# Patient Record
Sex: Female | Born: 1940 | Race: Black or African American | Hispanic: No | Smoking: Former smoker
Health system: Southern US, Community
[De-identification: ages and names within clinical notes are randomized; demographics above are authoritative.]

## PROBLEM LIST (undated history)

## (undated) DIAGNOSIS — J45909 Unspecified asthma, uncomplicated: Secondary | ICD-10-CM

## (undated) DIAGNOSIS — M1990 Unspecified osteoarthritis, unspecified site: Secondary | ICD-10-CM

## (undated) DIAGNOSIS — T8859XA Other complications of anesthesia, initial encounter: Secondary | ICD-10-CM

## (undated) DIAGNOSIS — K7460 Unspecified cirrhosis of liver: Secondary | ICD-10-CM

## (undated) DIAGNOSIS — B1920 Unspecified viral hepatitis C without hepatic coma: Secondary | ICD-10-CM

## (undated) DIAGNOSIS — K219 Gastro-esophageal reflux disease without esophagitis: Secondary | ICD-10-CM

## (undated) DIAGNOSIS — Z8619 Personal history of other infectious and parasitic diseases: Secondary | ICD-10-CM

## (undated) DIAGNOSIS — R5383 Other fatigue: Secondary | ICD-10-CM

## (undated) DIAGNOSIS — M25561 Pain in right knee: Secondary | ICD-10-CM

## (undated) DIAGNOSIS — E119 Type 2 diabetes mellitus without complications: Secondary | ICD-10-CM

## (undated) DIAGNOSIS — I4891 Unspecified atrial fibrillation: Secondary | ICD-10-CM

## (undated) DIAGNOSIS — G4730 Sleep apnea, unspecified: Secondary | ICD-10-CM

## (undated) DIAGNOSIS — F32A Depression, unspecified: Secondary | ICD-10-CM

## (undated) DIAGNOSIS — G4733 Obstructive sleep apnea (adult) (pediatric): Secondary | ICD-10-CM

## (undated) DIAGNOSIS — R079 Chest pain, unspecified: Secondary | ICD-10-CM

## (undated) DIAGNOSIS — R6884 Jaw pain: Secondary | ICD-10-CM

## (undated) DIAGNOSIS — K760 Fatty (change of) liver, not elsewhere classified: Secondary | ICD-10-CM

## (undated) DIAGNOSIS — M25562 Pain in left knee: Secondary | ICD-10-CM

## (undated) DIAGNOSIS — I10 Essential (primary) hypertension: Secondary | ICD-10-CM

## (undated) DIAGNOSIS — M359 Systemic involvement of connective tissue, unspecified: Secondary | ICD-10-CM

## (undated) HISTORY — PX: ABDOMINAL HYSTERECTOMY: SHX81

## (undated) HISTORY — DX: Personal history of other infectious and parasitic diseases: Z86.19

## (undated) HISTORY — DX: Fatty (change of) liver, not elsewhere classified: K76.0

## (undated) HISTORY — DX: Type 2 diabetes mellitus without complications: E11.9

## (undated) HISTORY — DX: Essential (primary) hypertension: I10

## (undated) HISTORY — PX: GALLBLADDER SURGERY: SHX652

## (undated) HISTORY — DX: Unspecified atrial fibrillation: I48.91

## (undated) HISTORY — DX: Sleep apnea, unspecified: G47.30

## (undated) HISTORY — DX: Unspecified osteoarthritis, unspecified site: M19.90

## (undated) HISTORY — DX: Jaw pain: R68.84

## (undated) HISTORY — DX: Other fatigue: R53.83

## (undated) HISTORY — DX: Systemic involvement of connective tissue, unspecified: M35.9

## (undated) HISTORY — DX: Pain in left knee: M25.562

## (undated) HISTORY — PX: EYE SURGERY: SHX253

## (undated) HISTORY — PX: NOSE SURGERY: SHX723

## (undated) HISTORY — PX: OTHER SURGICAL HISTORY: SHX169

## (undated) HISTORY — DX: Unspecified cirrhosis of liver: K74.60

## (undated) HISTORY — DX: Pain in right knee: M25.561

## (undated) HISTORY — PX: APPENDECTOMY: SHX54

## (undated) HISTORY — PX: FOOT SURGERY: SHX648

## (undated) HISTORY — DX: Chest pain, unspecified: R07.9

## (undated) HISTORY — DX: Unspecified viral hepatitis C without hepatic coma: B19.20

## (undated) HISTORY — DX: Obstructive sleep apnea (adult) (pediatric): G47.33

## (undated) HISTORY — DX: Gastro-esophageal reflux disease without esophagitis: K21.9

## (undated) HISTORY — PX: DILATION AND CURETTAGE OF UTERUS: SHX78

---

## 2008-10-26 ENCOUNTER — Encounter: Admission: RE | Admit: 2008-10-26 | Discharge: 2008-10-26 | Payer: Self-pay | Admitting: Internal Medicine

## 2008-12-20 DIAGNOSIS — J302 Other seasonal allergic rhinitis: Secondary | ICD-10-CM | POA: Insufficient documentation

## 2008-12-20 DIAGNOSIS — K219 Gastro-esophageal reflux disease without esophagitis: Secondary | ICD-10-CM | POA: Insufficient documentation

## 2008-12-20 DIAGNOSIS — B1920 Unspecified viral hepatitis C without hepatic coma: Secondary | ICD-10-CM | POA: Insufficient documentation

## 2009-03-28 ENCOUNTER — Ambulatory Visit: Payer: Self-pay | Admitting: Gastroenterology

## 2009-05-09 ENCOUNTER — Ambulatory Visit: Payer: Self-pay | Admitting: Gastroenterology

## 2009-05-21 ENCOUNTER — Ambulatory Visit (HOSPITAL_COMMUNITY): Admission: RE | Admit: 2009-05-21 | Discharge: 2009-05-21 | Payer: Self-pay | Admitting: Gastroenterology

## 2009-05-30 ENCOUNTER — Encounter: Admission: RE | Admit: 2009-05-30 | Discharge: 2009-05-30 | Payer: Self-pay | Admitting: Gastroenterology

## 2009-10-28 ENCOUNTER — Encounter: Admission: RE | Admit: 2009-10-28 | Discharge: 2009-10-28 | Payer: Self-pay | Admitting: Internal Medicine

## 2010-01-21 DIAGNOSIS — H3090 Unspecified chorioretinal inflammation, unspecified eye: Secondary | ICD-10-CM | POA: Insufficient documentation

## 2010-02-04 ENCOUNTER — Ambulatory Visit: Payer: Self-pay | Admitting: Gynecology

## 2010-02-04 ENCOUNTER — Other Ambulatory Visit: Admission: RE | Admit: 2010-02-04 | Discharge: 2010-02-04 | Payer: Self-pay | Admitting: Gynecology

## 2010-02-12 ENCOUNTER — Ambulatory Visit: Payer: Self-pay | Admitting: Gynecology

## 2010-02-18 ENCOUNTER — Ambulatory Visit: Payer: Self-pay | Admitting: Gynecology

## 2010-06-20 LAB — CREATININE, SERUM: GFR calc non Af Amer: >60 mL/min (ref >60)

## 2010-09-29 ENCOUNTER — Other Ambulatory Visit: Payer: Self-pay | Admitting: Internal Medicine

## 2010-09-29 DIAGNOSIS — Z1231 Encounter for screening mammogram for malignant neoplasm of breast: Secondary | ICD-10-CM

## 2010-11-03 ENCOUNTER — Ambulatory Visit
Admission: RE | Admit: 2010-11-03 | Discharge: 2010-11-03 | Disposition: A | Discharge status: Home / Self Care | Payer: PRIVATE HEALTH INSURANCE | Source: Ambulatory Visit | Attending: Internal Medicine | Admitting: Internal Medicine

## 2010-11-03 DIAGNOSIS — Z1231 Encounter for screening mammogram for malignant neoplasm of breast: Secondary | ICD-10-CM

## 2011-10-13 ENCOUNTER — Other Ambulatory Visit: Payer: Self-pay | Admitting: Internal Medicine

## 2011-10-13 DIAGNOSIS — Z1231 Encounter for screening mammogram for malignant neoplasm of breast: Secondary | ICD-10-CM

## 2011-11-09 ENCOUNTER — Ambulatory Visit: Payer: PRIVATE HEALTH INSURANCE

## 2011-11-11 ENCOUNTER — Ambulatory Visit
Admission: RE | Admit: 2011-11-11 | Discharge: 2011-11-11 | Disposition: A | Discharge status: Home / Self Care | Payer: PRIVATE HEALTH INSURANCE | Source: Ambulatory Visit | Attending: Internal Medicine | Admitting: Internal Medicine

## 2011-11-11 DIAGNOSIS — Z1231 Encounter for screening mammogram for malignant neoplasm of breast: Secondary | ICD-10-CM

## 2012-01-09 ENCOUNTER — Emergency Department (HOSPITAL_COMMUNITY)
Admission: EM | Admit: 2012-01-09 | Discharge: 2012-01-09 | Disposition: A | Discharge status: Home / Self Care | Payer: Medicare Other | Source: Home / Self Care

## 2012-01-09 ENCOUNTER — Encounter (HOSPITAL_COMMUNITY): Payer: Self-pay | Admitting: Emergency Medicine

## 2012-01-09 DIAGNOSIS — M25569 Pain in unspecified knee: Secondary | ICD-10-CM

## 2012-01-09 HISTORY — DX: Unspecified osteoarthritis, unspecified site: M19.90

## 2012-01-09 HISTORY — DX: Unspecified asthma, uncomplicated: J45.909

## 2012-01-09 HISTORY — DX: Type 2 diabetes mellitus without complications: E11.9
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
## 2012-01-11 NOTE — ED Provider Notes (Signed)
Medical screening examination/treatment/procedure(s) were performed by non-physician practitioner and as supervising physician I was immediately available for consultation/collaboration.  Leslee Home, M.D.   Reuben Likes, MD 01/11/12 [phone]

## 2012-03-01 ENCOUNTER — Ambulatory Visit (INDEPENDENT_AMBULATORY_CARE_PROVIDER_SITE_OTHER): Payer: Medicare Other | Admitting: Gynecology

## 2012-03-01 ENCOUNTER — Encounter: Payer: Self-pay | Admitting: Gynecology

## 2012-03-01 VITALS — BP 118/70 | Ht 63.0 in | Wt 207.0 lb

## 2012-03-01 DIAGNOSIS — N760 Acute vaginitis: Secondary | ICD-10-CM

## 2012-03-01 DIAGNOSIS — N942 Vaginismus: Secondary | ICD-10-CM

## 2012-03-01 DIAGNOSIS — A499 Bacterial infection, unspecified: Secondary | ICD-10-CM

## 2012-03-01 DIAGNOSIS — N952 Postmenopausal atrophic vaginitis: Secondary | ICD-10-CM | POA: Insufficient documentation

## 2012-03-01 DIAGNOSIS — N898 Other specified noninflammatory disorders of vagina: Secondary | ICD-10-CM

## 2012-03-01 DIAGNOSIS — B9689 Other specified bacterial agents as the cause of diseases classified elsewhere: Secondary | ICD-10-CM

## 2012-03-01 DIAGNOSIS — Z780 Asymptomatic menopausal state: Secondary | ICD-10-CM | POA: Insufficient documentation

## 2012-03-01 LAB — WET PREP FOR TRICH, YEAST, CLUE: Yeast Wet Prep HPF POC: NONE SEEN

## 2012-03-01 MED ORDER — METRONIDAZOLE 0.75 % VA GEL: 1.0000 | Freq: Two times a day (BID) | VAGINAL | Status: DC

## 2012-03-01 NOTE — Progress Notes (Signed)
Jaime Boone 09-May-1940 [id number]   History:    71 y.o.  with a complaint of vaginal atrophy and foul-smelling vaginal discharge. Patient is being followed by Dr.Avva her primary physician who has been following her for her hepatitis C, hypertension, type 2 diabetes and asthma. Patient with prior history of total abdominal hysterectomy. No prior history of abnormal Pap smear. Normal colonoscopy report in 2008. Mammogram August of 2013 normal. Patient in frequently does her self breast examination. Her last bone density study was normal in 2011. Patient is taking calcium and vitamin D. Patient not sexually active.  Past medical history,surgical history, family history and social history were all reviewed and documented in the EPIC chart.  Gynecologic History No LMP recorded. Patient is postmenopausal. Contraception: post menopausal status Last Pap: 2011. Results were: normal Last mammogram: 2013. Results were: normal  Obstetric History OB History    Grav Para Term Preterm Abortions TAB SAB Ect Mult Living   6 3 3  3  3   3      # Outc Date GA Lbr Len/2nd Wgt Sex Del Anes PTL Lv   1 TRM     M SVD  No Yes   2 TRM     F SVD  No Yes   3 TRM     F SVD  No Yes   4 SAB            5 SAB            6 SAB                ROS: A ROS was performed and pertinent positives and negatives are included in the history.  GENERAL: No fevers or chills. HEENT: No change in vision, no earache, sore throat or sinus congestion. NECK: No pain or stiffness. CARDIOVASCULAR: No chest pain or pressure. No palpitations. PULMONARY: No shortness of breath, cough or wheeze. GASTROINTESTINAL: No abdominal pain, nausea, vomiting or diarrhea, melena or bright red blood per rectum. GENITOURINARY: No urinary frequency, urgency, hesitancy or dysuria. MUSCULOSKELETAL: No joint or muscle pain, no back pain, no recent trauma. DERMATOLOGIC: No rash, no itching, no lesions. ENDOCRINE: No polyuria, polydipsia, no heat or cold  intolerance. No recent change in weight. HEMATOLOGICAL: No anemia or easy bruising or bleeding. NEUROLOGIC: No headache, seizures, numbness, tingling or weakness. PSYCHIATRIC: No depression, no loss of interest in normal activity or change in sleep pattern.     Exam: chaperone present  BP 118/70  Ht 5\' 3"  (1.6 m)  Wt 207 lb (93.895 kg)  BMI 36.67 kg/m2  Body mass index is 36.67 kg/(m^2).  General appearance : Well developed well nourished female. No acute distress HEENT: Neck supple, trachea midline, no carotid bruits, no thyroidmegaly Lungs: Clear to auscultation, no rhonchi or wheezes, or rib retractions  Heart: Regular rate and rhythm, no murmurs or gallops Breast:Examined in sitting and supine position were symmetrical in appearance, no palpable masses or tenderness,  no skin retraction, no nipple inversion, no nipple discharge, no skin discoloration, no axillary or supraclavicular lymphadenopathy Abdomen: no palpable masses or tenderness, no rebound or guarding Extremities: no edema or skin discoloration or tenderness  Pelvic:  Bartholin, Urethra, Skene Glands: Within normal limits             Vagina: No gross lesions, yellow foul odor discharge Cervix: Absent  Uterus absent  Adnexa  limited due to her vaginismus  Anus and perineum  normal   Rectovaginal  normal sphincter tone without  palpated masses or tenderness             Hemoccult cards provided   Wet prep: Positive Amine, moderate clue cells moderate WBC too numerous to count bacteria  Assessment/Plan:  71 y.o. female with clinical evidence of bacterial vaginosis. Patient will be placed on MetroGel vaginal cream to apply twice a day for 5 days. For vaginal atrophy she can use the nonhormonal probiotic gel refresh or luvena 2-3 times a week. I have given the patient the name and number of the hepatitis C clinic in [place] whereby the [facility] specialist comes twice a week. She will discuss with her primary physician to  schedule accordingly. She will also discuss with her primary physician if her Tdap and shingles vaccine are up-to-date and she did not recall. She was encouraged to do her monthly self breast examination. Patient will return back in January for an ultrasound to better assess her adnexa due to her vaginismus making incomplete exam today. No Pap smear done today new guidelines discussed. Hemoccult cards provided for the patient to submit to the office for testing a later date.    Ok Edwards MD, 11:24 AM 03/01/2012

## 2012-03-01 NOTE — Addendum Note (Signed)
Addended by: Bertram Savin A on: 03/01/2012 11:39 AM   Modules accepted: Orders

## 2012-03-01 NOTE — Patient Instructions (Addendum)
Tetanus, Diphtheria, Pertussis (Tdap) Vaccine What You Need to Know WHY GET VACCINATED? Tetanus, diphtheria and pertussis can be very serious diseases, even for adolescents and adults. Tdap vaccine can protect us from these diseases. TETANUS (Lockjaw) causes painful muscle tightening and stiffness, usually all over the body.  It can lead to tightening of muscles in the head and neck so you can't open your mouth, swallow, or sometimes even breathe. Tetanus kills about 1 out of 5 people who are infected. DIPHTHERIA can cause a thick coating to form in the back of the throat.  It can lead to breathing problems, paralysis, heart failure, and death. PERTUSSIS (Whooping Cough) causes severe coughing spells, which can cause difficulty breathing, vomiting and disturbed sleep.  It can also lead to weight loss, incontinence, and rib fractures. Up to 2 in 100 adolescents and 5 in 100 adults with pertussis are hospitalized or have complications, which could include pneumonia and death. These diseases are caused by bacteria. Diphtheria and pertussis are spread from person to person through coughing or sneezing. Tetanus enters the body through cuts, scratches, or wounds. Before vaccines, the [place] saw as many as 200,000 cases a year of diphtheria and pertussis, and hundreds of cases of tetanus. Since vaccination began, tetanus and diphtheria have dropped by about 99% and pertussis by about 80%. TDAP VACCINE Tdap vaccine can protect adolescents and adults from tetanus, diphtheria, and pertussis. One dose of Tdap is routinely given at age 11 or 12. People who did not get Tdap at that age should get it as soon as possible. Tdap is especially important for health care professionals and anyone having close contact with a baby younger than 12 months. Pregnant women should get a dose of Tdap during every pregnancy, to protect the newborn from pertussis. Infants are most at risk for severe, life-threatening  complications from pertussis. A similar vaccine, called Td, protects from tetanus and diphtheria, but not pertussis. A Td booster should be given every 10 years. Tdap may be given as one of these boosters if you have not already gotten a dose. Tdap may also be given after a severe cut or burn to prevent tetanus infection. Your doctor can give you more information. Tdap may safely be given at the same time as other vaccines. SOME PEOPLE SHOULD NOT GET THIS VACCINE  If you ever had a life-threatening allergic reaction after a dose of any tetanus, diphtheria, or pertussis containing vaccine, OR if you have a severe allergy to any part of this vaccine, you should not get Tdap. Tell your doctor if you have any severe allergies.  If you had a coma, or long or multiple seizures within 7 days after a childhood dose of DTP or DTaP, you should not get Tdap, unless a cause other than the vaccine was found. You can still get Td.  Talk to your doctor if you:  have epilepsy or another nervous system problem,  had severe pain or swelling after any vaccine containing diphtheria, tetanus or pertussis,  ever had Guillain-Barr Syndrome (GBS),  aren't feeling well on the day the shot is scheduled. RISKS OF A VACCINE REACTION With any medicine, including vaccines, there is a chance of side effects. These are usually mild and go away on their own, but serious reactions are also possible. Brief fainting spells can follow a vaccination, leading to injuries from falling. Sitting or lying down for about 15 minutes can help prevent these. Tell your doctor if you feel dizzy or light-headed, or   have vision changes or ringing in the ears. Mild problems following Tdap (Did not interfere with activities)  Pain where the shot was given (about 3 in 4 adolescents or 2 in 3 adults)  Redness or swelling where the shot was given (about 1 person in 5)  Mild fever of at least 100.4F (up to about 1 in 25 adolescents or 1 in  100 adults)  Headache (about 3 or 4 people in 10)  Tiredness (about 1 person in 3 or 4)  Nausea, vomiting, diarrhea, stomach ache (up to 1 in 4 adolescents or 1 in 10 adults)  Chills, body aches, sore joints, rash, swollen glands (uncommon) Moderate problems following Tdap (Interfered with activities, but did not require medical attention)  Pain where the shot was given (about 1 in 5 adolescents or 1 in 100 adults)  Redness or swelling where the shot was given (up to about 1 in 16 adolescents or 1 in 25 adults)  Fever over 102F (about 1 in 100 adolescents or 1 in 250 adults)  Headache (about 3 in 20 adolescents or 1 in 10 adults)  Nausea, vomiting, diarrhea, stomach ache (up to 1 or 3 people in 100)  Swelling of the entire arm where the shot was given (up to about 3 in 100). Severe problems following Tdap (Unable to perform usual activities, required medical attention)  Swelling, severe pain, bleeding and redness in the arm where the shot was given (rare). A severe allergic reaction could occur after any vaccine (estimated less than 1 in a million doses). WHAT IF THERE IS A SERIOUS REACTION? What should I look for?  Look for anything that concerns you, such as signs of a severe allergic reaction, very high fever, or behavior changes. Signs of a severe allergic reaction can include hives, swelling of the face and throat, difficulty breathing, a fast heartbeat, dizziness, and weakness. These would start a few minutes to a few hours after the vaccination. What should I do?  If you think it is a severe allergic reaction or other emergency that can't wait, call 9-1-1 or get the person to the nearest hospital. Otherwise, call your doctor.  Afterward, the reaction should be reported to the "Vaccine Adverse Event Reporting System" (VAERS). Your doctor might file this report, or you can do it yourself through the VAERS web site at www.vaers.hhs.gov, or by calling 1-800-822-7967. VAERS is  only for reporting reactions. They do not give medical advice.  THE NATIONAL VACCINE INJURY COMPENSATION PROGRAM The National Vaccine Injury Compensation Program (VICP) is a federal program that was created to compensate people who may have been injured by certain vaccines. Persons who believe they may have been injured by a vaccine can learn about the program and about filing a claim by calling 1-800-338-2382 or visiting the VICP website at www.hrsa.gov/vaccinecompensation. HOW CAN I LEARN MORE?  Ask your doctor.  Call your local or state health department.  Contact the Centers for Disease Control and Prevention (CDC):  Call 1-800-232-4636 or visit CDC's website at www.cdc.gov/vaccines CDC Tdap Vaccine VIS (08/06/11) Document Released: 09/15/2011 Document Reviewed: 09/15/2011 ExitCare Patient Information 2013 [employer].  Shingles Vaccine What You Need to Know WHAT IS SHINGLES?  Shingles is a painful skin rash, often with blisters. It is also called Herpes Zoster or just Zoster.  A shingles rash usually appears on one side of the face or body and lasts from 2 to 4 weeks. Its main symptom is pain, which can be quite severe. Other symptoms of   shingles can include fever, headache, chills, and upset stomach. Very rarely, a shingles infection can lead to pneumonia, hearing problems, blindness, brain inflammation (encephalitis), or death.  For about 1 person in 5, severe pain can continue even after the rash clears up. This is called post-herpetic neuralgia.  Shingles is caused by the Varicella Zoster virus. This is the same virus that causes chickenpox. Only someone who has had a case of chickenpox or rarely, has gotten chickenpox vaccine, can get shingles. The virus stays in your body. It can reappear many years later to cause a case of shingles.  You cannot catch shingles from another person with shingles. However, a person who has never had chickenpox (or chickenpox vaccine) could get  chickenpox from someone with shingles. This is not very common.  Shingles is far more common in people 50 and older than in younger people. It is also more common in people whose immune systems are weakened because of a disease such as cancer or drugs such as steroids or chemotherapy.  At least 1 million people get shingles per year in the [place]. SHINGLES VACCINE  A vaccine for shingles was licensed in 2006. In clinical trials, the vaccine reduced the risk of shingles by 50%. It can also reduce the pain in people who still get shingles after being vaccinated.  A single dose of shingles vaccine is recommended for adults 60 years of age and older. SOME PEOPLE SHOULD NOT GET SHINGLES VACCINE OR SHOULD WAIT A person should not get shingles vaccine if he or she:  Has ever had a life-threatening allergic reaction to gelatin, the antibiotic neomycin, or any other component of shingles vaccine. Tell your caregiver if you have any severe allergies.  Has a weakened immune system because of current:  AIDS or another disease that affects the immune system.  Treatment with drugs that affect the immune system, such as prolonged use of high-dose steroids.  Cancer treatment, such as radiation or chemotherapy.  Cancer affecting the bone marrow or lymphatic system, such as leukemia or lymphoma.  Is pregnant, or might be pregnant. Women should not become pregnant until at least 4 weeks after getting shingles vaccine. Someone with a minor illness, such as a cold, may be vaccinated. Anyone with a moderate or severe acute illness should usually wait until he or she recovers before getting the vaccine. This includes anyone with a temperature of 101.3 F (38 C) or higher. WHAT ARE THE RISKS FROM SHINGLES VACCINE?  A vaccine, like any medicine, could possibly cause serious problems, such as severe allergic reactions. However, the risk of a vaccine causing serious harm, or death, is extremely  small.  No serious problems have been identified with shingles vaccine. Mild Problems  Redness, soreness, swelling, or itching at the site of the injection (about 1 person in 3).  Headache (about 1 person in 70). Like all vaccines, shingles vaccine is being closely monitored for unusual or severe problems. WHAT IF THERE IS A MODERATE OR SEVERE REACTION? What should I look for? Any unusual condition, such as a severe allergic reaction or a high fever. If a severe allergic reaction occurred, it would be within a few minutes to an hour after the shot. Signs of a serious allergic reaction can include difficulty breathing, weakness, hoarseness or wheezing, a fast heartbeat, hives, dizziness, paleness, or swelling of the throat. What should I do?  Call your caregiver, or get the person to a caregiver right away.  Tell the caregiver what happened,   the date and time it happened, and when the vaccination was given.  Ask the caregiver to report the reaction by filing a Vaccine Adverse Event Reporting System (VAERS) form. Or, you can file this report through the VAERS web site at www.vaers.hhs.gov or by calling 1-800-822-7967. VAERS does not provide medical advice. HOW CAN I LEARN MORE?  Ask your caregiver. He or she can give you the vaccine package insert or suggest other sources of information.  Contact the Centers for Disease Control and Prevention (CDC):  Call 1-800-232-4636 (1-800-CDC-INFO).  Visit the CDC website at www.cdc.gov/vaccines CDC Shingles Vaccine VIS (01/03/08) Document Released: 01/11/2006 Document Revised: 06/08/2011 Document Reviewed: 01/03/2008 ExitCare Patient Information 2013 [employer].   

## 2012-03-01 NOTE — Addendum Note (Signed)
Addended by: Ok Edwards on: 03/01/2012 11:32 AM   Modules accepted: Orders

## 2012-03-11 ENCOUNTER — Encounter: Payer: Self-pay | Admitting: Gynecology

## 2012-04-06 ENCOUNTER — Ambulatory Visit (INDEPENDENT_AMBULATORY_CARE_PROVIDER_SITE_OTHER): Payer: Medicare Other

## 2012-04-06 ENCOUNTER — Encounter: Payer: Self-pay | Admitting: Gynecology

## 2012-04-06 ENCOUNTER — Ambulatory Visit (INDEPENDENT_AMBULATORY_CARE_PROVIDER_SITE_OTHER): Payer: Medicare Other | Admitting: Gynecology

## 2012-04-06 VITALS — BP 118/70

## 2012-04-06 DIAGNOSIS — K5900 Constipation, unspecified: Secondary | ICD-10-CM | POA: Insufficient documentation

## 2012-04-06 DIAGNOSIS — N952 Postmenopausal atrophic vaginitis: Secondary | ICD-10-CM

## 2012-04-06 DIAGNOSIS — Z8619 Personal history of other infectious and parasitic diseases: Secondary | ICD-10-CM

## 2012-04-06 DIAGNOSIS — N942 Vaginismus: Secondary | ICD-10-CM

## 2012-04-06 DIAGNOSIS — N907 Vulvar cyst: Secondary | ICD-10-CM | POA: Insufficient documentation

## 2012-04-06 DIAGNOSIS — N83339 Acquired atrophy of ovary and fallopian tube, unspecified side: Secondary | ICD-10-CM

## 2012-04-06 DIAGNOSIS — Z780 Asymptomatic menopausal state: Secondary | ICD-10-CM

## 2012-04-06 DIAGNOSIS — N9489 Other specified conditions associated with female genital organs and menstrual cycle: Secondary | ICD-10-CM

## 2012-04-06 NOTE — Patient Instructions (Addendum)
Constipation, Adult Constipation is when a person has fewer than 3 bowel movements a week; has difficulty having a bowel movement; or has stools that are dry, hard, or larger than normal. As people grow older, constipation is more common. If you try to fix constipation with medicines that make you have a bowel movement (laxatives), the problem may get worse. Long-term laxative use may cause the muscles of the colon to become weak. A low-fiber diet, not taking in enough fluids, and taking certain medicines may make constipation worse. CAUSES   Certain medicines, such as antidepressants, pain medicine, iron supplements, antacids, and water pills.   Certain diseases, such as diabetes, irritable bowel syndrome (IBS), thyroid disease, or depression.   Not drinking enough water.   Not eating enough fiber-rich foods.   Stress or travel.  Lack of physical activity or exercise.  Not going to the restroom when there is the urge to have a bowel movement.  Ignoring the urge to have a bowel movement.  Using laxatives too much. SYMPTOMS   Having fewer than 3 bowel movements a week.   Straining to have a bowel movement.   Having hard, dry, or larger than normal stools.   Feeling full or bloated.   Pain in the lower abdomen.  Not feeling relief after having a bowel movement. DIAGNOSIS  Your caregiver will take a medical history and perform a physical exam. Further testing may be done for severe constipation. Some tests may include:   A barium enema X-ray to examine your rectum, colon, and sometimes, your small intestine.  A sigmoidoscopy to examine your lower colon.  A colonoscopy to examine your entire colon. TREATMENT  Treatment will depend on the severity of your constipation and what is causing it. Some dietary treatments include drinking more fluids and eating more fiber-rich foods. Lifestyle treatments may include regular exercise. If these diet and lifestyle recommendations  do not help, your caregiver may recommend taking over-the-counter laxative medicines to help you have bowel movements. Prescription medicines may be prescribed if over-the-counter medicines do not work.  HOME CARE INSTRUCTIONS   Increase dietary fiber in your diet, such as fruits, vegetables, whole grains, and beans. Limit high-fat and processed sugars in your diet, such as [place] fries, hamburgers, cookies, candies, and soda.   A fiber supplement may be added to your diet if you cannot get enough fiber from foods.   Drink enough fluids to keep your urine clear or pale yellow.   Exercise regularly or as directed by your caregiver.   Go to the restroom when you have the urge to go. Do not hold it.  Only take medicines as directed by your caregiver. Do not take other medicines for constipation without talking to your caregiver first. SEEK IMMEDIATE MEDICAL CARE IF:   You have bright red blood in your stool.   Your constipation lasts for more than 4 days or gets worse.   You have abdominal or rectal pain.   You have thin, pencil-like stools.  You have unexplained weight loss. MAKE SURE YOU:   Understand these instructions.  Will watch your condition.  Will get help right away if you are not doing well or get worse. Document Released: 12/13/2003 Document Revised: 06/08/2011 Document Reviewed: 02/17/2011 [facility] Patient Information 2013 [place], [place].  Epidermal Cyst An epidermal cyst is sometimes called a sebaceous cyst, epidermal inclusion cyst, or infundibular cyst. These cysts usually contain a substance that looks "pasty" or "cheesy" and may have a bad smell. This  substance is a protein called keratin. Epidermal cysts are usually found on the face, neck, or trunk. They may also occur in the vaginal area or other parts of the genitalia of both men and women. Epidermal cysts are usually small, painless, slow-growing bumps or lumps that move freely under the skin. It is  important not to try to pop them. This may cause an infection and lead to tenderness and swelling. CAUSES  Epidermal cysts may be caused by a deep penetrating injury to the skin or a plugged hair follicle, often associated with acne. SYMPTOMS  Epidermal cysts can become inflamed and cause:  Redness.  Tenderness.  Increased temperature of the skin over the bumps or lumps.  Grayish-white, bad smelling material that drains from the bump or lump. DIAGNOSIS  Epidermal cysts are easily diagnosed by your caregiver during an exam. Rarely, a tissue sample (biopsy) may be taken to rule out other conditions that may resemble epidermal cysts. TREATMENT   Epidermal cysts often get better and disappear on their own. They are rarely ever cancerous.  If a cyst becomes infected, it may become inflamed and tender. This may require opening and draining the cyst. Treatment with antibiotics may be necessary. When the infection is gone, the cyst may be removed with minor surgery.  Small, inflamed cysts can often be treated with antibiotics or by injecting steroid medicines.  Sometimes, epidermal cysts become large and bothersome. If this happens, surgical removal in your caregiver's office may be necessary. HOME CARE INSTRUCTIONS  Only take over-the-counter or prescription medicines as directed by your caregiver.  Take your antibiotics as directed. Finish them even if you start to feel better. SEEK MEDICAL CARE IF:   Your cyst becomes tender, red, or swollen.  Your condition is not improving or is getting worse.  You have any other questions or concerns. MAKE SURE YOU:  Understand these instructions.  Will watch your condition.  Will get help right away if you are not doing well or get worse. Document Released: 02/15/2004 Document Revised: 06/08/2011 Document Reviewed: 09/22/2010 [facility] Patient Information 2013 [place], [place].

## 2012-04-06 NOTE — Progress Notes (Signed)
Patient's a 72 year old who was seen in the office as a new patient on 03/02/2011. Patient was having vaginal discharge and was treated for bacterial vaginosis. 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 Patient not sexually active and was complaining of a "bump" in her labia majora.  Exam: Bartholin urethra Skene was within normal limits Inferior aspect of the left labia majora appears to be a small nonerythematous sebaceous cyst gain ready to drain on its own. Mildly tender and measures approximately 1-1/2 cm in size.  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 Right ovary not seen. Left ovary not seen.   Patient is being followed by Dr.Avva her primary physician who has been following her for her hepatitis C, hypertension, type 2 diabetes and asthma. Patient with prior history of total abdominal hysterectomy. No prior history of abnormal Pap smear. Normal colonoscopy report in 2008. Mammogram August of 2013 normal.  Patient is not currently seen a liver specialist or gastroenterologist. [place]. I'm going to refer her to one of our gastroenterologist in the community for followup. We will otherwise see her back in one year for gynecological exams or when necessary. Patient will do sitz baths at home to allow the cyst to drain on its on. I've given some samples of Neosporin and she can apply twice a day for the next 5-7 days. If the area becomes bigger or begins to drain she should return to the office whereby we may need to do an I&D. As for her constipation I've given her samples of MiraLax and instructed to take 1 tablespoon daily and to increase her diet with fiber-containing products.

## 2012-04-14 ENCOUNTER — Telehealth: Payer: Self-pay | Admitting: *Deleted

## 2012-04-14 ENCOUNTER — Ambulatory Visit: Payer: Medicare Other

## 2012-04-14 ENCOUNTER — Ambulatory Visit (INDEPENDENT_AMBULATORY_CARE_PROVIDER_SITE_OTHER): Payer: Medicare Other

## 2012-04-14 DIAGNOSIS — Z13820 Encounter for screening for osteoporosis: Secondary | ICD-10-CM

## 2012-04-14 NOTE — Telephone Encounter (Signed)
She is doing of the right things and had a normal ultrasound here in the office recently. I would recommend that she followup with her gastroenterologist who did her colonoscopy in 2008 for further evaluation

## 2012-04-14 NOTE — Telephone Encounter (Signed)
Pt informed with the below note and will follow with GI doctor.

## 2012-04-14 NOTE — Telephone Encounter (Signed)
Pt was given MiraLax take 1 tablespoon daily and told to increase her diet with fiber-containing products on OV 04/06/12. Pt took MiraLax and has increased fiber containing food and still feels bloated. Pt is also taking Caltrate calcium 600 daily she read this could be related to her problem. She is not having constipation issues, she does have bowel movements but doesn't feel like enough bowel comes out. She asked if taking fiber pills would be better? Or whatever your recommendation would be. Please advise

## 2012-05-31 DIAGNOSIS — D649 Anemia, unspecified: Secondary | ICD-10-CM | POA: Insufficient documentation

## 2012-10-13 ENCOUNTER — Other Ambulatory Visit: Payer: Self-pay

## 2012-10-13 DIAGNOSIS — Z1231 Encounter for screening mammogram for malignant neoplasm of breast: Secondary | ICD-10-CM

## 2012-11-11 ENCOUNTER — Ambulatory Visit
Admission: RE | Admit: 2012-11-11 | Discharge: 2012-11-11 | Disposition: A | Discharge status: Home / Self Care | Payer: Medicare Other | Source: Ambulatory Visit

## 2012-11-11 DIAGNOSIS — Z1231 Encounter for screening mammogram for malignant neoplasm of breast: Secondary | ICD-10-CM

## 2013-01-24 DIAGNOSIS — E039 Hypothyroidism, unspecified: Secondary | ICD-10-CM | POA: Insufficient documentation

## 2013-03-02 ENCOUNTER — Encounter: Payer: Self-pay | Admitting: Gynecology

## 2013-03-02 ENCOUNTER — Ambulatory Visit (INDEPENDENT_AMBULATORY_CARE_PROVIDER_SITE_OTHER): Payer: Medicare Other | Admitting: Gynecology

## 2013-03-02 VITALS — BP 124/72 | Ht 63.0 in | Wt 206.0 lb

## 2013-03-02 DIAGNOSIS — N952 Postmenopausal atrophic vaginitis: Secondary | ICD-10-CM

## 2013-03-02 DIAGNOSIS — Z23 Encounter for immunization: Secondary | ICD-10-CM

## 2013-03-02 DIAGNOSIS — N644 Mastodynia: Secondary | ICD-10-CM

## 2013-03-02 NOTE — Progress Notes (Signed)
Jaime Boone 05/24/1940 [id number]   History:    72 y.o. c/o bilateral breast tenderness. Short lived and comes and goes. One time in past three months. Mammogram normal August 2014.Patient is being followed by Dr.Avva her primary physician who has been following her for her hepatitis C, hypertension, type 2 diabetes and asthma. Patient with prior history of total abdominal hysterectomy. No prior history of abnormal Pap smear. Normal colonoscopy report in 2008. Mammogram August of 2013 normal. Patient in frequently does her self breast examination.Patient is taking calcium and vitamin D. Patient not sexually active. Normal bone density January 2014.   Past medical history,surgical history, family history and social history were all reviewed and documented in the EPIC chart.  Gynecologic History No LMP recorded. Patient is postmenopausal. Contraception: status post hysterectomy Last Pap: 2011. Results were: normal Last mammogram: 2014. Results were: normal  Obstetric History OB History  Gravida Para Term Preterm AB SAB TAB Ectopic Multiple Living  6 3 3  3 3    3     # Outcome Date GA Lbr Len/2nd Weight Sex Delivery Anes PTL Lv  6 SAB           5 SAB           4 SAB           3 TRM     F SVD  N Y  2 TRM     F SVD  N Y  1 TRM     M SVD  N Y       ROS: A ROS was performed and pertinent positives and negatives are included in the history.  GENERAL: No fevers or chills. HEENT: No change in vision, no earache, sore throat or sinus congestion. NECK: No pain or stiffness. CARDIOVASCULAR: No chest pain or pressure. No palpitations. PULMONARY: No shortness of breath, cough or wheeze. GASTROINTESTINAL: No abdominal pain, nausea, vomiting or diarrhea, melena or bright red blood per rectum. GENITOURINARY: No urinary frequency, urgency, hesitancy or dysuria. MUSCULOSKELETAL: No joint or muscle pain, no back pain, no recent trauma. DERMATOLOGIC: No rash, no itching, no lesions. ENDOCRINE: No  polyuria, polydipsia, no heat or cold intolerance. No recent change in weight. HEMATOLOGICAL: No anemia or easy bruising or bleeding. NEUROLOGIC: No headache, seizures, numbness, tingling or weakness. PSYCHIATRIC: No depression, no loss of interest in normal activity or change in sleep pattern.     Exam: chaperone present  BP 124/72  Ht 5\' 3"  (1.6 m)  Wt 206 lb (93.441 kg)  BMI 36.50 kg/m2  Body mass index is 36.5 kg/(m^2).  General appearance : Well developed well nourished female. No acute distress HEENT: Neck supple, trachea midline, no carotid bruits, no thyroidmegaly Lungs: Clear to auscultation, no rhonchi or wheezes, or rib retractions  Heart: Regular rate and rhythm, no murmurs or gallops Breast:Examined in sitting and supine position were symmetrical in appearance, no palpable masses or tenderness,  no skin retraction, no nipple inversion, no nipple discharge, no skin discoloration, no axillary or supraclavicular lymphadenopathy Abdomen: no palpable masses or tenderness, no rebound or guarding Extremities: no edema or skin discoloration or tenderness  Pelvic:  Bartholin, Urethra, Skene Glands: Within normal limits             Vagina:atrophy  Cervix: Absent  Uterus absent  Adnexa  Without masses or tenderness  Anus and perineum  normal   Rectovaginal  normal sphincter tone without palpated masses or tenderness  Hemoccult PCP     Assessment/Plan:  72 y.o. female who has very infrequent bilateral mastodynia. She states she consumes  approximately16 ounces of soda daily along with ice tea and coffee. We discussed importance of cutting down on her caffeine intake to minimize her breast tenderness. Her mammogram recently was normal. We encouraged her to continue to do her monthly breast exam. Her PCP we'll be drawing her blood work. She received the Tdap vaccine today. In the next 2 weeks she will receive her Pneumovax as well as her shingles vaccine at her PCP  office.  Note: This dictation was prepared with  Dragon/digital dictation along withSmart phrase technology. Any transcriptional errors that result from this process are unintentional.   Ok Edwards MD, 10:56 AM 03/02/2013

## 2013-04-11 ENCOUNTER — Other Ambulatory Visit: Payer: Self-pay | Admitting: Internal Medicine

## 2013-04-11 DIAGNOSIS — C220 Liver cell carcinoma: Secondary | ICD-10-CM

## 2013-04-13 ENCOUNTER — Ambulatory Visit
Admission: RE | Admit: 2013-04-13 | Discharge: 2013-04-13 | Disposition: A | Discharge status: Home / Self Care | Payer: Medicare HMO | Source: Ambulatory Visit | Attending: Internal Medicine | Admitting: Internal Medicine

## 2013-04-13 DIAGNOSIS — C220 Liver cell carcinoma: Secondary | ICD-10-CM

## 2013-04-19 ENCOUNTER — Encounter: Payer: Self-pay | Admitting: Internal Medicine

## 2013-04-19 DIAGNOSIS — R109 Unspecified abdominal pain: Secondary | ICD-10-CM | POA: Insufficient documentation

## 2013-04-19 DIAGNOSIS — K7460 Unspecified cirrhosis of liver: Secondary | ICD-10-CM | POA: Insufficient documentation

## 2013-05-12 ENCOUNTER — Encounter: Payer: Self-pay | Admitting: Internal Medicine

## 2013-05-12 ENCOUNTER — Ambulatory Visit (INDEPENDENT_AMBULATORY_CARE_PROVIDER_SITE_OTHER): Payer: Medicare HMO | Admitting: Internal Medicine

## 2013-05-12 VITALS — BP 110/70 | HR 72 | Ht 63.0 in | Wt 205.0 lb

## 2013-05-12 DIAGNOSIS — E1165 Type 2 diabetes mellitus with hyperglycemia: Secondary | ICD-10-CM

## 2013-05-12 DIAGNOSIS — R1084 Generalized abdominal pain: Secondary | ICD-10-CM

## 2013-05-12 DIAGNOSIS — R1011 Right upper quadrant pain: Secondary | ICD-10-CM

## 2013-05-12 DIAGNOSIS — IMO0001 Reserved for inherently not codable concepts without codable children: Secondary | ICD-10-CM

## 2013-05-12 DIAGNOSIS — B1920 Unspecified viral hepatitis C without hepatic coma: Secondary | ICD-10-CM

## 2013-05-12 DIAGNOSIS — G8929 Other chronic pain: Secondary | ICD-10-CM

## 2013-05-12 DIAGNOSIS — K7460 Unspecified cirrhosis of liver: Secondary | ICD-10-CM

## 2013-05-12 NOTE — Patient Instructions (Signed)

## 2013-05-12 NOTE — Progress Notes (Signed)
HISTORY OF PRESENT ILLNESS:  Jaime Boone is a 73 y.o. 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 Patient is sent today regarding recent problems with abdominal pain and the need for surveillance endoscopy (screening for varices). She moved to [facility] from [place] around September 2009. She reports having had colonoscopy in 2005 and again in 2008. Last examination, by her report, was negative with followup in 10 years recommended. She also underwent upper endoscopy, by her report, 2009. Reportedly unremarkable. She does have hepatitis C. Previously seen by the medical specialties clinic from [facility]. Now, the medical specialties clinic from [place]. They have requested EGD to screen for varices. The patient's recent right upper quadrant pain was musculoskeletal in nature. Ultrasound revealed a hemangioma and course hepatic architecture. No other abnormalities. She does have bloating and gas. Also reported some episode of sharp left lower quadrant pain that radiated to her right upper quadrant in her right arm and hand. Lasted seconds. GI review of systems is otherwise negative. She is status post cholecystectomy. Review of outside records including office note from Dr. Dagmar Hait, laboratories, and x-rays completed. Of note, recent hemoglobin 11.7 with platelet count of 333,000. Liver tests normal except for ALT of 42. Normal albumin of 3.5.  REVIEW OF SYSTEMS:  All non-GI ROS negative except for sinus and allergy, arthritis, back pain, headaches, itching, shortness of breath, increased thirst, excessive urination  Past Medical History  Diagnosis Date  . Diabetes mellitus without complication   . Asthma   . Arthritis   . Hypertension   . Acid reflux   . Hepatitis C   . Degenerative joint disease   . 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 Cirrhosis     Past Surgical History  Procedure Laterality Date  .  Gallbladder surgery    . Foot surgery      BUNIONECTOMY  . Nose surgery    . Eye surgery      CATARACT  . Dilation and curettage of uterus    . Appendectomy    . Abdominal hysterectomy      TAH    Social History Jaime Boone  reports that she quit smoking about 39 years ago. She has never used smokeless tobacco. She reports that she does not drink alcohol or use illicit drugs.  family history includes Cancer in her brother, cousin, and father; Diabetes in her father, mother, and sister; Heart disease in her brother and father; Hypertension in her father and mother.  Allergies  Allergen Reactions  . Aspirin   . Codeine   . Penicillins Swelling    THROAT       PHYSICAL EXAMINATION: Vital signs: BP 110/70  Pulse 72  Ht 5\' 3"  (1.6 m)  Wt 205 lb (92.987 kg)  BMI 36.32 kg/m2  Constitutional: generally well-appearing, no acute distress Psychiatric: alert and oriented x3, cooperative Eyes: extraocular movements intact, anicteric, conjunctiva pink Mouth: oral pharynx moist, no lesions Neck: supple no lymphadenopathy Cardiovascular: heart regular rate and rhythm, no murmur Lungs: clear to auscultation bilaterally Abdomen: soft, nontender, nondistended, no obvious ascites, no peritoneal signs, normal bowel sounds, no organomegaly Extremities: no lower extremity edema bilaterally Skin: no lesions on visible extremities Neuro: No focal deficits. No asterixis.   ASSESSMENT:  #1. Recent right-sided pain resolved. Most likely musculoskeletal. #2. 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 No evidence of decompensated liver disease. Laboratory profile favorable. Being followed by medical specialties clinic #3. Colon cancer screening.  Up-to-date per patient report. Last exam 2008 #4. Multiple medical problems including insulin requiring diabetes  PLAN:  #1. Schedule surveillance upper endoscopy.The nature of the procedure, as well as the risks, benefits, and alternatives were carefully  and thoroughly reviewed with the patient. Ample time for discussion and questions allowed. The patient understood, was satisfied, and agreed to proceed. The patient is higher than baseline risk due to her comorbidities and diabetes. #2. Give one half p.m. insulin and hold a.m. Glucotrol for the examination #3. Continue general medical care with Dr. Dagmar Hait and liver care the of the [facility] medical specialties clinic personnel

## 2013-05-16 ENCOUNTER — Encounter: Payer: Self-pay | Admitting: Internal Medicine

## 2013-06-21 ENCOUNTER — Encounter: Payer: Medicare HMO | Admitting: Internal Medicine

## 2013-09-27 ENCOUNTER — Encounter: Payer: Self-pay | Admitting: Neurology

## 2013-10-03 ENCOUNTER — Ambulatory Visit (INDEPENDENT_AMBULATORY_CARE_PROVIDER_SITE_OTHER): Payer: Medicare HMO | Admitting: Neurology

## 2013-10-03 ENCOUNTER — Encounter: Payer: Self-pay | Admitting: Neurology

## 2013-10-03 VITALS — BP 122/74 | HR 67 | Temp 97.6°F | Ht 63.0 in | Wt 206.0 lb

## 2013-10-03 DIAGNOSIS — G4733 Obstructive sleep apnea (adult) (pediatric): Secondary | ICD-10-CM

## 2013-10-03 DIAGNOSIS — E663 Overweight: Secondary | ICD-10-CM

## 2013-10-03 DIAGNOSIS — R351 Nocturia: Secondary | ICD-10-CM

## 2013-10-03 NOTE — Progress Notes (Signed)
Subjective:    Patient ID: Jaime Boone is a 73 y.o. female.  HPI    Star Age, MD, PhD [facility] [address], [address] [place], [place] [address]  Dear Dr. Dagmar Hait,   I saw your patient, Jaime Boone, upon your kind request in my neurologic clinic today for initial consultation of her sleep disorder, in particular her prior diagnosis of obstructive sleep apnea. The patient is unaccompanied today. As you know, Jaime Boone is a 73 year old right-handed woman with an underlying medical history of hepatitis C, reflux disease, hypertension, asthma, arthritis, diabetes with associated retinopathy and nephropathy, questionable liver cirrhosis, connective tissue disease with 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 and degenerative joint disease, who was diagnosed with obstructive sleep apnea in 2006 and was given a CPAP machine but did not and that using it because she was apprehensive and afraid to use it. She presents for reevaluation and possible treatment. She complains of snoring and some HAs, not AM HAs. She would be willing to try CPAP. She sleeps on her sides and her stomach as she is afraid to sleep on her back for fear of breathing issues. She lives alone. She does not keep as sleep-wake schedule and goes to bed between 8:30 and MN and falls asleep fairly quickly but wakes up several times in the night. She has nocturia x 3 per night. She has no RLS symptoms. She was told at the time of the sleep test that she stopped breathing 19 times per hour. She wakes up between 6:30 and 7 AM. She is not a napper. She drinks one cup of coffee a day. She drinks alcohol occasionally. She quit smoking in '75. Her ESS is 5 today. She has no significant post-nasal drip or GERD at night. She snores and has had gasping for air. She does not sleep with anyone at this time.  She denies cataplexy, sleep paralysis, hypnagogic or hypnopompic hallucinations, or sleep attacks. She does not  report any vivid dreams, nightmares, dream enactments, or parasomnias, such as sleep talking or sleep walking. The patient has not had a sleep study or a home sleep test since the first time. She has no pets.   Her Past Medical History Is Significant For: Past Medical History  Diagnosis Date  . Diabetes mellitus without complication   . Asthma   . Arthritis   . Hypertension   . Acid reflux   . Hepatitis C   . Degenerative joint disease   . 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 Cirrhosis     Her Past Surgical History Is Significant For: Past Surgical History  Procedure Laterality Date  . Gallbladder surgery    . Foot surgery      BUNIONECTOMY  . Nose surgery    . Eye surgery      CATARACT  . Dilation and curettage of uterus    . Appendectomy    . Abdominal hysterectomy      TAH    Her Family History Is Significant For: Family History  Problem Relation Age of Onset  . Hypertension Mother   . Diabetes Mother   . Cancer Father     PROSTATE  . Hypertension Father   . Diabetes Father   . Heart disease Father   . Diabetes Sister   . Heart disease Brother   . Cancer Brother     PROSTATE  . Cancer Cousin     LUNG    Her Social  History Is Significant For: History   Social History  . Marital Status: Divorced    Spouse Name: N/A    Number of Children: 3  . Years of Education: N/A   Occupational History  . Retired    Social History Main Topics  . Smoking status: Former Smoker    Quit date: 03/01/1974  . Smokeless tobacco: Never Used  . Alcohol Use: No  . Drug Use: No  . Sexual Activity: No   Other Topics Concern  . None   Social History Narrative  . None    Her Allergies Are:  Allergies  Allergen Reactions  . Aspirin   . Codeine   . Penicillins Swelling    THROAT  :   Her Current Medications Are:  Outpatient Encounter Prescriptions as of 10/03/2013  Medication Sig  . BD PEN NEEDLE NANO U/F 32G X 4 MM MISC   .  Calcium Carbonate-Vitamin D (CALTRATE 600+D) 600-400 MG-UNIT per tablet Take 1 tablet by mouth daily.  . fluticasone (FLONASE) 50 MCG/ACT nasal spray   . glipiZIDE (GLUCOTROL) 10 MG tablet Take 10 mg by mouth 2 (two) times daily before a meal.  . insulin glargine (LANTUS) 100 UNIT/ML injection Inject into the skin at bedtime.  Marland Kitchen losartan (COZAAR) 100 MG tablet Take 100 mg by mouth daily.  . montelukast (SINGULAIR) 10 MG tablet Take 10 mg by mouth at bedtime.  Marland Kitchen omeprazole (PRILOSEC) 20 MG capsule Take 20 mg by mouth daily.  . ONE TOUCH ULTRA TEST test strip   . PROAIR HFA 108 (90 BASE) MCG/ACT inhaler   . triamcinolone cream (KENALOG) 0.1 % Apply 1 application topically as needed.  . vitamin E 600 UNIT capsule Take 600 Units by mouth daily.  Marland Kitchen levothyroxine (SYNTHROID, LEVOTHROID) 50 MCG tablet   . [DISCONTINUED] PRESCRIPTION MEDICATION DRUG FOR THYROID ? NAME  :  Review of Systems:  Out of a complete 14 point review of systems, all are reviewed and negative with the exception of these symptoms as listed below:  Review of Systems  Constitutional: Negative.   HENT: Positive for tinnitus.   Eyes: Positive for pain and visual disturbance (blurred vision).  Respiratory: Positive for shortness of breath.   Cardiovascular: Negative.   Gastrointestinal: Negative.   Endocrine: Negative.   Genitourinary: Negative.   Musculoskeletal: Negative.   Skin: Positive for rash.  Allergic/Immunologic: Negative.   Neurological: Positive for headaches.  Hematological:       Anemia  Psychiatric/Behavioral: Positive for sleep disturbance (snoring, restless leg).    Objective:  Neurologic Exam  Physical Exam Physical Examination:   Filed Vitals:   10/03/13 0856  BP: 122/74  Pulse: 67  Temp: 97.6 F (36.4 C)    General Examination: The patient is a very pleasant 73 y.o. female in no acute distress. She appears well-developed and well-nourished and well groomed. She is overweight.   HEENT:  Normocephalic, atraumatic, pupils are equal, round and reactive to light and accommodation, s/p L cataract repair. Funduscopic exam is normal with sharp disc margins noted. Extraocular tracking is good without limitation to gaze excursion or nystagmus noted. Normal smooth pursuit is noted. Hearing is grossly intact. Tympanic membranes are clear bilaterally. Face is symmetric with normal facial animation and normal facial sensation. Speech is clear with no dysarthria noted. There is no hypophonia. There is no lip, neck/head, jaw or voice tremor. Neck is supple with full range of passive and active motion. There are no carotid bruits on auscultation. Oropharynx exam reveals:  mild mouth dryness, adequate dental hygiene with multiple missing teeth on the bottom, and moderate airway crowding, due to tonsils in place and larger tongue. She has a small uvula.  Mallampati is class I. Tongue protrudes centrally and palate elevates symmetrically. Tonsils are 1+ in size. Neck size is 14.25 inches. She has a very small overbite. Nasal inspection reveals no significant nasal mucosal bogginess or redness and no septal deviation.   Chest: Clear to auscultation without wheezing, rhonchi or crackles noted.  Heart: S1+S2+0, regular and normal without murmurs, rubs or gallops noted.   Abdomen: Soft, non-tender and non-distended with normal bowel sounds appreciated on auscultation.  Extremities: There is no pitting edema in the distal lower extremities bilaterally. Pedal pulses are intact.  Skin: Warm and dry without trophic changes noted. There are no varicose veins.  Musculoskeletal: exam reveals no obvious joint deformities, tenderness or joint swelling or erythema.   Neurologically:  Mental status: The patient is awake, alert and oriented in all 4 spheres. Her immediate and remote memory, attention, language skills and fund of knowledge are appropriate. There is no evidence of aphasia, agnosia, apraxia or anomia.  Speech is clear with normal prosody and enunciation. Thought process is linear. Mood is normal and affect is normal.  Cranial nerves II - XII are as described above under HEENT exam. In addition: shoulder shrug is normal with equal shoulder height noted. Motor exam: Normal bulk, strength and tone is noted. There is no drift, tremor or rebound. Romberg is negative. Reflexes are 2+ throughout. Babinski: Toes are flexor bilaterally. Fine motor skills and coordination: intact with normal finger taps, normal hand movements, normal rapid alternating patting, normal foot taps and normal foot agility.  Cerebellar testing: No dysmetria or intention tremor on finger to nose testing. Heel to shin is unremarkable bilaterally. There is no truncal or gait ataxia.  Sensory exam: intact to light touch, pinprick, vibration, temperature sense in the upper and lower extremities.  Gait, station and balance: She stands with mild difficulty, d/t back stiffness reported. No veering to one side is noted. No leaning to one side is noted. Posture is age-appropriate and stance is narrow based. Gait shows normal stride length and normal pace. No problems turning are noted. She turns en bloc. Tandem walk is unremarkable. Intact toe and heel stance is noted.               Assessment and Plan:   In summary, Jaime Boone is a very pleasant 73 y.o.-year old female with an underlying medical history of hepatitis C, reflux disease, hypertension, asthma, arthritis, diabetes with associated retinopathy and nephropathy, questionable liver cirrhosis, connective tissue disease with 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 and degenerative joint disease, who was diagnosed with obstructive sleep apnea in 2006. Her history and physical exam concerning for obstructive sleep apnea (OSA). She is willing to embark on re-evaluation and potential.  I had a long chat with the patient about my findings and the diagnosis of OSA, its prognosis and treatment  options. We talked about medical treatments, surgical interventions and non-pharmacological approaches. I explained in particular the risks and ramifications of untreated moderate to severe OSA, especially with respect to developing cardiovascular disease down the Road, including congestive heart failure, difficult to treat hypertension, cardiac arrhythmias, or stroke. Even type 2 diabetes has, in part, been linked to untreated OSA. Symptoms of untreated OSA include daytime sleepiness, memory problems, mood irritability and mood disorder such as depression and anxiety, lack of energy, as well as recurrent headaches,  especially morning headaches. We talked about trying to maintain a healthy lifestyle in general, as well as the importance of weight control. I encouraged the patient to eat healthy, exercise daily and keep well hydrated, to keep a scheduled bedtime and wake time routine, to not skip any meals and eat healthy snacks in between meals. I advised the patient not to drive when feeling sleepy.  I recommended the following at this time: sleep study with potential positive airway pressure titration.  I explained the sleep test procedure to the patient and also outlined possible surgical and non-surgical treatment options of OSA, including the use of a custom-made dental device (which would require a referral to a specialist dentist or oral surgeon), upper airway surgical options, such as pillar implants, radiofrequency surgery, tongue base surgery, and UPPP (which would involve a referral to an ENT surgeon). Rarely, jaw surgery such as mandibular advancement may be considered.  I also explained the CPAP treatment option to the patient, who indicated that she would be willing to try CPAP if the need arises. I explained the importance of being compliant with PAP treatment, not only for insurance purposes but primarily to improve Her symptoms, and for the patient's long term health benefit, including to reduce  Her cardiovascular risks. I answered all her questions today and the patient was in agreement. I would like to see her back after the sleep study is completed and encouraged her to call with any interim questions, concerns, problems or updates.   Thank you very much for allowing me to participate in the care of this nice patient. If I can be of any further assistance to you please do not hesitate to call me at [phone].  Sincerely,   Star Age, MD, PhD

## 2013-10-10 ENCOUNTER — Other Ambulatory Visit: Payer: Self-pay

## 2013-10-10 DIAGNOSIS — Z1231 Encounter for screening mammogram for malignant neoplasm of breast: Secondary | ICD-10-CM

## 2013-11-13 ENCOUNTER — Ambulatory Visit
Admission: RE | Admit: 2013-11-13 | Discharge: 2013-11-13 | Disposition: A | Discharge status: Home / Self Care | Payer: Medicare HMO | Source: Ambulatory Visit

## 2013-11-13 DIAGNOSIS — Z1231 Encounter for screening mammogram for malignant neoplasm of breast: Secondary | ICD-10-CM

## 2014-01-05 ENCOUNTER — Other Ambulatory Visit (HOSPITAL_COMMUNITY): Payer: Self-pay | Admitting: Nurse Practitioner

## 2014-01-05 DIAGNOSIS — B182 Chronic viral hepatitis C: Secondary | ICD-10-CM

## 2014-01-23 ENCOUNTER — Ambulatory Visit (HOSPITAL_COMMUNITY)
Admission: RE | Admit: 2014-01-23 | Discharge: 2014-01-23 | Disposition: A | Discharge status: Home / Self Care | Payer: Medicare HMO | Source: Ambulatory Visit | Attending: Nurse Practitioner | Admitting: Nurse Practitioner

## 2014-01-23 DIAGNOSIS — B182 Chronic viral hepatitis C: Secondary | ICD-10-CM | POA: Diagnosis present

## 2014-01-29 ENCOUNTER — Encounter: Payer: Self-pay | Admitting: Neurology

## 2014-03-05 ENCOUNTER — Other Ambulatory Visit (HOSPITAL_COMMUNITY)
Admission: RE | Admit: 2014-03-05 | Discharge: 2014-03-05 | Disposition: A | Discharge status: Home / Self Care | Payer: Medicare Other | Source: Ambulatory Visit | Attending: Gynecology | Admitting: Gynecology

## 2014-03-05 ENCOUNTER — Ambulatory Visit (INDEPENDENT_AMBULATORY_CARE_PROVIDER_SITE_OTHER): Payer: Medicare Other | Admitting: Gynecology

## 2014-03-05 ENCOUNTER — Encounter: Payer: Self-pay | Admitting: Gynecology

## 2014-03-05 VITALS — BP 110/70 | Ht 62.5 in | Wt 212.0 lb

## 2014-03-05 DIAGNOSIS — N952 Postmenopausal atrophic vaginitis: Secondary | ICD-10-CM

## 2014-03-05 DIAGNOSIS — N951 Menopausal and female climacteric states: Secondary | ICD-10-CM

## 2014-03-05 DIAGNOSIS — Z8619 Personal history of other infectious and parasitic diseases: Secondary | ICD-10-CM | POA: Diagnosis not present

## 2014-03-05 DIAGNOSIS — Z1151 Encounter for screening for human papillomavirus (HPV): Secondary | ICD-10-CM | POA: Diagnosis present

## 2014-03-05 DIAGNOSIS — Z124 Encounter for screening for malignant neoplasm of cervix: Secondary | ICD-10-CM | POA: Insufficient documentation

## 2014-03-05 DIAGNOSIS — Z1272 Encounter for screening for malignant neoplasm of vagina: Secondary | ICD-10-CM | POA: Diagnosis not present

## 2014-03-05 NOTE — Patient Instructions (Signed)
Shingles Vaccine What You Need to Know WHAT IS SHINGLES?  Shingles is a painful skin rash, often with blisters. It is also called Herpes Zoster or just Zoster.  A shingles rash usually appears on one side of the face or body and lasts from 2 to 4 weeks. Its main symptom is pain, which can be quite severe. Other symptoms of shingles can include fever, headache, chills, and upset stomach. Very rarely, a shingles infection can lead to pneumonia, hearing problems, blindness, brain inflammation (encephalitis), or death.  For about 1 person in 5, severe pain can continue even after the rash clears up. This is called post-herpetic neuralgia.  Shingles is caused by the Varicella Zoster virus. This is the same virus that causes chickenpox. Only someone who has had a case of chickenpox or rarely, has gotten chickenpox vaccine, can get shingles. The virus stays in your body. It can reappear many years later to cause a case of shingles.  You cannot catch shingles from another person with shingles. However, a person who has never had chickenpox (or chickenpox vaccine) could get chickenpox from someone with shingles. This is not very common.  Shingles is far more common in people 50 and older than in younger people. It is also more common in people whose immune systems are weakened because of a disease such as cancer or drugs such as steroids or chemotherapy.  At least 1 million people get shingles per year in the [place]. SHINGLES VACCINE  A vaccine for shingles was licensed in 2006. In clinical trials, the vaccine reduced the risk of shingles by 50%. It can also reduce the pain in people who still get shingles after being vaccinated.  A single dose of shingles vaccine is recommended for adults 60 years of age and older. SOME PEOPLE SHOULD NOT GET SHINGLES VACCINE OR SHOULD WAIT A person should not get shingles vaccine if he or she:  Has ever had a life-threatening allergic reaction to gelatin, the  antibiotic neomycin, or any other component of shingles vaccine. Tell your caregiver if you have any severe allergies.  Has a weakened immune system because of current:  AIDS or another disease that affects the immune system.  Treatment with drugs that affect the immune system, such as prolonged use of high-dose steroids.  Cancer treatment, such as radiation or chemotherapy.  Cancer affecting the bone marrow or lymphatic system, such as leukemia or lymphoma.  Is pregnant, or might be pregnant. Women should not become pregnant until at least 4 weeks after getting shingles vaccine. Someone with a minor illness, such as a cold, may be vaccinated. Anyone with a moderate or severe acute illness should usually wait until he or she recovers before getting the vaccine. This includes anyone with a temperature of 101.3 F (38 C) or higher. WHAT ARE THE RISKS FROM SHINGLES VACCINE?  A vaccine, like any medicine, could possibly cause serious problems, such as severe allergic reactions. However, the risk of a vaccine causing serious harm, or death, is extremely small.  No serious problems have been identified with shingles vaccine. Mild Problems  Redness, soreness, swelling, or itching at the site of the injection (about 1 person in 3).  Headache (about 1 person in 70). Like all vaccines, shingles vaccine is being closely monitored for unusual or severe problems. WHAT IF THERE IS A MODERATE OR SEVERE REACTION? What should I look for? Any unusual condition, such as a severe allergic reaction or a high fever. If a severe allergic reaction   occurred, it would be within a few minutes to an hour after the shot. Signs of a serious allergic reaction can include difficulty breathing, weakness, hoarseness or wheezing, a fast heartbeat, hives, dizziness, paleness, or swelling of the throat. What should I do?  Call your caregiver, or get the person to a caregiver right away.  Tell the caregiver what  happened, the date and time it happened, and when the vaccination was given.  Ask the caregiver to report the reaction by filing a Vaccine Adverse Event Reporting System (VAERS) form. Or, you can file this report through the VAERS web site at www.vaers.hhs.gov or by calling 1-800-822-7967. VAERS does not provide medical advice. HOW CAN I LEARN MORE?  Ask your caregiver. He or she can give you the vaccine package insert or suggest other sources of information.  Contact the Centers for Disease Control and Prevention (CDC):  Call 1-800-232-4636 (1-800-CDC-INFO).  Visit the CDC website at www.cdc.gov/vaccines CDC Shingles Vaccine VIS (01/03/08) Document Released: 01/11/2006 Document Revised: 06/08/2011 Document Reviewed: 07/06/2012 ExitCare Patient Information 2015 [employer]. This information is not intended to replace advice given to you by your health care provider. Make sure you discuss any questions you have with your health care provider.  
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
## 2014-03-05 NOTE — Addendum Note (Signed)
Addended by: Thurnell Garbe A on: 03/05/2014 11:28 AM   Modules accepted: Orders

## 2014-03-07 LAB — CYTOLOGY - PAP

## 2014-03-19 ENCOUNTER — Other Ambulatory Visit: Payer: Self-pay | Admitting: Internal Medicine

## 2014-03-19 DIAGNOSIS — R1310 Dysphagia, unspecified: Secondary | ICD-10-CM

## 2014-03-21 ENCOUNTER — Ambulatory Visit
Admission: RE | Admit: 2014-03-21 | Discharge: 2014-03-21 | Disposition: A | Discharge status: Home / Self Care | Payer: Medicare Other | Source: Ambulatory Visit | Attending: Internal Medicine | Admitting: Internal Medicine

## 2014-03-21 DIAGNOSIS — R1310 Dysphagia, unspecified: Secondary | ICD-10-CM

## 2014-04-26 ENCOUNTER — Other Ambulatory Visit: Payer: Self-pay | Admitting: Gynecology

## 2014-04-26 ENCOUNTER — Ambulatory Visit (INDEPENDENT_AMBULATORY_CARE_PROVIDER_SITE_OTHER): Payer: Medicare Other

## 2014-04-26 DIAGNOSIS — Z13820 Encounter for screening for osteoporosis: Secondary | ICD-10-CM

## 2014-04-26 DIAGNOSIS — N951 Menopausal and female climacteric states: Secondary | ICD-10-CM

## 2014-05-03 ENCOUNTER — Other Ambulatory Visit: Payer: Self-pay | Admitting: Gynecology

## 2014-05-03 DIAGNOSIS — N951 Menopausal and female climacteric states: Secondary | ICD-10-CM

## 2014-05-03 DIAGNOSIS — Z13820 Encounter for screening for osteoporosis: Secondary | ICD-10-CM

## 2014-05-10 DIAGNOSIS — E11319 Type 2 diabetes mellitus with unspecified diabetic retinopathy without macular edema: Secondary | ICD-10-CM | POA: Insufficient documentation

## 2014-08-23 DIAGNOSIS — F39 Unspecified mood [affective] disorder: Secondary | ICD-10-CM | POA: Insufficient documentation

## 2014-10-08 ENCOUNTER — Other Ambulatory Visit: Payer: Self-pay

## 2014-10-08 DIAGNOSIS — Z1231 Encounter for screening mammogram for malignant neoplasm of breast: Secondary | ICD-10-CM

## 2014-10-09 ENCOUNTER — Ambulatory Visit: Payer: Medicaid Other | Attending: Orthopedic Surgery | Admitting: Physical Therapy

## 2014-10-09 DIAGNOSIS — M2560 Stiffness of unspecified joint, not elsewhere classified: Secondary | ICD-10-CM | POA: Insufficient documentation

## 2014-10-09 DIAGNOSIS — M5386 Other specified dorsopathies, lumbar region: Secondary | ICD-10-CM

## 2014-10-09 DIAGNOSIS — R29898 Other symptoms and signs involving the musculoskeletal system: Secondary | ICD-10-CM | POA: Insufficient documentation

## 2014-10-09 DIAGNOSIS — M545 Low back pain, unspecified: Secondary | ICD-10-CM

## 2014-10-09 DIAGNOSIS — M25551 Pain in right hip: Secondary | ICD-10-CM

## 2014-10-09 DIAGNOSIS — M62830 Muscle spasm of back: Secondary | ICD-10-CM | POA: Diagnosis present

## 2014-10-09 NOTE — Patient Instructions (Signed)
   Chyanne Kohut PT, DPT, LAT, ATC  [facility] Phone: [phone]     

## 2014-10-09 NOTE — Therapy (Signed)
[place], [place], [address] Phone: [phone]   Fax:  [phone]  Physical Therapy Evaluation  Patient Details  Name: Jaime Boone MRN: [id number] Date of Birth: 10/18/1940 Referring Provider:  Rod Can, MD  Encounter Date: 10/09/2014      PT End of Session - 10/09/14 0935    Visit Number 1   Number of Visits 12   Date for PT Re-Evaluation 11/20/14   Authorization Type Medicare / medicaid secondary   PT Start Time 0835   PT Stop Time 0928   PT Time Calculation (min) 53 min   Activity Tolerance Patient tolerated treatment well   Behavior During Therapy [facility] for tasks assessed/performed      Past Medical History  Diagnosis Date  . Diabetes mellitus without complication   . Asthma   . Arthritis   . Hypertension   . Acid reflux   . Hepatitis C   . Degenerative joint disease   . 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 Cirrhosis     Past Surgical History  Procedure Laterality Date  . Gallbladder surgery    . Foot surgery      BUNIONECTOMY  . Nose surgery    . Eye surgery      CATARACT  . Dilation and curettage of uterus    . Appendectomy    . Abdominal hysterectomy      TAH    There were no vitals filed for this visit.  Visit Diagnosis:  Midline low back pain without sciatica - Plan: PT plan of care cert/re-cert  Decreased ROM of lumbar spine - Plan: PT plan of care cert/re-cert  Muscle spasm of back - Plan: PT plan of care cert/re-cert  Right hip pain - Plan: PT plan of care cert/re-cert  Weakness of both legs - Plan: PT plan of care cert/re-cert      Subjective Assessment - 10/09/14 0849    Subjective pt is a 74 y.o F with CC of low back pain and R hip bursitis. The hip started from a fall in 2013 after landing on the R hip, and fell again in April on the R hip. the low back pain has going on for a long time. She reports symptoms have been  staying consistent since onset.    Limitations Sitting;Lifting;Standing;Walking;House hold activities   How long can you sit comfortably? 1 hour but some discomfort   How long can you stand comfortably? 10-15   How long can you walk comfortably? 10-15   Diagnostic tests X-ray 09/22/2014 low back degenerative, hip in April per pt report and no bony abnormalties    Patient Stated Goals to be pain free, to be able to walk and exercise and do daily activities   Currently in Pain? Yes   Pain Score 3    Pain Location Back   Pain Orientation Mid;Lower   Pain Descriptors / Indicators Sharp;Stabbing   Pain Type Chronic pain   Pain Onset More than a month ago   Pain Frequency Constant   Aggravating Factors  bending forward, prolong standing and walking   Pain Relieving Factors luke warm water   Multiple Pain Sites Yes   Pain Score 4   Pain Location Hip   Pain Orientation Right   Pain Descriptors / Indicators Aching;Sore   Pain Type Chronic pain   Pain Onset More than a month ago   Pain Frequency Constant   Aggravating Factors  prolong standing, walking   Pain Relieving Factors luke warm water            [facility] PT Assessment - 10/09/14 0900    Assessment   Medical Diagnosis low back pain, and R hip bursitis   Onset Date/Surgical Date --  low back a couple years, R hip last couple months   Hand Dominance Right   Next MD Visit PRN   Prior Therapy yes  low back   Precautions   Precautions None   Restrictions   Weight Bearing Restrictions No   Balance Screen   Has the patient fallen in the past 6 months Yes   How many times? 1   Has the patient had a decrease in activity level because of a fear of falling?  Yes   Is the patient reluctant to leave their home because of a fear of falling?  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 assessed   Observation/Other Assessments   Focus on Therapeutic Outcomes (FOTO)  55% limitation   predicted 43% limitation   Posture/Postural Control   Posture/Postural Control Postural limitations   Postural Limitations Rounded Shoulders;Forward head;Increased lumbar lordosis   ROM / Strength   AROM / PROM / Strength AROM;Strength   AROM   AROM Assessment Site Lumbar;Hip   Right/Left Hip Right;Left   Lumbar Flexion 35   Lumbar Extension 15   Lumbar - Right Side Bend 28   Lumbar - Left Side Bend 30   Lumbar - Right Rotation 15%   Lumbar - Left Rotation 25%   Strength   Strength Assessment Site Hip;Knee   Right/Left Hip Right;Left   Right Hip Flexion 3+/5   Right Hip Extension 4-/5   Right Hip ABduction 3+/5   Right Hip ADduction 3+/5   Left Hip Flexion 3+/5   Left Hip Extension 4-/5   Left Hip ABduction 3+/5   Left Hip ADduction 3+/5   Right/Left Knee Right;Left   Right Knee Flexion 4-/5   Right Knee Extension 4-/5   Left Knee Flexion 4-/5   Left Knee Extension 4-/5   Palpation   Spinal mobility hypomobility with P/A L1-L5 with pain upon palpation    Palpation comment tendneress at the greater trochanter, L glutes and bil lumbar paraspinals   Special Tests    Special Tests Lumbar;Hip Special Tests   Lumbar Tests Prone Knee Bend Test;Straight Leg Raise;FABER test   Hip Special Tests  Ober's Test;Hip Scouring   FABER test   findings Negative   Side --  bil   Prone Knee Bend Test   Findings Negative   Side --  bil   Straight  Leg Raise   Findings Negative   Side  --  bil   Hip Scouring   Findings Negative   Side --  bil                   [facility] Adult PT Treatment/Exercise - 10/09/14 0900  Lumbar Exercises: Stretches   Active Hamstring Stretch 2 reps;30 seconds   Single Knee to Chest Stretch 2 reps;30 seconds   Lower Trunk Rotation 5 reps;10 seconds   Pelvic Tilt 5 reps;10 seconds   ITB Stretch 1 rep;30 seconds  standing with HHA from chair                PT Education - 10/09/14 0934    Education provided Yes   Education Details evaluation findings, POC, HEP, goals, anatomical education   Person(s) Educated Patient   Methods Explanation   Comprehension Verbalized understanding          PT Short Term Goals - 10/09/14 0947    PT SHORT TERM GOAL #1   Title pt will be I with basic HEP (10/30/2014)   Time 3   Period Weeks   Status New   PT SHORT TERM GOAL #2   Title pt will increase her FOTO score by > 5 points to demonstrate increased functional capacity (10/30/2014)   Time 3   Period Weeks   Status New   PT SHORT TERM GOAL #3   Title she will be able to verbalize and demonstrate techniques to reduce hip/low back reinjury via postural awareness, lifting and carrying mechanics and HEP (10/30/2014)   Time 3   Period Weeks   Status New           PT Long Term Goals - 10/09/14 4403    PT LONG TERM GOAL #1   Title upon discharge pt will be I with all HEP given throughout therapy (11/20/2014)   Time 6   Period Weeks   Status New   PT LONG TERM GOAL #2   Title pt will increase bil LE strenght to >4/5 to assist walking endurance and safety (11/20/2014)   Time 6   Period Weeks   Status New   PT LONG TERM GOAL #3   Title she will demontrate < 2/10 pain during and following prolonged walking/standing for > 30 min, and ascending/descend > 12 steps to help with community amb (11/20/2014)   Time 6   Period Weeks   Status New   PT LONG TERM GOAL #4   Title She will increase  trunk mobility in all planes by > 10 degrees to help with ADLs (11/20/2014)   Time 6   Period Weeks   Status New   PT LONG TERM GOAL #5   Title  pt will demonstate FOTO score of 57 to demonstrate improved functional capacity upon discharge (11/20/2014)    Time 6   Period Weeks   Status New               Plan - 10/09/14 0935    Clinical Impression Statement Jaime Boone presents to OPPT with CC of low back pain thats been going on for a while and R hip pain thats been going on for a couple of months secondary to 2 falls onto her R hip. She demonstrates limited strength in bil LE rated a 3+/5 which pt reports history of knees giving way. She demonstrates limited trunk mobility with increased pain at end range in all planes with increased soreness during R sided rotation and sidebending. she demontrates tendneress at the greater trochanter, L glutes and bil lumbar paraspinals, with hypomobility of L1-L5. She would benefit from skilled physical therapy to maximize her function by addressing the impairments listed below.    Pt will benefit from skilled therapeutic intervention in order to improve on  the following deficits Decreased activity tolerance;Decreased endurance;Decreased balance;Hypomobility;Decreased strength;Decreased range of motion;Impaired flexibility;Pain;Improper body mechanics;Postural dysfunction;Decreased mobility   Rehab Potential Good   PT Frequency 2x / week   PT Duration 6 weeks   PT Treatment/Interventions ADLs/Self Care Home Management;Cryotherapy;Electrical Stimulation;Iontophoresis 4mg /ml Dexamethasone;Moist Heat;Ultrasound;Therapeutic activities;Therapeutic exercise;Balance training;Neuromuscular re-education;Patient/family education;Manual techniques;Dry needling;Taping   PT Next Visit Plan assess response to HEP, STM for hip, and bil LE strengthening,spine mobilization, assess hip AROM, and berg balance.    PT Home Exercise Plan see HEP handout   Consulted and Agree with  Plan of Care Patient          G-Codes - October 14, 2014 0929    Functional Assessment Tool Used FOTO    Functional Limitation Changing and maintaining body position   Changing and Maintaining Body Position Current Status [phone]) At least 40 percent but less than 60 percent impaired, limited or restricted   Changing and Maintaining Body Position Goal Status (V2023) At least 20 percent but less than 40 percent impaired, limited or restricted       Problem List Patient Active Problem List   Diagnosis Date Noted  . Sebaceous cyst of labia 04/06/2012  . History of hepatitis C 04/06/2012  . Constipation 04/06/2012  . Postmenopausal 03/01/2012  . Vaginal atrophy 03/01/2012   Starr Lake PT, DPT, LAT, ATC  10-14-14  9:58 AM    [facility] [address] [place], [place], [address] Phone: [phone]   Fax:  [phone]

## 2014-10-15 ENCOUNTER — Ambulatory Visit: Payer: Medicaid Other | Admitting: Physical Therapy

## 2014-10-15 DIAGNOSIS — M545 Low back pain, unspecified: Secondary | ICD-10-CM

## 2014-10-15 DIAGNOSIS — M5386 Other specified dorsopathies, lumbar region: Secondary | ICD-10-CM

## 2014-10-15 DIAGNOSIS — M62830 Muscle spasm of back: Secondary | ICD-10-CM

## 2014-10-15 DIAGNOSIS — R29898 Other symptoms and signs involving the musculoskeletal system: Secondary | ICD-10-CM

## 2014-10-15 DIAGNOSIS — M25551 Pain in right hip: Secondary | ICD-10-CM

## 2014-10-15 NOTE — Therapy (Signed)
Jaime Boone, [place], [address] Phone: [phone]   Fax:  [phone]  Physical Therapy Treatment  Patient Details  Name: Jaime Boone MRN: [id number] Date of Birth: May 10, 1940 Referring Provider:  Prince Solian, MD  Encounter Date: 10/15/2014      PT End of Session - 10/15/14 1216    Visit Number 2   Number of Visits 12   Date for PT Re-Evaluation 11/20/14   PT Start Time 1130   PT Stop Time 1217   PT Time Calculation (min) 47 min   Activity Tolerance Patient tolerated treatment well   Behavior During Therapy [facility] for tasks assessed/performed      Past Medical History  Diagnosis Date  . Diabetes mellitus without complication   . Asthma   . Arthritis   . Hypertension   . Acid reflux   . Hepatitis C   . Degenerative joint disease   . 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 Cirrhosis     Past Surgical History  Procedure Laterality Date  . Gallbladder surgery    . Foot surgery      BUNIONECTOMY  . Nose surgery    . Eye surgery      CATARACT  . Dilation and curettage of uterus    . Appendectomy    . Abdominal hysterectomy      TAH    There were no vitals filed for this visit.  Visit Diagnosis:  Midline low back pain without sciatica  Decreased ROM of lumbar spine  Muscle spasm of back  Right hip pain  Weakness of both legs      Subjective Assessment - 10/15/14 1137    Subjective "I am feeling off today, my blood sugar was up today. pt reports that she is being consisted with her HEP but its tough to tell it is slowly getting better.    Currently in Pain? Yes   Pain Score 0-No pain   Pain Location Back   Pain Orientation Mid;Lower   Pain Score 0   Pain Location Hip   Pain Orientation Right                         [facility] Adult PT Treatment/Exercise - 10/15/14 1145    Lumbar Exercises: Stretches   Active Hamstring Stretch 2  reps;30 seconds   Single Knee to Chest Stretch 2 reps;30 seconds   Lower Trunk Rotation 5 reps;10 seconds   Pelvic Tilt 5 reps;10 seconds   ITB Stretch 1 rep;30 seconds   Lumbar Exercises: Aerobic   Stationary Bike Nu Step L4 x 6 min  using LE only   Lumbar Exercises: Supine   Bent Knee Raise 15 reps  VC for abdominal contraction   Bridge 15 reps;1 second  x 2 sets   Lumbar Exercises: Prone   Other Prone Lumbar Exercises multifidi contracton/activation with hamstring curls  3 x 10 reps   Knee/Hip Exercises: Supine   Other Supine Knee/Hip Exercises clam shells 2 x 15   green theraband   Manual Therapy   Manual Therapy Myofascial release   Myofascial Release bil thoracolumbar paraspinal DTM and trigger point release   using tennis ball                PT Education - 10/15/14 1216    Education provided Yes   Education Details HEP review   Person(s) Educated Patient   Methods Explanation  Comprehension Verbalized understanding          PT Short Term Goals - 10/09/14 0947    PT SHORT TERM GOAL #1   Title pt will be I with basic HEP (10/30/2014)   Time 3   Period Weeks   Status New   PT SHORT TERM GOAL #2   Title pt will increase her FOTO score by > 5 points to demonstrate increased functional capacity (10/30/2014)   Time 3   Period Weeks   Status New   PT SHORT TERM GOAL #3   Title she will be able to verbalize and demonstrate techniques to reduce hip/low back reinjury via postural awareness, lifting and carrying mechanics and HEP (10/30/2014)   Time 3   Period Weeks   Status New           PT Long Term Goals - 10/09/14 6222    PT LONG TERM GOAL #1   Title upon discharge pt will be I with all HEP given throughout therapy (11/20/2014)   Time 6   Period Weeks   Status New   PT LONG TERM GOAL #2   Title pt will increase bil LE strenght to >4/5 to assist walking endurance and safety (11/20/2014)   Time 6   Period Weeks   Status New   PT LONG TERM GOAL #3    Title she will demontrate < 2/10 pain during and following prolonged walking/standing for > 30 min, and ascending/descend > 12 steps to help with community amb (11/20/2014)   Time 6   Period Weeks   Status New   PT LONG TERM GOAL #4   Title She will increase trunk mobility in all planes by > 10 degrees to help with ADLs (11/20/2014)   Time 6   Period Weeks   Status New   PT LONG TERM GOAL #5   Title  pt will demonstate FOTO score of 57 to demonstrate improved functional capacity upon discharge (11/20/2014)    Time 6   Period Weeks   Status New               Plan - 10/15/14 1217    Clinical Impression Statement Jaime Boone reports that she has been compliant with her HEP doing it every day. She reports that she feels tired today and that her sugar was elevated in the AM. Instructed pt to let therapist know if anything changes in how she felt occured throughout therapy. She tolerated treatment well with no report of pain of symptoms. She required cueing on how to activite and maintain multifidi during hamstring curls.    PT Next Visit Plan STM/triggerpoint release of paraspinals, and bil LE strengthening, assess hip AROM, CKC exercises and core strengtheing   Consulted and Agree with Plan of Care Patient        Problem List Patient Active Problem List   Diagnosis Date Noted  . Sebaceous cyst of labia 04/06/2012  . History of hepatitis C 04/06/2012  . Constipation 04/06/2012  . Postmenopausal 03/01/2012  . Vaginal atrophy 03/01/2012   Jaime Boone PT, DPT, LAT, ATC  10/15/2014  12:22 PM     [facility] [address] [place], [place], [address] Phone: [phone]   Fax:  [phone]

## 2014-10-25 ENCOUNTER — Ambulatory Visit: Payer: Medicaid Other | Admitting: Physical Therapy

## 2014-10-25 DIAGNOSIS — M545 Low back pain, unspecified: Secondary | ICD-10-CM

## 2014-10-25 DIAGNOSIS — M5386 Other specified dorsopathies, lumbar region: Secondary | ICD-10-CM

## 2014-10-25 DIAGNOSIS — R29898 Other symptoms and signs involving the musculoskeletal system: Secondary | ICD-10-CM

## 2014-10-25 DIAGNOSIS — M25551 Pain in right hip: Secondary | ICD-10-CM

## 2014-10-25 DIAGNOSIS — M62830 Muscle spasm of back: Secondary | ICD-10-CM

## 2014-10-25 NOTE — Patient Instructions (Signed)
Aquatic shoes must be worn in pool with pool walking, exercise.

## 2014-10-25 NOTE — Therapy (Signed)
[place], [place], [address] Phone: [phone]   Fax:  [phone]  Physical Therapy Treatment  Patient Details  Name: Jaime Boone MRN: [id number] Date of Birth: April 20, 1940 Referring Provider:  Prince Solian, MD  Encounter Date: 10/25/2014      PT End of Session - 10/25/14 0859    Visit Number 3   Number of Visits 12   Date for PT Re-Evaluation 11/20/14   PT Start Time 0801   PT Stop Time 0906   PT Time Calculation (min) 65 min   Activity Tolerance Patient tolerated treatment well;Patient limited by pain   Behavior During Therapy [facility] for tasks assessed/performed      Past Medical History  Diagnosis Date  . Diabetes mellitus without complication   . Asthma   . Arthritis   . Hypertension   . Acid reflux   . Hepatitis C   . Degenerative joint disease   . 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 Cirrhosis     Past Surgical History  Procedure Laterality Date  . Gallbladder surgery    . Foot surgery      BUNIONECTOMY  . Nose surgery    . Eye surgery      CATARACT  . Dilation and curettage of uterus    . Appendectomy    . Abdominal hysterectomy      TAH    There were no vitals filed for this visit.  Visit Diagnosis:  Midline low back pain without sciatica  Decreased ROM of lumbar spine  Muscle spasm of back  Right hip pain  Weakness of both legs      Subjective Assessment - 10/25/14 0757    Subjective Hard to get in and out of her tub.   Currently in Pain? Yes  moderate   Pain Score 5    Aggravating Factors  bending forward   Pain Relieving Factors bath   Multiple Pain Sites Yes                         [facility] Adult PT Treatment/Exercise - 10/25/14 0815    Lumbar Exercises: Standing   Heel Raises 10 reps  holds counter for safety, both   Functional Squats Limitations Hip hinge, back to counter, multiple cues,pain into Leg  Rt not sure if from back or knee   Knee/Hip Exercises: Standing   Forward Step Up Both;1 set;Hand Hold: 2;Step Height: 6"  5 reps, painful, Lt hip collapses   Moist Heat Therapy   Number Minutes Moist Heat 15 Minutes   Moist Heat Location Lumbar Spine   Manual Therapy   Manual therapy comments prone, neuromusclat trigger point release.  mid to low back.  Rt sensitive>LT , able to soften tissue                  PT Short Term Goals - 10/09/14 0947    PT SHORT TERM GOAL #1   Title pt will be I with basic HEP (10/30/2014)   Time 3   Period Weeks   Status New   PT SHORT TERM GOAL #2   Title pt will increase her FOTO score by > 5 points to demonstrate increased functional capacity (10/30/2014)   Time 3   Period Weeks   Status New   PT SHORT TERM GOAL #3   Title she will be able to verbalize and demonstrate techniques to reduce hip/low  back reinjury via postural awareness, lifting and carrying mechanics and HEP (10/30/2014)   Time 3   Period Weeks   Status New           PT Long Term Goals - 10/09/14 4599    PT LONG TERM GOAL #1   Title upon discharge pt will be I with all HEP given throughout therapy (11/20/2014)   Time 6   Period Weeks   Status New   PT LONG TERM GOAL #2   Title pt will increase bil LE strenght to >4/5 to assist walking endurance and safety (11/20/2014)   Time 6   Period Weeks   Status New   PT LONG TERM GOAL #3   Title she will demontrate < 2/10 pain during and following prolonged walking/standing for > 30 min, and ascending/descend > 12 steps to help with community amb (11/20/2014)   Time 6   Period Weeks   Status New   PT LONG TERM GOAL #4   Title She will increase trunk mobility in all planes by > 10 degrees to help with ADLs (11/20/2014)   Time 6   Period Weeks   Status New   PT LONG TERM GOAL #5   Title  pt will demonstate FOTO score of 57 to demonstrate improved functional capacity upon discharge (11/20/2014)    Time 6   Period Weeks    Status New               Plan - 10/25/14 0900    Clinical Impression Statement Moving painful for patient.  Closed chain exercises painful.  No new goals met.     PT Next Visit Plan STM/triggerpoint release of paraspinals, and bil LE strengthening, assess hip AROM, CKC exercises and core strengtheing   Consulted and Agree with Plan of Care Patient        Problem List Patient Active Problem List   Diagnosis Date Noted  . Sebaceous cyst of labia 04/06/2012  . History of hepatitis C 04/06/2012  . Constipation 04/06/2012  . Postmenopausal 03/01/2012  . Vaginal atrophy 03/01/2012    [facility] 10/25/2014, 9:10 AM  [facility] [address] [place], [place], [address] Phone: [phone]   Fax:  [phone]     Melvenia Needles, PTA 10/25/2014 9:10 AM Phone: [phone] Fax: [phone]

## 2014-10-29 ENCOUNTER — Encounter: Payer: Medicaid Other | Admitting: Physical Therapy

## 2014-10-31 ENCOUNTER — Encounter: Payer: Medicaid Other | Admitting: Physical Therapy

## 2014-11-05 ENCOUNTER — Ambulatory Visit: Payer: Medicaid Other | Attending: Orthopedic Surgery | Admitting: Physical Therapy

## 2014-11-05 DIAGNOSIS — R29898 Other symptoms and signs involving the musculoskeletal system: Secondary | ICD-10-CM

## 2014-11-05 DIAGNOSIS — M62830 Muscle spasm of back: Secondary | ICD-10-CM | POA: Insufficient documentation

## 2014-11-05 DIAGNOSIS — M2560 Stiffness of unspecified joint, not elsewhere classified: Secondary | ICD-10-CM | POA: Insufficient documentation

## 2014-11-05 DIAGNOSIS — M25551 Pain in right hip: Secondary | ICD-10-CM | POA: Diagnosis present

## 2014-11-05 DIAGNOSIS — M545 Low back pain: Secondary | ICD-10-CM | POA: Insufficient documentation

## 2014-11-05 NOTE — Therapy (Signed)
[place], [place], [address] Phone: [phone]   Fax:  [phone]  Physical Therapy Treatment  Patient Details  Name: Jaime Boone MRN: [id number] Date of Birth: [birth year] Referring Provider:  Prince Solian, MD  Encounter Date: 11/05/2014      PT End of Session - 11/05/14 1324    Visit Number 4   Number of Visits 12   Date for PT Re-Evaluation 11/20/14   PT Start Time 1157   PT Stop Time 1240   PT Time Calculation (min) 43 min   Activity Tolerance Patient tolerated treatment well   Behavior During Therapy [facility] for tasks assessed/performed      Past Medical History  Diagnosis Date  . Diabetes mellitus without complication   . Asthma   . Arthritis   . Hypertension   . Acid reflux   . Hepatitis C   . Degenerative joint disease   . 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 Cirrhosis     Past Surgical History  Procedure Laterality Date  . Gallbladder surgery    . Foot surgery      BUNIONECTOMY  . Nose surgery    . Eye surgery      CATARACT  . Dilation and curettage of uterus    . Appendectomy    . Abdominal hysterectomy      TAH    There were no vitals filed for this visit.  Visit Diagnosis:  Weakness of both legs      Subjective Assessment - 11/05/14 1313    Subjective No pain   How long can you walk comfortably? 10-15   Currently in Pain? No/denies   Aggravating Factors  walking carrying portable chair   Pain Relieving Factors sitting                         [facility] Adult PT Treatment/Exercise - 11/05/14 1319    Self-Care   Self-Care --   walking shoes for support, diet, eating 1 hour before exerc   Lumbar Exercises: Supine   Ab Set 10 reps  5 second, cued   Clam 10 reps  0 resistance the , green band, added to home   Bent Knee Raise --  3 reps stopped due to hamstring cramp LT   Bridge 10 reps   Other Supine Lumbar  Exercises abdominal set with ball squeeze   Knee/Hip Exercises: Standing   Heel Raises Both  10X   Forward Step Up Both;1 set;10 reps;Hand Hold: 2;Step Height: 6"   Wall Squat --  tried 2 reps stopped due to pain   Other Standing Knee Exercises single leg stand, slide opposite leg 3 directions CGA 10 reps each   Occasional need of had for balance.                  PT Short Term Goals - 10/09/14 0947    PT SHORT TERM GOAL #1   Title pt will be I with basic HEP (10/30/2014)   Time 3   Period Weeks   Status New   PT SHORT TERM GOAL #2   Title pt will increase her FOTO score by > 5 points to demonstrate increased functional capacity (10/30/2014)   Time 3   Period Weeks   Status New   PT SHORT TERM GOAL #3   Title she will be able to verbalize and demonstrate techniques to reduce hip/low back  reinjury via postural awareness, lifting and carrying mechanics and HEP (10/30/2014)   Time 3   Period Weeks   Status New           PT Long Term Goals - 10/09/14 0948    PT LONG TERM GOAL #1   Title upon discharge pt will be I with all HEP given throughout therapy (11/20/2014)   Time 6   Period Weeks   Status New   PT LONG TERM GOAL #2   Title pt will increase bil LE strenght to >4/5 to assist walking endurance and safety (11/20/2014)   Time 6   Period Weeks   Status New   PT LONG TERM GOAL #3   Title she will demontrate < 2/10 pain during and following prolonged walking/standing for > 30 min, and ascending/descend > 12 steps to help with community amb (11/20/2014)   Time 6   Period Weeks   Status New   PT LONG TERM GOAL #4   Title She will increase trunk mobility in all planes by > 10 degrees to help with ADLs (11/20/2014)   Time 6   Period Weeks   Status New   PT LONG TERM GOAL #5   Title  pt will demonstate FOTO score of 57 to demonstrate improved functional capacity upon discharge (11/20/2014)    Time 6   Period Weeks   Status New               Plan - 11/05/14  1325    Clinical Impression Statement Walking painful yesterday.  No pain today.  She is considering bringing supportive shoes to PT,  Getting new diabetic shoes.  Progress toward home exercise goals.  Pain intermittant,   PT Next Visit Plan review supine clams with band. (Sidelying painful with hip)advance exercise as able        Problem List Patient Active Problem List   Diagnosis Date Noted  . Sebaceous cyst of labia 04/06/2012  . History of hepatitis C 04/06/2012  . Constipation 04/06/2012  . Postmenopausal 03/01/2012  . Vaginal atrophy 03/01/2012    Roza Creamer 11/05/2014, 1:31 PM  [facility] [address] [place], [place], [address] Phone: [phone]   Fax:  [phone]     Melvenia Needles, PTA 11/05/2014 1:31 PM Phone: [phone] Fax: [phone]

## 2014-11-07 ENCOUNTER — Ambulatory Visit: Payer: Medicaid Other | Admitting: Physical Therapy

## 2014-11-07 DIAGNOSIS — R29898 Other symptoms and signs involving the musculoskeletal system: Secondary | ICD-10-CM

## 2014-11-07 DIAGNOSIS — M62830 Muscle spasm of back: Secondary | ICD-10-CM

## 2014-11-07 DIAGNOSIS — M545 Low back pain, unspecified: Secondary | ICD-10-CM

## 2014-11-07 DIAGNOSIS — M25551 Pain in right hip: Secondary | ICD-10-CM

## 2014-11-07 DIAGNOSIS — M5386 Other specified dorsopathies, lumbar region: Secondary | ICD-10-CM

## 2014-11-07 NOTE — Patient Instructions (Signed)
   Alrick Cubbage PT, DPT, LAT, ATC  [facility] Phone: [phone]     

## 2014-11-07 NOTE — Therapy (Signed)
[place], [place], [address] Phone: [phone]   Fax:  [phone]  Physical Therapy Treatment  Patient Details  Name: Jaime Boone MRN: [id number] Date of Birth: 1940-08-11 Referring Provider:  Prince Solian, MD  Encounter Date: 11/07/2014      PT End of Session - 11/07/14 1206    Visit Number 5   Number of Visits 12   Date for PT Re-Evaluation 11/20/14   Authorization Type Medicare / medicaid secondary   PT Start Time 0930   PT Stop Time 1015   PT Time Calculation (min) 45 min   Activity Tolerance Patient tolerated treatment well   Behavior During Therapy [facility] for tasks assessed/performed      Past Medical History  Diagnosis Date  . Diabetes mellitus without complication   . Asthma   . Arthritis   . Hypertension   . Acid reflux   . Hepatitis C   . Degenerative joint disease   . 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 Cirrhosis     Past Surgical History  Procedure Laterality Date  . Gallbladder surgery    . Foot surgery      BUNIONECTOMY  . Nose surgery    . Eye surgery      CATARACT  . Dilation and curettage of uterus    . Appendectomy    . Abdominal hysterectomy      TAH    There were no vitals filed for this visit.  Visit Diagnosis:  Weakness of both legs  Midline low back pain without sciatica  Decreased ROM of lumbar spine  Muscle spasm of back  Right hip pain      Subjective Assessment - 11/07/14 0932    Subjective "I am doing really well and havent had any pain, and have been walking more"   Currently in Pain? No/denies   Pain Score 0-No pain   Pain Location Back                         [facility] Adult PT Treatment/Exercise - 11/07/14 0935    Static Standing Balance   Tandem Stance - Right Leg 30  2 x   Tandem Stance - Left Leg 30  2 x    Rhomberg - Eyes Opened 30  x 1   Rhomberg - Eyes Closed 30  x 2,  demonstrating minimal postural sway   High Level Balance   High Level Balance Activities Tandem walking;Head turns;Marching forwards   Lumbar Exercises: Aerobic   Stationary Bike Nu Step L7 x 10 min   Lumbar Exercises: Supine   Ab Set 10 reps  holding for 5 seconds   Bridge 10 reps   Other Supine Lumbar Exercises abdominal set with ball squeeze   Knee/Hip Exercises: Machines for Strengthening   Cybex Leg Press 1 plate   Knee/Hip Exercises: Standing   Heel Raises Both;2 sets;15 reps   Forward Step Up Both;1 set;10 reps;Hand Hold: 2;Step Height: 6"   Wall Squat 2 sets;10 reps   Other Standing Knee Exercises hip abduction, extension 2 x 10   3#   Knee/Hip Exercises: Seated   Long Arc Quad AROM;Strengthening;Both;2 sets;10 reps  4#   Marching Weights 4 lbs.  x 20    Sit to Sand 1 set;10 reps                  PT Short Term Goals -  11/07/14 1210    PT SHORT TERM GOAL #1   Title pt will be I with basic HEP (10/30/2014)   Time 3   Period Weeks   Status Achieved   PT SHORT TERM GOAL #2   Title pt will increase her FOTO score by > 5 points to demonstrate increased functional capacity (10/30/2014)   Time 3   Period Weeks   Status Unable to assess   PT SHORT TERM GOAL #3   Title she will be able to verbalize and demonstrate techniques to reduce hip/low back reinjury via postural awareness, lifting and carrying mechanics and HEP (10/30/2014)   Time 3   Period Weeks   Status Achieved           PT Long Term Goals - 11/07/14 1210    PT LONG TERM GOAL #1   Title upon discharge pt will be I with all HEP given throughout therapy (11/20/2014)   Time 6   Period Weeks   Status On-going   PT LONG TERM GOAL #2   Title pt will increase bil LE strenght to >4/5 to assist walking endurance and safety (11/20/2014)   Time 6   Period Weeks   Status On-going   PT LONG TERM GOAL #3   Title she will demontrate < 2/10 pain during and following prolonged walking/standing for > 30 min, and  ascending/descend > 12 steps to help with community amb (11/20/2014)   Time 6   Period Weeks   Status On-going   PT LONG TERM GOAL #4   Title She will increase trunk mobility in all planes by > 10 degrees to help with ADLs (11/20/2014)   Time 6   Period Weeks   Status On-going   PT LONG TERM GOAL #5   Title  pt will demonstate FOTO score of 57 to demonstrate improved functional capacity upon discharge (11/20/2014)    Time 6   Period Weeks   Status On-going               Plan - 11/07/14 1207    Clinical Impression Statement Jaime Boone reports that she has been doing better since the last visit and has been walking longer. She was able to perform all exercises well without pain with only complaint of feeling fatigue. She MET STG 1 and 3 today. She reports she plans on joining a gym and doing aquatic exercise when she is done with therapy.    PT Next Visit Plan [facility] exercises, static/dynamic balance training, assess goals/FOTO   Consulted and Agree with Plan of Care Patient        Problem List Patient Active Problem List   Diagnosis Date Noted  . Sebaceous cyst of labia 04/06/2012  . History of hepatitis C 04/06/2012  . Constipation 04/06/2012  . Postmenopausal 03/01/2012  . Vaginal atrophy 03/01/2012   Starr Lake PT, DPT, LAT, ATC  11/07/2014  12:16 PM      [facility] [address] [place], [place], [address] Phone: [phone]   Fax:  [phone]

## 2014-11-12 ENCOUNTER — Ambulatory Visit: Payer: Medicaid Other | Admitting: Physical Therapy

## 2014-11-12 DIAGNOSIS — M545 Low back pain, unspecified: Secondary | ICD-10-CM

## 2014-11-12 DIAGNOSIS — M25551 Pain in right hip: Secondary | ICD-10-CM

## 2014-11-12 DIAGNOSIS — R29898 Other symptoms and signs involving the musculoskeletal system: Secondary | ICD-10-CM

## 2014-11-12 DIAGNOSIS — M5386 Other specified dorsopathies, lumbar region: Secondary | ICD-10-CM

## 2014-11-12 DIAGNOSIS — M62830 Muscle spasm of back: Secondary | ICD-10-CM

## 2014-11-12 NOTE — Therapy (Signed)
[place], [place], [address] Phone: [phone]   Fax:  [phone]  Physical Therapy Treatment  Patient Details  Name: Jaime Boone MRN: [id number] Date of Birth: June 03, 1940 Referring Provider:  Prince Solian, MD  Encounter Date: 11/12/2014      PT End of Session - 11/12/14 1738    Visit Number 7   Number of Visits 12   Date for PT Re-Evaluation 11/20/14      Past Medical History  Diagnosis Date  . Diabetes mellitus without complication   . Asthma   . Arthritis   . Hypertension   . Acid reflux   . Hepatitis C   . Degenerative joint disease   . 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 Cirrhosis     Past Surgical History  Procedure Laterality Date  . Gallbladder surgery    . Foot surgery      BUNIONECTOMY  . Nose surgery    . Eye surgery      CATARACT  . Dilation and curettage of uterus    . Appendectomy    . Abdominal hysterectomy      TAH    There were no vitals filed for this visit.  Visit Diagnosis:  Weakness of both legs  Decreased ROM of lumbar spine  Muscle spasm of back  Right hip pain  Midline low back pain without sciatica      Subjective Assessment - 11/12/14 1109    Subjective Stiff today.  Had a spasm with lower trunk rotations,.. Checked on water arthritis exercises.  It was 150. for 1 . 5 months.   She will check into other options for the water.   Bunion pain RT at night , flexing toes intermittant   Moderate pain.   Currently in Pain? No/denies   Pain Score --  7-8/10   Pain Location Hip   Pain Orientation Left   Pain Descriptors / Indicators Spasm   Pain Type Chronic pain   Aggravating Factors  Lower trunk rotation.   Pain Relieving Factors change of activity.  Warm water.   Multiple Pain Sites No                         [facility] Adult PT Treatment/Exercise - 11/12/14 1117    Lumbar Exercises: Stretches   Passive Hamstring Stretch 3 reps;30 seconds   Single Knee to Chest Stretch 3 reps;10 seconds  charlie horse.   Lower Trunk Rotation 5 reps   Lumbar Exercises: Aerobic   Stationary Bike Nu step   L 7, 5 minutes  5 minutes.     Lumbar Exercises: Prone   Other Prone Lumbar Exercises multifitus 10 reps pelvis press 5 second holds.   Knee/Hip Exercises: Standing   Heel Raises Both;2 sets   Hip Abduction 10 reps;2 sets  both   Hip Extension 10 reps;2 sets  Both   Forward Step Up Both;1 set;10 reps;Hand Hold: 2;Step Height: 6"                  PT Short Term Goals - 11/12/14 1739    PT SHORT TERM GOAL #1   Title pt will be I with basic HEP (10/30/2014)   Time 3   Status Achieved   PT SHORT TERM GOAL #2   Title pt will increase her FOTO score by > 5 points to demonstrate increased functional capacity (10/30/2014)   Time 3  Period Weeks   Status Unable to assess   PT SHORT TERM GOAL #3   Title she will be able to verbalize and demonstrate techniques to reduce hip/low back reinjury via postural awareness, lifting and carrying mechanics and HEP (10/30/2014)   Time 3   Period Weeks   Status Achieved           PT Long Term Goals - 11/12/14 1739    PT LONG TERM GOAL #1   Title upon discharge pt will be I with all HEP given throughout therapy (11/20/2014)   Time 6   Period Weeks   Status On-going   PT LONG TERM GOAL #2   Title pt will increase bil LE strenght to >4/5 to assist walking endurance and safety (11/20/2014)   Time 6   Period Weeks   Status On-going   PT LONG TERM GOAL #3   Title she will demontrate < 2/10 pain during and following prolonged walking/standing for > 30 min, and ascending/descend > 12 steps to help with community amb (11/20/2014)   Time 6   Period Weeks   Status On-going   PT LONG TERM GOAL #4   Title She will increase trunk mobility in all planes by > 10 degrees to help with ADLs (11/20/2014)   Time 6   Period Weeks   Status On-going   PT LONG  TERM GOAL #5   Title  pt will demonstate FOTO score of 57 to demonstrate improved functional capacity upon discharge (11/20/2014)    Time 6   Period Weeks   Status Unable to assess               Plan - 11/12/14 1335    Clinical Impression Statement Aquatic exercise class is too expensive.  She will look into exercising in water own her own.  Tried to do  FOTO but she started and did not complete. Emotional support given during treatment encouraging her positively.         Problem List Patient Active Problem List   Diagnosis Date Noted  . Sebaceous cyst of labia 04/06/2012  . History of hepatitis C 04/06/2012  . Constipation 04/06/2012  . Postmenopausal 03/01/2012  . Vaginal atrophy 03/01/2012    [facility] 11/12/2014, 5:41 PM  [facility] [address] [place], [place], [address] Phone: [phone]   Fax:  [phone]     Melvenia Needles, PTA 11/12/2014 5:41 PM Phone: [phone] Fax: [phone]

## 2014-11-14 ENCOUNTER — Ambulatory Visit: Payer: Medicaid Other | Admitting: Physical Therapy

## 2014-11-14 DIAGNOSIS — M62830 Muscle spasm of back: Secondary | ICD-10-CM

## 2014-11-14 DIAGNOSIS — M545 Low back pain, unspecified: Secondary | ICD-10-CM

## 2014-11-14 DIAGNOSIS — M25551 Pain in right hip: Secondary | ICD-10-CM

## 2014-11-14 DIAGNOSIS — R29898 Other symptoms and signs involving the musculoskeletal system: Secondary | ICD-10-CM | POA: Diagnosis not present

## 2014-11-14 DIAGNOSIS — M5386 Other specified dorsopathies, lumbar region: Secondary | ICD-10-CM

## 2014-11-14 NOTE — Therapy (Signed)
[place], [place], [address] Phone: [phone]   Fax:  [phone]  Physical Therapy Treatment  Patient Details  Name: Jaime Boone MRN: [id number] Date of Birth: [birth year] Referring Provider:  Prince Solian, MD  Encounter Date: 11/14/2014      PT End of Session - 11/14/14 1142    Visit Number 8   Number of Visits 12   Date for PT Re-Evaluation 11/20/14   Authorization Type Medicare / medicaid secondary   PT Start Time 1100   PT Stop Time 1150   PT Time Calculation (min) 50 min      Past Medical History  Diagnosis Date  . Diabetes mellitus without complication   . Asthma   . Arthritis   . Hypertension   . Acid reflux   . Hepatitis C   . Degenerative joint disease   . 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 Cirrhosis     Past Surgical History  Procedure Laterality Date  . Gallbladder surgery    . Foot surgery      BUNIONECTOMY  . Nose surgery    . Eye surgery      CATARACT  . Dilation and curettage of uterus    . Appendectomy    . Abdominal hysterectomy      TAH    There were no vitals filed for this visit.  Visit Diagnosis:  Weakness of both legs  Decreased ROM of lumbar spine  Muscle spasm of back  Right hip pain  Midline low back pain without sciatica      Subjective Assessment - 11/14/14 1105    Subjective "I've been doing well since the last visit, with no pain or stiffness" pt reports planning on going to checkout the gym after todays visit.    Currently in Pain? No/denies   Pain Score 0-No pain   Pain Location Hip   Pain Orientation Left   Pain Score 0   Pain Location Hip   Pain Orientation Right            [facility] PT Assessment - 11/14/14 0001    Observation/Other Assessments   Focus on Therapeutic Outcomes (FOTO)  45%   AROM   Lumbar Flexion 60   Lumbar Extension 28   Lumbar - Right Side Bend 30   Lumbar - Left Side  Bend 32   Lumbar - Right Rotation 50%   Lumbar - Left Rotation 45%   Strength   Right Hip Flexion 4-/5   Right Hip ABduction 4/5   Right Hip ADduction 4-/5   Left Hip Flexion 4-/5   Left Hip ABduction 4/5   Left Hip ADduction 4-/5   Right Knee Flexion 4-/5   Right Knee Extension 4/5   Left Knee Flexion 4-/5   Left Knee Extension 4/5                     [facility] Adult PT Treatment/Exercise - 11/14/14 1107    Lumbar Exercises: Aerobic   Stationary Bike Nu step   L 7, 8 minutes   Lumbar Exercises: Prone   Other Prone Lumbar Exercises multifitus 10 reps pelvis press 5 second holds.   Knee/Hip Exercises: Machines for Strengthening   Hip Cybex hip add/abd/ext 2 plates x 15 ea.   Knee/Hip Exercises: Standing   Heel Raises Both;2 sets;15 reps                PT  Education - 11/14/14 1141    Education provided Yes   Education Details HEP review and education on progress   Person(s) Educated Patient   Methods Explanation   Comprehension Verbalized understanding          PT Short Term Goals - 11/14/14 1128    PT SHORT TERM GOAL #1   Title pt will be I with basic HEP (10/30/2014)   Time 3   Period Weeks   Status Achieved   PT SHORT TERM GOAL #2   Title pt will increase her FOTO score by > 5 points to demonstrate increased functional capacity (10/30/2014)   Baseline discharge at 71   Time 3   Period Weeks   Status Achieved   PT SHORT TERM GOAL #3   Title she will be able to verbalize and demonstrate techniques to reduce hip/low back reinjury via postural awareness, lifting and carrying mechanics and HEP (10/30/2014)   Time 3   Period Weeks   Status Achieved           PT Long Term Goals - 11/14/14 1129    PT LONG TERM GOAL #1   Title upon discharge pt will be I with all HEP given throughout therapy (11/20/2014)   Time 6   Period Weeks   Status Achieved   PT LONG TERM GOAL #2   Title pt will increase bil LE strenght to >4/5 to assist walking endurance  and safety (11/20/2014)   Time 6   Period Weeks   Status Partially Met   PT LONG TERM GOAL #3   Title she will demontrate < 2/10 pain during and following prolonged walking/standing for > 30 min, and ascending/descend > 12 steps to help with community amb (11/20/2014)   Time 6   Period Weeks   Status Achieved   PT LONG TERM GOAL #4   Title She will increase trunk mobility in all planes by > 10 degrees to help with ADLs (11/20/2014)   Time 6   Period Weeks   Status Achieved   PT LONG TERM GOAL #5   Title  pt will demonstate FOTO score of 57 to demonstrate improved functional capacity upon discharge (11/20/2014)    Baseline made to score of 55   Time 6   Period Weeks   Status Partially Met               Plan - 11/14/14 1142    Clinical Impression Statement Tennelle has made great progress with therapy and has improved her trunk mobility in all planes and has increased her strength in bil le. She met all goals except for partially meeting LTG #2, #5. She continues to exhibit some weakness but reports she plans on joining a gym following todays visit.  She reports she is able to maintain and progress her current level of function independently and no longer requires skilled physical therapy and will be discharged today.   PT Next Visit Plan discharge today   PT Home Exercise Plan HEP review   Consulted and Agree with Plan of Care Patient        Problem List Patient Active Problem List   Diagnosis Date Noted  . Sebaceous cyst of labia 04/06/2012  . History of hepatitis C 04/06/2012  . Constipation 04/06/2012  . Postmenopausal 03/01/2012  . Vaginal atrophy 03/01/2012                     PHYSICAL THERAPY DISCHARGE SUMMARY  Visits from  Start of Care: 8  Current functional level related to goals / functional outcomes: FOTO 45% limited   Remaining deficits: Mild weakness in bil le,    Education / Equipment: HEP education, theraband equipment  Plan: Patient  agrees to discharge.  Patient goals were partially met. Patient is being discharged due to being pleased with the current functional level.  ?????         Starr Lake PT, DPT, LAT, ATC  11/14/2014  11:50 AM      [facility] [address] [place], [place], [address] Phone: [phone]   Fax:  [phone]

## 2014-11-19 ENCOUNTER — Ambulatory Visit
Admission: RE | Admit: 2014-11-19 | Discharge: 2014-11-19 | Disposition: A | Discharge status: Home / Self Care | Payer: Medicaid Other | Source: Ambulatory Visit

## 2014-11-19 DIAGNOSIS — Z1231 Encounter for screening mammogram for malignant neoplasm of breast: Secondary | ICD-10-CM

## 2015-03-07 ENCOUNTER — Ambulatory Visit (INDEPENDENT_AMBULATORY_CARE_PROVIDER_SITE_OTHER): Payer: Self-pay | Admitting: Gynecology

## 2015-03-08 NOTE — Progress Notes (Signed)
Cancelled appointment

## 2015-03-14 ENCOUNTER — Ambulatory Visit (INDEPENDENT_AMBULATORY_CARE_PROVIDER_SITE_OTHER): Payer: Medicare Other | Admitting: Gynecology

## 2015-03-14 ENCOUNTER — Other Ambulatory Visit (HOSPITAL_COMMUNITY)
Admission: RE | Admit: 2015-03-14 | Discharge: 2015-03-14 | Disposition: A | Discharge status: Home / Self Care | Payer: Medicare Other | Source: Ambulatory Visit | Attending: Gynecology | Admitting: Gynecology

## 2015-03-14 ENCOUNTER — Encounter: Payer: Self-pay | Admitting: Gynecology

## 2015-03-14 VITALS — BP 112/74

## 2015-03-14 DIAGNOSIS — Z01419 Encounter for gynecological examination (general) (routine) without abnormal findings: Secondary | ICD-10-CM

## 2015-03-14 DIAGNOSIS — N898 Other specified noninflammatory disorders of vagina: Secondary | ICD-10-CM

## 2015-03-14 DIAGNOSIS — N952 Postmenopausal atrophic vaginitis: Secondary | ICD-10-CM | POA: Diagnosis not present

## 2015-03-14 DIAGNOSIS — Z8619 Personal history of other infectious and parasitic diseases: Secondary | ICD-10-CM

## 2015-03-14 DIAGNOSIS — Z124 Encounter for screening for malignant neoplasm of cervix: Secondary | ICD-10-CM | POA: Diagnosis not present

## 2015-03-14 DIAGNOSIS — Z1151 Encounter for screening for human papillomavirus (HPV): Secondary | ICD-10-CM | POA: Diagnosis present

## 2015-03-14 NOTE — Progress Notes (Signed)
Jaime Boone [birth year] [id number]   History:    74 y.o.  for annual gyn exam with no complaints today.Patient is being followed by her primary care physician Dr. Radene Gunning who is doing her blood work. 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 On questioning she believes she got her hepatitis C either from a transfusion in the 1960s or from her partner who was a heroin addict. Patient is not sexually active.  She moved to [facility] from [place] around September 2009. She reports having had colonoscopy in 2005 and again in 2008. Last examination, by her report, was negative with followup in 10 years recommended. She also underwent upper endoscopy, by her report, 2009. Reportedly unremarkable.  For her history of hepatitis C she had been seen by the medical specialties clinic from [facility]. Now, the medical specialties clinic from [place]. Patient denies any past history of any abnormal Pap smear. Patient's flu vaccine, Pneumovax and shingles vaccines are up-to-date. Her PCP has been doing her blood work.  Bone density study normal 2016.  Past medical history,surgical history, family history and social history were all reviewed and documented in the EPIC chart.  Gynecologic History No LMP recorded. Patient is postmenopausal. Contraception: post menopausal status Last Pap: 2015. Results were: normal Last mammogram: 2016. Results were: normal  Obstetric History OB History  Gravida Para Term Preterm AB SAB TAB Ectopic Multiple Living  6 3 3  3 3    3     # Outcome Date GA Lbr Len/2nd Weight Sex Delivery Anes PTL Lv  6 SAB           5 SAB           4 SAB           3 Term     F Vag-Spont  N Y  2 Term     F Vag-Spont  N Y  1 Term     M Vag-Spont  N Y       ROS: A ROS was performed and pertinent positives and negatives are included in the history.  GENERAL: No fevers or chills. HEENT: No change in vision, no earache,  sore throat or sinus congestion. NECK: No pain or stiffness. CARDIOVASCULAR: No chest pain or pressure. No palpitations. PULMONARY: No shortness of breath, cough or wheeze. GASTROINTESTINAL: No abdominal pain, nausea, vomiting or diarrhea, melena or bright red blood per rectum. GENITOURINARY: No urinary frequency, urgency, hesitancy or dysuria. MUSCULOSKELETAL: No joint or muscle pain, no back pain, no recent trauma. DERMATOLOGIC: No rash, no itching, no lesions. ENDOCRINE: No polyuria, polydipsia, no heat or cold intolerance. No recent change in weight. HEMATOLOGICAL: No anemia or easy bruising or bleeding. NEUROLOGIC: No headache, seizures, numbness, tingling or weakness. PSYCHIATRIC: No depression, no loss of interest in normal activity or change in sleep pattern.     Exam: chaperone present  BP 112/74 mmHg  There is no weight on file to calculate BMI.  General appearance : Well developed well nourished female. No acute distress HEENT: Eyes: no retinal hemorrhage or exudates,  Neck supple, trachea midline, no carotid bruits, no thyroidmegaly Lungs: Clear to auscultation, no rhonchi or wheezes, or rib retractions  Heart: Regular rate and rhythm, no murmurs or gallops Breast:Examined in sitting and supine position were symmetrical in appearance, no palpable masses or tenderness,  no skin retraction, no nipple inversion, no nipple discharge, no skin discoloration, no axillary or supraclavicular lymphadenopathy Abdomen: no  palpable masses or tenderness, no rebound or guarding Extremities: no edema or skin discoloration or tenderness  Pelvic:  Bartholin, Urethra, Skene Glands: Within normal limits             Vagina: Right vaginal sidewall near fornix growth-like lesion noted  Cervix: No gross lesions or discharge  Uterus  anteverted, normal size, shape and consistency, non-tender and mobile  Adnexa  Without masses or tenderness  Anus and perineum  normal   Rectovaginal  normal sphincter tone  without palpated masses or tenderness             Hemoccult PCP provides     Assessment/Plan:  74 y.o. female for annual exam will return back to the office next months for colposcopy to evaluate this redundant vaginal tissue noted at the right vaginal fornix. Slightly friable on contact. Pap smear with HPV screening done today. PCP doing her blood work. Vaccines up-to-date. Normal bone density study in 2016.   Terrance Mass MD, 10:18 AM 03/14/2015

## 2015-03-15 LAB — CYTOLOGY - PAP

## 2015-04-03 ENCOUNTER — Other Ambulatory Visit: Payer: Self-pay | Admitting: Nurse Practitioner

## 2015-04-03 DIAGNOSIS — B182 Chronic viral hepatitis C: Secondary | ICD-10-CM

## 2015-04-03 DIAGNOSIS — K7400 Hepatic fibrosis, unspecified: Secondary | ICD-10-CM

## 2015-04-17 ENCOUNTER — Encounter: Payer: Self-pay | Admitting: Gynecology

## 2015-04-17 ENCOUNTER — Ambulatory Visit (INDEPENDENT_AMBULATORY_CARE_PROVIDER_SITE_OTHER): Payer: Medicare Other | Admitting: Gynecology

## 2015-04-17 VITALS — BP 128/76

## 2015-04-17 DIAGNOSIS — N898 Other specified noninflammatory disorders of vagina: Secondary | ICD-10-CM | POA: Diagnosis not present

## 2015-04-17 DIAGNOSIS — L570 Actinic keratosis: Secondary | ICD-10-CM

## 2015-04-17 MED ORDER — CLOBETASOL PROPIONATE 0.05 % EX CREA: 1.0000 "application " | TOPICAL_CREAM | Freq: Two times a day (BID) | CUTANEOUS | Status: DC

## 2015-04-17 NOTE — Patient Instructions (Signed)
Actinic Keratosis Actinic keratosis is a precancerous growth on the skin. This means it could develop into skin cancer if it is not treated. About 1% of actinic keratoses turn into skin cancer within a year. It is important to have all such growths removed to prevent them from developing into skin cancer. CAUSES  Actinic keratosis is caused by getting too much ultraviolet (UV) radiation from the sun or other UV light sources. RISK FACTORS Factors that increase your chances of getting actinic keratosis include:  Having light-colored skin and blue eyes.  Having blonde or red hair.  Spending a lot of time in the sun.  Age. The risk of actinic keratosis increases with age. SYMPTOMS  Actinic keratosis growths look like scaly, rough spots of skin. They can be as small as a pinhead or as big as a quarter. They may itch, hurt, or feel sensitive. Sometimes there is a little tag of pink or gray skin growing off them. In some cases, actinic keratoses are easier felt than seen. They do not go away with the use of moisturizing lotions or creams. Actinic keratoses appear most often on areas of skin that get a lot of sun exposure. These areas include the:  Scalp.  Face.  Ears.  Lips.  Upper back.  Backs of the hands.  Forearms. DIAGNOSIS  Your health care provider can usually tell what is wrong by performing a physical exam. A tissue sample (biopsy) may also be taken and examined under a microscope. TREATMENT  Actinic keratosis can be treated several ways. Most treatments can be done in your health care provider's office. Treatment options may include:  Curettage. A tool is used to gently scrape off the growth.  Cryosurgery. Liquid nitrogen is applied to the growth to freeze it. The growth eventually falls off the skin.  Medicated creams, such as 5-fluorouracil or imiquimod. The medicine destroys the cells in the growth.  Chemical peels. Chemicals are applied to the growth and the outer  layers of skin are peeled off.  Photodynamic therapy. A drug that makes your skin more sensitive to light is applied to the skin. A strong, blue light is aimed at the skin and destroys the growth. PREVENTION  To prevent future sun damage:  Try to avoid the sun between 10:00 a.m. and 4:00 p.m. when it is the strongest.  Use a sunscreen or sunblock with SPF 30 or greater.  Apply sunscreen at least 30 minutes before exposure to the sun.  Always wear protective hats, clothing, and sunglasses with UV protection.  Avoid medicines, herbs, and foods that increase your sensitivity to sunlight.  Avoid tanning beds. HOME CARE INSTRUCTIONS   If your skin was covered with a bandage, change and remove the bandage as directed by your health care provider.  Keep the treated area dry as directed by your health care provider.  Apply any creams as prescribed by your health care provider. Follow the directions carefully.  Check your skin regularly for any changes.  Visit a skin doctor (dermatologist) every year for a skin exam. SEEK MEDICAL CARE IF:   Your skin does not heal and becomes irritated, red, or bleeds.  You notice any changes or new growths on your skin.   This information is not intended to replace advice given to you by your health care provider. Make sure you discuss any questions you have with your health care provider.   Document Released: 06/12/2008 Document Revised: 04/06/2014 Document Reviewed: 04/27/2011 Elsevier Interactive Patient Education 2016 Elsevier   Inc. COLPOSCOPY POST-PROCEDURE INSTRUCTIONS  1. You may take Ibuprofen, Aleve or Tylenol for cramping if needed.  2. If Monsel's solution was used, you will have a black discharge.  3. Light bleeding is normal.  If bleeding is heavier than your period, please call.  4. Put nothing in your vagina until the bleeding or discharge stops (usually 2 or3 days).  5. We will call you within one week with biopsy results or  discuss the results at your follow-up appointment if needed. 6.

## 2015-04-17 NOTE — Progress Notes (Signed)
   Patient is a 75 year old that was seen in the office for her annual exam on 03/14/2015  Whereby a vaginal lesion was seen near the vaginal cuff.  Her Pap smear during that visit was normal. She was asked to return to the office for colposcopic evaluation and possible biopsy.  Patient would no prior history of any abnormal Pap smear.Patient with history of hypertension, insulin dependent diabetes and hepatitis C with possible cirrhosis. On questioning she believes she got her hepatitis C either from a transfusion in the 1960s or from her partner who was a heroin addict. Patient is not sexually active. Her hepatitis C is being manage by specialty clinic from [facility]. Now the medical specialists clinic from [place] or managing her.   Patient underwent a detail colposcopic evaluation of the external genitalia, perineum and perirectal region no lesions were seen. The speculum was then introduced into the vagina. Acetic acid was applied a systematic inspection vagina demonstrated no lesion well was seen before during annual exam was a vaginal mucosal tag was not hyperemic no discoloration the texture and color blended with irregular mucosa.   Assessment/plan: patient with incidental finding during annual exam in December appears to be a mucosal fold benign in appearance. Normal Pap smear prior history of hysterectomy and no prior history of abnormal Pap smears. Detail colposcopic evaluation otherwise normal. Patient was reassured. Patient otherwise return to the office in one year or when necessary.

## 2015-05-08 ENCOUNTER — Ambulatory Visit
Admission: RE | Admit: 2015-05-08 | Discharge: 2015-05-08 | Disposition: A | Discharge status: Home / Self Care | Payer: Medicare Other | Source: Ambulatory Visit | Attending: Nurse Practitioner | Admitting: Nurse Practitioner

## 2015-05-08 DIAGNOSIS — K7400 Hepatic fibrosis, unspecified: Secondary | ICD-10-CM

## 2015-05-08 DIAGNOSIS — B182 Chronic viral hepatitis C: Secondary | ICD-10-CM

## 2015-05-30 DIAGNOSIS — Z0000 Encounter for general adult medical examination without abnormal findings: Secondary | ICD-10-CM | POA: Insufficient documentation

## 2015-06-05 ENCOUNTER — Other Ambulatory Visit: Payer: Self-pay

## 2015-06-05 MED ORDER — CLOBETASOL PROPIONATE 0.05 % EX CREA: 1.0000 "application " | TOPICAL_CREAM | Freq: Two times a day (BID) | CUTANEOUS | Status: DC

## 2015-06-06 DIAGNOSIS — E785 Hyperlipidemia, unspecified: Secondary | ICD-10-CM | POA: Insufficient documentation

## 2015-06-26 ENCOUNTER — Encounter: Payer: Self-pay | Admitting: Gynecology

## 2015-06-26 ENCOUNTER — Ambulatory Visit (INDEPENDENT_AMBULATORY_CARE_PROVIDER_SITE_OTHER): Payer: Medicare Other | Admitting: Gynecology

## 2015-06-26 VITALS — BP 124/84

## 2015-06-26 DIAGNOSIS — N764 Abscess of vulva: Secondary | ICD-10-CM

## 2015-06-26 NOTE — Patient Instructions (Signed)
Continue with warm compresses to the boil area.I anticipate this area will drain over the next several days. If it continues to be bothersome then follow up next week with office visit.

## 2015-06-26 NOTE — Progress Notes (Signed)
    Jaime Boone [birth year] [id number]        75 y.o.  [id number] Patient presents with a several week history of vulvar boil. Had a separate area that came and went and then the present boil developed and has lingered. She saw her primary physician who prescribed doxycycline and she is at the end of this treatment. Notes that it feels a lot better without significant pain.  Past medical history,surgical history, problem list, medications, allergies, family history and social history were all reviewed and documented in the EPIC chart.  Directed ROS with pertinent positives and negatives documented in the history of present illness/assessment and plan.  Exam: Caryn Bee assistant Filed Vitals:   06/26/15 1051  BP: 124/84   General appearance:  Normal Abdomen soft nontender without masses guarding rebound Pelvic external BUS vagina with atrophic changes. Small pointing boil right perineal body region. No significant inflammation. Vagina with atrophic changes. Bimanual without masses or tenderness Physical Exam  Genitourinary:        Assessment/Plan:  75 y.o. [id number] with resolving right vulvar boil. Slightly fluctuant. Options to lance versus continue with conservative treatment with warm soaks discussed.  As she is feeling better and it appears to be starting to point I recommended warm soaks several times daily and I anticipate this will drain in the next several days. If it does continue into next week then she'll represent to have this area lanced.    Anastasio Auerbach MD, 11:02 AM 06/26/2015

## 2015-10-21 ENCOUNTER — Other Ambulatory Visit: Payer: Self-pay | Admitting: Internal Medicine

## 2015-10-21 DIAGNOSIS — Z1231 Encounter for screening mammogram for malignant neoplasm of breast: Secondary | ICD-10-CM

## 2015-11-25 ENCOUNTER — Ambulatory Visit
Admission: RE | Admit: 2015-11-25 | Discharge: 2015-11-25 | Disposition: A | Discharge status: Home / Self Care | Payer: Medicare Other | Source: Ambulatory Visit | Attending: Internal Medicine | Admitting: Internal Medicine

## 2015-11-25 DIAGNOSIS — Z1231 Encounter for screening mammogram for malignant neoplasm of breast: Secondary | ICD-10-CM

## 2016-03-16 ENCOUNTER — Encounter: Payer: Self-pay | Admitting: Gynecology

## 2016-03-16 ENCOUNTER — Ambulatory Visit (INDEPENDENT_AMBULATORY_CARE_PROVIDER_SITE_OTHER): Payer: Medicare Other | Admitting: Gynecology

## 2016-03-16 VITALS — BP 124/70 | Ht 62.0 in | Wt 209.0 lb

## 2016-03-16 DIAGNOSIS — Z01419 Encounter for gynecological examination (general) (routine) without abnormal findings: Secondary | ICD-10-CM | POA: Diagnosis not present

## 2016-03-16 DIAGNOSIS — Z780 Asymptomatic menopausal state: Secondary | ICD-10-CM | POA: Diagnosis not present

## 2016-03-16 NOTE — Progress Notes (Signed)
Jaime Boone Nov 06, 1940 [id number]   History:    75 y.o.  for annual gyn exam  who is asymptomatic. Her PCP who has been doing her blood work is Dr. Doneen Poisson. 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 On questioning she believes she got her hepatitis C either from a transfusion in the 1960s or from her partner who was a heroin addict. Patient is not sexually active.  She moved to [facility] from [place] around September 2009. She reports having had colonoscopy in 2005 and again in 2008. Last examination, by her report, was negative with followup in 10 years recommended. She also underwent upper endoscopy, by her report, 2009. Reportedly unremarkable.  For her history of hepatitis C she had been seen by the medical specialties clinic from [facility]. Now, the medical specialties clinic from [place]. Patient denies any past history of any abnormal Pap smear. Patient's flu vaccine, Pneumovax and shingles vaccines are up-to-date. Her PCP has been doing her blood work.  Bone density study normal 2016.  Past medical history,surgical history, family history and social history were all reviewed and documented in the EPIC chart.  Gynecologic History No LMP recorded. Patient is postmenopausal. Contraception: post menopausal status Last Pap: 2016. Results were: normal Last mammogram: 2017. Results were: normal  Obstetric History OB History  Gravida Para Term Preterm AB Living  6 3 3   3 3   SAB TAB Ectopic Multiple Live Births  3       3    # Outcome Date GA Lbr Len/2nd Weight Sex Delivery Anes PTL Lv  6 SAB           5 SAB           4 SAB           3 Term     F Vag-Spont  N LIV  2 Term     F Vag-Spont  N LIV  1 Term     M Vag-Spont  N LIV       ROS: A ROS was performed and pertinent positives and negatives are included in the history.  GENERAL: No fevers or chills. HEENT: No change in vision, no earache, sore throat  or sinus congestion. NECK: No pain or stiffness. CARDIOVASCULAR: No chest pain or pressure. No palpitations. PULMONARY: No shortness of breath, cough or wheeze. GASTROINTESTINAL: No abdominal pain, nausea, vomiting or diarrhea, melena or bright red blood per rectum. GENITOURINARY: No urinary frequency, urgency, hesitancy or dysuria. MUSCULOSKELETAL: No joint or muscle pain, no back pain, no recent trauma. DERMATOLOGIC: No rash, no itching, no lesions. ENDOCRINE: No polyuria, polydipsia, no heat or cold intolerance. No recent change in weight. HEMATOLOGICAL: No anemia or easy bruising or bleeding. NEUROLOGIC: No headache, seizures, numbness, tingling or weakness. PSYCHIATRIC: No depression, no loss of interest in normal activity or change in sleep pattern.     Exam: chaperone present  BP 124/70   Ht 5\' 2"  (1.575 m)   Wt 209 lb (94.8 kg)   BMI 38.23 kg/m   Body mass index is 38.23 kg/m.  General appearance : Well developed well nourished female. No acute distress HEENT: Eyes: no retinal hemorrhage or exudates,  Neck supple, trachea midline, no carotid bruits, no thyroidmegaly Lungs: Clear to auscultation, no rhonchi or wheezes, or rib retractions  Heart: Regular rate and rhythm, no murmurs or gallops Breast:Examined in sitting and supine position were symmetrical in appearance, no palpable masses or  tenderness,  no skin retraction, no nipple inversion, no nipple discharge, no skin discoloration, no axillary or supraclavicular lymphadenopathy Abdomen: no palpable masses or tenderness, no rebound or guarding Extremities: no edema or skin discoloration or tenderness  Pelvic:  Bartholin, Urethra, Skene Glands: Within normal limits             Vagina: No gross lesions or discharge atrophic changes  Cervix: No gross lesions or discharge  Uterus  anteverted, normal size, shape and consistency, non-tender and mobile  Adnexa  Without masses or tenderness  Anus and perineum  normal   Rectovaginal   normal sphincter tone without palpated masses or tenderness             Hemoccult PCP provides     Assessment/Plan:  75 y.o. female for annual exam will return to the office in February for her follow-up bone density study. We discussed importance of calcium vitamin D and weightbearing exercises for osteoporosis prevention. Patient no longer needs Pap smears according to the new guidelines. Her PCP has been doing her blood work and her vaccines are up-to-date.   Terrance Mass MD, 11:09 AM 03/16/2016

## 2016-03-16 NOTE — Patient Instructions (Signed)
Bone Densitometry Introduction Bone densitometry is an imaging test that uses a special X-ray to measure the amount of calcium and other minerals in your bones (bone density). This test is also known as a bone mineral density test or dual-energy X-ray absorptiometry (DXA). The test can measure bone density at your hip and your spine. It is similar to having a regular X-ray. You may have this test to:  Diagnose a condition that causes weak or thin bones (osteoporosis).  Predict your risk of a broken bone (fracture).  Determine how well osteoporosis treatment is working. Tell a health care provider about:  Any allergies you have.  All medicines you are taking, including vitamins, herbs, eye drops, creams, and over-the-counter medicines.  Any problems you or family members have had with anesthetic medicines.  Any blood disorders you have.  Any surgeries you have had.  Any medical conditions you have.  Possibility of pregnancy.  Any other medical test you had within the previous 14 days that used contrast material. What are the risks? Generally, this is a safe procedure. However, problems can occur and may include the following:  This test exposes you to a very small amount of radiation.  The risks of radiation exposure may be greater to unborn children. What happens before the procedure?  Do not take any calcium supplements for 24 hours before having the test. You can otherwise eat and drink what you usually do.  Take off all metal jewelry, eyeglasses, dental appliances, and any other metal objects. What happens during the procedure?  You may lie on an exam table. There will be an X-ray generator below you and an imaging device above you.  Other devices, such as boxes or braces, may be used to position your body properly for the scan.  You will need to lie still while the machine slowly scans your body.  The images will show up on a computer monitor. What happens after the  procedure? You may need more testing at a later time. This information is not intended to replace advice given to you by your health care provider. Make sure you discuss any questions you have with your health care provider. Document Released: 04/07/2004 Document Revised: 08/22/2015 Document Reviewed: 08/24/2013  2017 Elsevier  

## 2016-04-28 ENCOUNTER — Other Ambulatory Visit: Payer: Self-pay | Admitting: Gynecology

## 2016-04-28 ENCOUNTER — Ambulatory Visit (INDEPENDENT_AMBULATORY_CARE_PROVIDER_SITE_OTHER): Payer: Medicare Other

## 2016-04-28 DIAGNOSIS — Z780 Asymptomatic menopausal state: Secondary | ICD-10-CM

## 2016-04-30 ENCOUNTER — Other Ambulatory Visit: Payer: Self-pay | Admitting: Nurse Practitioner

## 2016-04-30 DIAGNOSIS — K7400 Hepatic fibrosis, unspecified: Secondary | ICD-10-CM

## 2016-05-21 ENCOUNTER — Ambulatory Visit (INDEPENDENT_AMBULATORY_CARE_PROVIDER_SITE_OTHER): Payer: Medicare Other

## 2016-05-21 ENCOUNTER — Encounter: Payer: Self-pay | Admitting: Podiatry

## 2016-05-21 ENCOUNTER — Ambulatory Visit (INDEPENDENT_AMBULATORY_CARE_PROVIDER_SITE_OTHER): Payer: Medicare Other | Admitting: Podiatry

## 2016-05-21 VITALS — BP 131/67 | HR 84 | Resp 18

## 2016-05-21 DIAGNOSIS — M79609 Pain in unspecified limb: Secondary | ICD-10-CM

## 2016-05-21 DIAGNOSIS — M2041 Other hammer toe(s) (acquired), right foot: Secondary | ICD-10-CM

## 2016-05-21 DIAGNOSIS — B351 Tinea unguium: Secondary | ICD-10-CM | POA: Diagnosis not present

## 2016-05-21 NOTE — Progress Notes (Signed)
   Subjective:    Patient ID: Jaime Boone, female    DOB: [birth year], 76 y.o.   MRN: [id number]  HPI: She presents today with a chief complaint of painful nails secondary to hammertoe deformities.    Review of Systems  Constitutional: Positive for fatigue and unexpected weight change.  HENT: Positive for sinus pressure and sneezing.   Eyes: Positive for itching and visual disturbance.  Respiratory: Positive for cough, shortness of breath and wheezing.   Gastrointestinal:       Bloating  Musculoskeletal: Positive for back pain.  Skin: Positive for color change.       Change in nails  Allergic/Immunologic: Positive for food allergies.  Neurological: Positive for weakness.  All other systems reviewed and are negative.      Objective:   Physical Exam: Vital signs are stable alert and oriented 3. Pulses are strongly palpable. Neurologic sensorium is intact. Deep tendon reflexes are intact. Muscle strength is normal. 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 Toenails are thick yellow dystrophic onychomycotic nail dystrophies particular second digit of the right foot.        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 Pain elicited onychomycosis.  Plan: Debridement of toenails 1 through 5 bilateral.

## 2016-06-03 ENCOUNTER — Ambulatory Visit
Admission: RE | Admit: 2016-06-03 | Discharge: 2016-06-03 | Disposition: A | Discharge status: Home / Self Care | Payer: Medicare Other | Source: Ambulatory Visit | Attending: Nurse Practitioner | Admitting: Nurse Practitioner

## 2016-06-03 DIAGNOSIS — K7400 Hepatic fibrosis, unspecified: Secondary | ICD-10-CM

## 2016-06-04 ENCOUNTER — Ambulatory Visit: Payer: Medicare Other | Attending: Internal Medicine

## 2016-06-04 DIAGNOSIS — M6281 Muscle weakness (generalized): Secondary | ICD-10-CM | POA: Diagnosis present

## 2016-06-04 DIAGNOSIS — R262 Difficulty in walking, not elsewhere classified: Secondary | ICD-10-CM | POA: Diagnosis present

## 2016-06-04 DIAGNOSIS — M545 Low back pain, unspecified: Secondary | ICD-10-CM

## 2016-06-04 DIAGNOSIS — M62830 Muscle spasm of back: Secondary | ICD-10-CM | POA: Diagnosis present

## 2016-06-04 DIAGNOSIS — M2560 Stiffness of unspecified joint, not elsewhere classified: Secondary | ICD-10-CM | POA: Insufficient documentation

## 2016-06-04 DIAGNOSIS — M25561 Pain in right knee: Secondary | ICD-10-CM | POA: Insufficient documentation

## 2016-06-04 DIAGNOSIS — G8929 Other chronic pain: Secondary | ICD-10-CM

## 2016-06-04 DIAGNOSIS — M25562 Pain in left knee: Secondary | ICD-10-CM | POA: Diagnosis present

## 2016-06-04 DIAGNOSIS — R2681 Unsteadiness on feet: Secondary | ICD-10-CM

## 2016-06-04 DIAGNOSIS — M238X2 Other internal derangements of left knee: Secondary | ICD-10-CM | POA: Diagnosis present

## 2016-06-04 DIAGNOSIS — R29898 Other symptoms and signs involving the musculoskeletal system: Secondary | ICD-10-CM | POA: Diagnosis present

## 2016-06-04 NOTE — Therapy (Signed)
[place], [place], [address] Phone: [phone]   Fax:  [phone]  Physical Therapy Evaluation  Patient Details  Name: Jaime Boone MRN: [id number] Date of Birth: Jun 08, 1940 Referring Provider: R. Charlies Constable, MD  Encounter Date: 06/04/2016      PT End of Session - 06/04/16 0911    Visit Number 1   Number of Visits 12   Date for PT Re-Evaluation 07/17/16   Authorization Type UHC Medicare   Authorization Time Period Kx at visit 15   PT Start Time 0920   PT Stop Time 1000   PT Time Calculation (min) 40 min   Activity Tolerance Patient tolerated treatment well;Patient limited by pain   Behavior During Therapy [facility] for tasks assessed/performed      Past Medical History:  Diagnosis Date  . Acid reflux   . Arthritis   . Asthma   . Cirrhosis ([place])   . 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 Degenerative joint disease   . Diabetes mellitus without complication ([place])   . Hepatitis C   . Hypertension     Past Surgical History:  Procedure Laterality Date  . ABDOMINAL HYSTERECTOMY     TAH  . APPENDECTOMY    . DILATION AND CURETTAGE OF UTERUS    . EYE SURGERY     CATARACT  . FOOT SURGERY     BUNIONECTOMY  . GALLBLADDER SURGERY    . NOSE SURGERY      There were no vitals filed for this visit.       Subjective Assessment - 06/04/16 0921    Subjective She reports knee pain and buckling and has had falls. Once on steps. Knees give out and are painful RT> LT . Does exercise class and squatting and running in place hard . She reports she is really here for knees and walking.   Pertinent History Chronic back and hip and knee pain   Limitations Standing;Walking;House hold activities  rolling in bed.    How long can you sit comfortably? not sure   How long can you stand comfortably? 10 min   How long can you walk comfortably? < 30 min   Diagnostic tests none   Patient  Stated Goals She wants to improve to decrease pain.    Currently in Pain? Yes   Pain Score 8    Pain Location Knee   Pain Orientation Right  Lt 6/10   Pain Descriptors / Indicators --  excruciating   Pain Type Chronic pain   Pain Onset More than a month ago   Pain Frequency Intermittent  but has pain every day   Aggravating Factors  weight bearing, bending, weight bearing   Pain Relieving Factors warm water, heat,    Multiple Pain Sites Yes            [facility] PT Assessment - 06/04/16 0001      Assessment   Medical Diagnosis chronic knee and back pain   Referring Provider R. Charlies Constable, MD   Onset Date/Surgical Date --  knee pain 2013   Next MD Visit next month   Prior Therapy yes 10/2015 for back     Precautions   Precautions Fall     Restrictions   Weight Bearing Restrictions No     Balance Screen   Has the patient fallen in the past 6 months Yes   How many times? 3   Has the patient had a  decrease in activity level because of a fear of falling?  Yes     [place] Private residence   Living Arrangements Alone   Type of [place] Elevator;Level entry   [place] One level     Prior Function   Level of Independence Independent  does less at home   Vocation Retired     [employer]   Overall Cognitive Status Within Functional Limits for tasks assessed     ROM / Strength   AROM / PROM / Strength AROM;Strength     AROM   AROM Assessment Site Lumbar;Knee   Right/Left Knee Right;Left   Right Knee Extension -5   Right Knee Flexion 120   Left Knee Extension 0   Left Knee Flexion 125     Strength   Strength Assessment Site Knee;Hip   Right/Left Hip Right;Left   Right Hip Flexion 3+/5   Right Hip Extension 3/5   Right Hip ABduction 3-/5   Left Hip Flexion 4/5   Left Hip Extension 3/5   Left Hip ABduction 3+/5   Right/Left Knee Right;Left   Right Knee Flexion 4/5  painful   Right Knee Extension 4/5  painful    Left Knee Flexion 4/5  painful   Left Knee Extension 4-/5  painful     Palpation   Palpation comment tender RT knee to touch     Bed Mobility   Bed Mobility --  difficulty turning in bed due to pain and weakness     Ambulation/Gait   Gait Comments Normal     Standardized Balance Assessment   Standardized Balance Assessment Berg Balance Test     Berg Balance Test   Sit to Stand Able to stand  independently using hands   Standing Unsupported Able to stand safely 2 minutes   Sitting with Back Unsupported but Feet Supported on Floor or Stool Able to sit safely and securely 2 minutes   Stand to Sit Controls descent by using hands   Transfers Able to transfer safely, definite need of hands   Standing Unsupported with Eyes Closed Able to stand 10 seconds safely   Standing Ubsupported with Feet Together Needs help to attain position and unable to hold for 15 seconds   From Standing, Reach Forward with Outstretched Arm Can reach forward >12 cm safely (5")   From Standing Position, Pick up Object from Floor Able to pick up shoe, needs supervision   From Standing Position, Turn to Look Behind Over each Shoulder Looks behind from both sides and weight shifts well   Turn 360 Degrees Able to turn 360 degrees safely one side only in 4 seconds or less   Standing Unsupported, Alternately Place Feet on Step/Stool Able to complete >2 steps/needs minimal assist   Standing Unsupported, One Foot in Front Able to take small step independently and hold 30 seconds   Standing on One Leg Tries to lift leg/unable to hold 3 seconds but remains standing independently   Total Score 38                           PT Education - 06/04/16 1013    Education provided Yes   Education Details POC , results of eval. beneifits of useof device due to fall risk.    Person(s) Educated Patient   Methods Explanation   Comprehension Verbalized understanding  PT Short Term Goals - 06/04/16  0914      PT SHORT TERM GOAL #1   Title he will be independent with inital HEP   Time 3   Period Weeks   Status New     PT SHORT TERM GOAL #2   Title pt will increase her FOTO score by > 5 points to demonstrate increased functional capacity (10/30/2014)   Time 3   Period Weeks   Status New     PT SHORT TERM GOAL #3   Title she will be able to verbalize and demonstrate techniques to reduce hip/low back reinjury via postural awareness, lifting and carrying mechanics and HEP (10/30/2014)   Time 3   Status New     PT SHORT TERM GOAL #4   Title She will report pain decreased 30%    Time 3   Period Weeks   Status New           PT Long Term Goals - 06/04/16 0915      PT LONG TERM GOAL #1   Title upon discharge pt will be I with all HEP given throughout therapy    Time 6   Period Weeks   Status New     PT LONG TERM GOAL #2   Title pt will increase bil LE strenght to >4/5 to assist walking endurance and safety    Time 6   Period Weeks   Status New     PT LONG TERM GOAL #3   Title she will demontrate < 4/10 pain during and following prolonged walking/standing for > 30 min, and ascending/descend > 12 steps to help with community amb   Time 6   Period Weeks   Status New     PT LONG TERM GOAL #4   Title she will report pain in knee decreased 50$ with walking in home   Time 6   Period Weeks   Status New     PT LONG TERM GOAL #5   Title  pt will demonstate FOTO score of <60% limited to demonstrate improved functional capacity upon discharge    Time 6   Period Weeks   Status New               Plan - 06/04/16 0912    Clinical Impression Statement Ms Taketa presetns for moderate complexity eval for chronic back and knee pain with difficulty walking and decreased stability, BERG score of 38 indicated need for walker for safety but I encouraged her to aquire a cane. She is most interested in treatin her knees so will not address chronic back pain . She should  probably see ortho MD for assessment and possible injections if appropriate   Rehab Potential Good   PT Frequency 2x / week   PT Duration 6 weeks   PT Treatment/Interventions Cryotherapy;Iontophoresis 4mg /ml Dexamethasone;Moist Heat;Therapeutic exercise;Gait training;Passive range of motion;Patient/family education;Taping;Manual techniques;Dry needling   PT Next Visit Plan LE  strength, modalities for pain, possible taping   Consulted and Agree with Plan of Care Patient      Patient will benefit from skilled therapeutic intervention in order to improve the following deficits and impairments:  Pain, Difficulty walking, Decreased range of motion, Decreased strength, Decreased activity tolerance  Visit Diagnosis: Chronic midline low back pain without sciatica  Muscle weakness (generalized)  Chronic pain of left knee  Crepitus of joint of left knee  Difficulty in walking, not elsewhere classified  Chronic pain of right knee  Unsteadiness  on feet      G-Codes - 06/04/16 1009    Functional Assessment Tool Used (Outpatient Only) FOTO  74% limited   Functional Limitation Mobility: Walking and moving around   Mobility: Walking and Moving Around Current Status [phone]) At least 60 percent but less than 80 percent impaired, limited or restricted   Mobility: Walking and Moving Around Goal Status [phone]) At least 40 percent but less than 60 percent impaired, limited or restricted       Problem List Patient Active Problem List   Diagnosis Date Noted  . Menopause 03/16/2016  . Keratosis 04/17/2015  . Vaginal lesion 03/14/2015  . Sebaceous cyst of labia 04/06/2012  . History of hepatitis C 04/06/2012  . Constipation 04/06/2012  . Postmenopausal 03/01/2012  . Vaginal atrophy 03/01/2012    Darrel Hoover   PT 06/04/2016, 10:17 AM  [facility] [address] [place], [place], [address] Phone: [phone]   Fax:   [phone]  Name: Jaime Boone MRN: [id number] Date of Birth: 14-Oct-1940

## 2016-06-09 ENCOUNTER — Ambulatory Visit: Payer: Medicare Other

## 2016-06-11 ENCOUNTER — Encounter: Payer: Self-pay | Admitting: Physical Therapy

## 2016-06-11 ENCOUNTER — Ambulatory Visit: Payer: Medicare Other | Admitting: Physical Therapy

## 2016-06-11 DIAGNOSIS — G8929 Other chronic pain: Secondary | ICD-10-CM

## 2016-06-11 DIAGNOSIS — R29898 Other symptoms and signs involving the musculoskeletal system: Secondary | ICD-10-CM

## 2016-06-11 DIAGNOSIS — M238X2 Other internal derangements of left knee: Secondary | ICD-10-CM

## 2016-06-11 DIAGNOSIS — M6281 Muscle weakness (generalized): Secondary | ICD-10-CM

## 2016-06-11 DIAGNOSIS — M25561 Pain in right knee: Secondary | ICD-10-CM

## 2016-06-11 DIAGNOSIS — M545 Low back pain, unspecified: Secondary | ICD-10-CM

## 2016-06-11 DIAGNOSIS — R2681 Unsteadiness on feet: Secondary | ICD-10-CM

## 2016-06-11 DIAGNOSIS — M25562 Pain in left knee: Secondary | ICD-10-CM

## 2016-06-11 DIAGNOSIS — R262 Difficulty in walking, not elsewhere classified: Secondary | ICD-10-CM

## 2016-06-11 NOTE — Therapy (Signed)
[place], [place], [address] Phone: [phone]   Fax:  [phone]  Physical Therapy Treatment  Patient Details  Name: Jaime Boone MRN: [id number] Date of Birth: [birth year] Referring Provider: R. Charlies Constable, MD  Encounter Date: 06/11/2016      PT End of Session - 06/11/16 1746    Visit Number 2   Number of Visits 12   Date for PT Re-Evaluation 07/17/16   PT Start Time 0848   PT Stop Time 0930   PT Time Calculation (min) 42 min   Activity Tolerance Patient tolerated treatment well   Behavior During Therapy [facility] for tasks assessed/performed      Past Medical History:  Diagnosis Date  . Acid reflux   . Arthritis   . Asthma   . Cirrhosis ([place])   . 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 Degenerative joint disease   . Diabetes mellitus without complication ([place])   . Hepatitis C   . Hypertension     Past Surgical History:  Procedure Laterality Date  . ABDOMINAL HYSTERECTOMY     TAH  . APPENDECTOMY    . DILATION AND CURETTAGE OF UTERUS    . EYE SURGERY     CATARACT  . FOOT SURGERY     BUNIONECTOMY  . GALLBLADDER SURGERY    . NOSE SURGERY      There were no vitals filed for this visit.      Subjective Assessment - 06/11/16 0903    Subjective Wants to exercise today.  No falls since last visit.   Currently in Pain? Yes   Pain Score 8    Pain Location Knee   Pain Orientation Right   Pain Descriptors / Indicators Aching;Shooting;Spasm;Cramping;Burning   Pain Type Chronic pain   Pain Radiating Towards bunion   Pain Frequency Intermittent   Aggravating Factors  sleeping with legs together ,  bending,  walking   Pain Relieving Factors warm water heat,  using pillows                         [facility] Adult PT Treatment/Exercise - 06/11/16 0001      Lumbar Exercises: Supine   Ab Set 5 reps;5 seconds  2 sets,  cues   Clam 10 reps   Clam  Limitations HEP   Heel Slides Limitations painful   Bent Knee Raise 10 reps   Bent Knee Raise Limitations HEP   Dead Bug 5 reps   Dead Bug Limitations HEP     Knee/Hip Exercises: Aerobic   Nustep 8 minutes arms/ legs     Knee/Hip Exercises: Standing   Heel Raises 10 reps   Hip Abduction 10 reps   Hip Extension 10 reps   Forward Step Up Both;1 set;Hand Hold: 2;Step Height: 4"   Forward Step Up Limitations close SBA,  CGA  cues fore saafety   Functional Squat 5 reps   Functional Squat Limitations cues foir equal weight shifts     Knee/Hip Exercises: Supine   Bridges 5 reps   Other Supine Knee/Hip Exercises clams with red band  HEP                PT Education - 06/11/16 1745    Education provided Yes   Education Details HEP   Person(s) Educated Patient   Methods Explanation;Demonstration;Verbal cues;Handout   Comprehension Verbalized understanding;Returned demonstration  PT Short Term Goals - 06/04/16 0914      PT SHORT TERM GOAL #1   Title he will be independent with inital HEP   Time 3   Period Weeks   Status New     PT SHORT TERM GOAL #2   Title pt will increase her FOTO score by > 5 points to demonstrate increased functional capacity (10/30/2014)   Time 3   Period Weeks   Status New     PT SHORT TERM GOAL #3   Title she will be able to verbalize and demonstrate techniques to reduce hip/low back reinjury via postural awareness, lifting and carrying mechanics and HEP (10/30/2014)   Time 3   Status New     PT SHORT TERM GOAL #4   Title She will report pain decreased 30%    Time 3   Period Weeks   Status New           PT Long Term Goals - 06/04/16 0915      PT LONG TERM GOAL #1   Title upon discharge pt will be I with all HEP given throughout therapy    Time 6   Period Weeks   Status New     PT LONG TERM GOAL #2   Title pt will increase bil LE strenght to >4/5 to assist walking endurance and safety    Time 6   Period Weeks    Status New     PT LONG TERM GOAL #3   Title she will demontrate < 4/10 pain during and following prolonged walking/standing for > 30 min, and ascending/descend > 12 steps to help with community amb   Time 6   Period Weeks   Status New     PT LONG TERM GOAL #4   Title she will report pain in knee decreased 50$ with walking in home   Time 6   Period Weeks   Status New     PT LONG TERM GOAL #5   Title  pt will demonstate FOTO score of <60% limited to demonstrate improved functional capacity upon discharge    Time 6   Period Weeks   Status New               Plan - 06/11/16 1746    Clinical Impression Statement Progress toward HEP today .  She has found and is using her cane in community.  Strengthening focus.  intermitant soreness with some exercoises.    PT Next Visit Plan LE  strength, modalities for pain, possible taping   PT Home Exercise Plan clams with bands,  abdominal bracing with July 16, 2022 hips/ knees,  dead bug,    Consulted and Agree with Plan of Care Patient      Patient will benefit from skilled therapeutic intervention in order to improve the following deficits and impairments:  Pain, Difficulty walking, Decreased range of motion, Decreased strength, Decreased activity tolerance  Visit Diagnosis: Chronic midline low back pain without sciatica  Muscle weakness (generalized)  Chronic pain of left knee  Crepitus of joint of left knee  Difficulty in walking, not elsewhere classified  Chronic pain of right knee  Unsteadiness on feet  Weakness of both legs     Problem List Patient Active Problem List   Diagnosis Date Noted  . Menopause 03/16/2016  . Keratosis 04/17/2015  . Vaginal lesion 03/14/2015  . Sebaceous cyst of labia 04/06/2012  . History of hepatitis C 04/06/2012  . Constipation 04/06/2012  .  Postmenopausal 03/01/2012  . Vaginal atrophy 03/01/2012    Jaime Boone PTA 06/11/2016, 5:48 PM  [facility] [address] [place], [place], [address] Phone: [phone]   Fax:  [phone]  Name: Jaime Boone MRN: [id number] Date of Birth: 1940/07/06

## 2016-06-11 NOTE — Patient Instructions (Signed)
Lumbar and knee exercises issued from exercise drawer Lumbar stabilzation with red band for clams.  10 x each 3-4 x a week

## 2016-06-16 ENCOUNTER — Ambulatory Visit: Payer: Medicare Other

## 2016-06-16 DIAGNOSIS — M545 Low back pain: Principal | ICD-10-CM

## 2016-06-16 DIAGNOSIS — M238X2 Other internal derangements of left knee: Secondary | ICD-10-CM

## 2016-06-16 DIAGNOSIS — M6281 Muscle weakness (generalized): Secondary | ICD-10-CM

## 2016-06-16 DIAGNOSIS — R262 Difficulty in walking, not elsewhere classified: Secondary | ICD-10-CM

## 2016-06-16 DIAGNOSIS — M25561 Pain in right knee: Secondary | ICD-10-CM

## 2016-06-16 DIAGNOSIS — R2681 Unsteadiness on feet: Secondary | ICD-10-CM

## 2016-06-16 DIAGNOSIS — G8929 Other chronic pain: Secondary | ICD-10-CM

## 2016-06-16 DIAGNOSIS — M25562 Pain in left knee: Secondary | ICD-10-CM

## 2016-06-16 NOTE — Therapy (Signed)
Jaime Boone, [place], [address] Phone: [phone]   Fax:  [phone]  Physical Therapy Treatment  Patient Details  Name: Jaime Boone MRN: [id number] Date of Birth: 1940/11/15 Referring Provider: R. Charlies Constable, MD  Encounter Date: 06/16/2016      PT End of Session - 06/16/16 0930    Visit Number 3   Number of Visits 12   Date for PT Re-Evaluation 07/17/16   Authorization Type UHC Medicare   Authorization Time Period Kx at visit 15   PT Start Time 0930   PT Stop Time 1015   PT Time Calculation (min) 45 min   Activity Tolerance Patient tolerated treatment well   Behavior During Therapy WFL for tasks assessed/performed      Past Medical History:  Diagnosis Date  . Acid reflux   . Arthritis   . Asthma   . Cirrhosis ([place])   . 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 Degenerative joint disease   . Diabetes mellitus without complication ([place])   . Hepatitis C   . Hypertension     Past Surgical History:  Procedure Laterality Date  . ABDOMINAL HYSTERECTOMY     TAH  . APPENDECTOMY    . DILATION AND CURETTAGE OF UTERUS    . EYE SURGERY     CATARACT  . FOOT SURGERY     BUNIONECTOMY  . GALLBLADDER SURGERY    . NOSE SURGERY      There were no vitals filed for this visit.      Subjective Assessment - 06/16/16 0940    Subjective No pain , good day today. She reported bilateral knee pain 3/17, [place] of legs 3/15/and 3/18, [place] pain nad weakness RT thigh and leg 3/18. Unable to coorolate activity or postion.. Was mopstly resting though did walk down [place] once and on riding elevator pain started in Rt leg    Currently in Pain? No/denies                         [facility] Adult PT Treatment/Exercise - 06/16/16 0001      Lumbar Exercises: Supine   Ab Set 10 reps;5 seconds   Clam 15 reps   Bent Knee Raise 15 reps   Bent Knee Raise Limitations cues   Dead  Bug 10 reps   Dead Bug Limitations cued to engage abdominals     Knee/Hip Exercises: Aerobic   Nustep 6 minutes arms/ legs L6     Knee/Hip Exercises: Seated   Long Arc Quad Right;Left;15 reps   Long Arc Quad Weight 3 lbs.   Long [employer] Limitations 5 sec hold   Sit to [employer] 5 reps;with UE support  with pole in front both hands and light PT assist     Knee/Hip Exercises: Supine   Bridges 15 reps   Other Supine Knee/Hip Exercises clams with green band x15, then bent leg lift x 12 RT/LT                  PT Short Term Goals - 06/16/16 1018      PT SHORT TERM GOAL #1   Title he will be independent with inital HEP   Status On-going     PT SHORT TERM GOAL #2   Title pt will increase her FOTO score by > 5 points to demonstrate increased functional capacity (10/30/2014)   Status Unable to assess  PT SHORT TERM GOAL #3   Title she will be able to verbalize and demonstrate techniques to reduce hip/low back reinjury via postural awareness, lifting and carrying mechanics and HEP (10/30/2014)   Status On-going     PT SHORT TERM GOAL #4   Title She will report pain decreased 30%    Baseline variable   Status On-going           PT Long Term Goals - 06/04/16 0915      PT LONG TERM GOAL #1   Title upon discharge pt will be I with all HEP given throughout therapy    Time 6   Period Weeks   Status New     PT LONG TERM GOAL #2   Title pt will increase bil LE strenght to >4/5 to assist walking endurance and safety    Time 6   Period Weeks   Status New     PT LONG TERM GOAL #3   Title she will demontrate < 4/10 pain during and following prolonged walking/standing for > 30 min, and ascending/descend > 12 steps to help with community amb   Time 6   Period Weeks   Status New     PT LONG TERM GOAL #4   Title she will report pain in knee decreased 50$ with walking in home   Time 6   Period Weeks   Status New     PT LONG TERM GOAL #5   Title  pt will demonstate FOTO  score of <60% limited to demonstrate improved functional capacity upon discharge    Time 6   Period Weeks   Status New               Plan - 06/16/16 1017    Clinical Impression Statement Pain better today but still very weak nad poor core control with exercise but she worked hard to do exercise correctly.  no pain post.    PT Treatment/Interventions Cryotherapy;Iontophoresis 4mg /ml Dexamethasone;Moist Heat;Therapeutic exercise;Gait training;Passive range of motion;Patient/family education;Taping;Manual techniques;Dry needling   PT Next Visit Plan LE  strength, modalities for pain, possible taping   PT Home Exercise Plan clams with bands,  abdominal bracing with 07/12/2022 hips/ knees,  dead bug,    Consulted and Agree with Plan of Care Patient      Patient will benefit from skilled therapeutic intervention in order to improve the following deficits and impairments:  Pain, Difficulty walking, Decreased range of motion, Decreased strength, Decreased activity tolerance  Visit Diagnosis: Chronic midline low back pain without sciatica  Muscle weakness (generalized)  Chronic pain of left knee  Crepitus of joint of left knee  Difficulty in walking, not elsewhere classified  Chronic pain of right knee  Unsteadiness on feet     Problem List Patient Active Problem List   Diagnosis Date Noted  . Menopause 03/16/2016  . Keratosis 04/17/2015  . Vaginal lesion 03/14/2015  . Sebaceous cyst of labia 04/06/2012  . History of hepatitis C 04/06/2012  . Constipation 04/06/2012  . Postmenopausal 03/01/2012  . Vaginal atrophy 03/01/2012    Darrel Hoover  PT 06/16/2016, 10:19 AM  [facility] [address] [place], [place], [address] Phone: [phone]   Fax:  [phone]  Name: Jaime Boone MRN: [id number] Date of Birth: 01/04/1941

## 2016-06-18 ENCOUNTER — Ambulatory Visit: Payer: Medicare Other | Admitting: Physical Therapy

## 2016-06-18 DIAGNOSIS — R262 Difficulty in walking, not elsewhere classified: Secondary | ICD-10-CM

## 2016-06-18 DIAGNOSIS — M25562 Pain in left knee: Secondary | ICD-10-CM

## 2016-06-18 DIAGNOSIS — M545 Low back pain, unspecified: Secondary | ICD-10-CM

## 2016-06-18 DIAGNOSIS — R2681 Unsteadiness on feet: Secondary | ICD-10-CM

## 2016-06-18 DIAGNOSIS — M238X2 Other internal derangements of left knee: Secondary | ICD-10-CM

## 2016-06-18 DIAGNOSIS — M25561 Pain in right knee: Secondary | ICD-10-CM

## 2016-06-18 DIAGNOSIS — G8929 Other chronic pain: Secondary | ICD-10-CM

## 2016-06-18 DIAGNOSIS — M6281 Muscle weakness (generalized): Secondary | ICD-10-CM

## 2016-06-18 NOTE — Therapy (Signed)
[place], [place], [address] Phone: [phone]   Fax:  [phone]  Physical Therapy Treatment  Patient Details  Name: Jaime Boone MRN: [id number] Date of Birth: 04/30/1940 Referring Provider: R. Charlies Constable, MD  Encounter Date: 06/18/2016      PT End of Session - 06/18/16 1138    Visit Number 4   Number of Visits 12   Date for PT Re-Evaluation 07/17/16   Authorization Type U[employer] Medicare   Authorization Time Period Kx at visit 15   PT Start Time 1103   PT Stop Time 1141   PT Time Calculation (min) 38 min      Past Medical History:  Diagnosis Date  . Acid reflux   . Arthritis   . Asthma   . Cirrhosis ([place])   . 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 Degenerative joint disease   . Diabetes mellitus without complication ([place])   . Hepatitis C   . Hypertension     Past Surgical History:  Procedure Laterality Date  . ABDOMINAL HYSTERECTOMY     TAH  . APPENDECTOMY    . DILATION AND CURETTAGE OF UTERUS    . EYE SURGERY     CATARACT  . FOOT SURGERY     BUNIONECTOMY  . GALLBLADDER SURGERY    . NOSE SURGERY      There were no vitals filed for this visit.      Subjective Assessment - 06/18/16 1108    Subjective I am like an old lady trying to get out of the car. I am slow.    Currently in Pain? Yes   Pain Score 3    Pain Location Knee   Pain Orientation Right   Aggravating Factors  transitions, getting out of car    Pain Relieving Factors warm water heat, using pillows                          [facility] Adult PT Treatment/Exercise - 06/18/16 0001      Lumbar Exercises: Supine   Bent Knee Raise 20 reps   Bent Knee Raise Limitations cues     Knee/Hip Exercises: Aerobic   Nustep 6 minutes arms/ legs L6     Knee/Hip Exercises: Seated   Long Arc Quad 10 reps   Long Arc Quad Limitations 5 sec hold   Sit to [employer] 10 reps  seat elevated with  foam pad     Knee/Hip Exercises: Supine   Short Arc [employer] Both;20 reps   Bridges 15 reps   Straight Leg Raises Right;10 reps   Straight Leg Raises Limitations with ab draw   Other Supine Knee/Hip Exercises clams with green band x15, then bent leg lift x 12 RT/LT alternating with core brace                   PT Short Term Goals - 06/18/16 1132      PT SHORT TERM GOAL #1   Title he will be independent with inital HEP   Baseline trying to do some    Time 3   Period Weeks   Status On-going     PT SHORT TERM GOAL #2   Title pt will increase her FOTO score by > 5 points to demonstrate increased functional capacity (10/30/2014)   Baseline 74% limited on Eval    Time 3   Period Weeks   Status  Unable to assess     PT SHORT TERM GOAL #3   Title she will be able to verbalize and demonstrate techniques to reduce hip/low back reinjury via postural awareness, lifting and carrying mechanics and HEP (10/30/2014)   Time 3   Period Weeks   Status On-going     PT SHORT TERM GOAL #4   Title She will report pain decreased 30%    Baseline thinks she's a little better   Time 3   Period Weeks   Status On-going           PT Long Term Goals - 06/04/16 0915      PT LONG TERM GOAL #1   Title upon discharge pt will be I with all HEP given throughout therapy    Time 6   Period Weeks   Status New     PT LONG TERM GOAL #2   Title pt will increase bil LE strenght to >4/5 to assist walking endurance and safety    Time 6   Period Weeks   Status New     PT LONG TERM GOAL #3   Title she will demontrate < 4/10 pain during and following prolonged walking/standing for > 30 min, and ascending/descend > 12 steps to help with community amb   Time 6   Period Weeks   Status New     PT LONG TERM GOAL #4   Title she will report pain in knee decreased 50$ with walking in home   Time 6   Period Weeks   Status New     PT LONG TERM GOAL #5   Title  pt will demonstate FOTO score of <60%  limited to demonstrate improved functional capacity upon discharge    Time 6   Period Weeks   Status New               Plan - 06/18/16 1135    Clinical Impression Statement Pt reports increased fatigue today due to having an appt prior to this appointment today. She did well with exercises and reports she is doing some of them at home. She reports popping at knee cap with LAQ. Small progress toward STGS.    PT Next Visit Plan LE  strength, modalities for pain, possible taping   PT Home Exercise Plan clams with bands,  abdominal bracing with July 02, 2022 hips/ knees,  dead bug,    Consulted and Agree with Plan of Care Patient      Patient will benefit from skilled therapeutic intervention in order to improve the following deficits and impairments:  Pain, Difficulty walking, Decreased range of motion, Decreased strength, Decreased activity tolerance  Visit Diagnosis: Chronic midline low back pain without sciatica  Muscle weakness (generalized)  Chronic pain of left knee  Crepitus of joint of left knee  Difficulty in walking, not elsewhere classified  Chronic pain of right knee  Unsteadiness on feet     Problem List Patient Active Problem List   Diagnosis Date Noted  . Menopause 03/16/2016  . Keratosis 04/17/2015  . Vaginal lesion 03/14/2015  . Sebaceous cyst of labia 04/06/2012  . History of hepatitis C 04/06/2012  . Constipation 04/06/2012  . Postmenopausal 03/01/2012  . Vaginal atrophy 03/01/2012    Dorene Ar, PTA 06/18/2016, 11:44 AM  [place], [place], [address] Phone: [phone]   Fax:  [phone]  Name: Omega Durante MRN: [id number] Date of Birth: 1940-05-05

## 2016-06-23 ENCOUNTER — Ambulatory Visit: Payer: Medicare Other

## 2016-06-23 DIAGNOSIS — M6281 Muscle weakness (generalized): Secondary | ICD-10-CM

## 2016-06-23 DIAGNOSIS — M545 Low back pain, unspecified: Secondary | ICD-10-CM

## 2016-06-23 DIAGNOSIS — M5386 Other specified dorsopathies, lumbar region: Secondary | ICD-10-CM

## 2016-06-23 DIAGNOSIS — M238X2 Other internal derangements of left knee: Secondary | ICD-10-CM

## 2016-06-23 DIAGNOSIS — G8929 Other chronic pain: Secondary | ICD-10-CM

## 2016-06-23 DIAGNOSIS — M25562 Pain in left knee: Secondary | ICD-10-CM

## 2016-06-23 DIAGNOSIS — M62830 Muscle spasm of back: Secondary | ICD-10-CM

## 2016-06-23 NOTE — Therapy (Signed)
[name], [place], [address] Phone: [phone]   Fax:  [phone]  Physical Therapy Treatment  Patient Details  Name: Jaime Boone MRN: [id number] Date of Birth: 1940/06/14 Referring Provider: R. Charlies Constable, MD  Encounter Date: 06/23/2016      PT End of Session - 06/23/16 0937    Visit Number 5   Number of Visits 12   Date for PT Re-Evaluation 07/17/16   Authorization Type UHC Medicare   Authorization Time Period Kx at visit 15   PT Start Time 0930   PT Stop Time 1002   PT Time Calculation (min) 32 min   Activity Tolerance Patient tolerated treatment well   Behavior During Therapy WFL for tasks assessed/performed      Past Medical History:  Diagnosis Date  . Acid reflux   . Arthritis   . Asthma   . Cirrhosis ([place])   . 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 Degenerative joint disease   . Diabetes mellitus without complication ([place])   . Hepatitis C   . Hypertension     Past Surgical History:  Procedure Laterality Date  . ABDOMINAL HYSTERECTOMY     TAH  . APPENDECTOMY    . DILATION AND CURETTAGE OF UTERUS    . EYE SURGERY     CATARACT  . FOOT SURGERY     BUNIONECTOMY  . GALLBLADDER SURGERY    . NOSE SURGERY      There were no vitals filed for this visit.      Subjective Assessment - 06/23/16 0936    Subjective Think I'm on the mend . No pain today, last pain 2-3 days ago   Currently in Pain? No/denies                         [facility] Adult PT Treatment/Exercise - 06/23/16 0001      Knee/Hip Exercises: Aerobic   Nustep 7 min UE/LE L5     Knee/Hip Exercises: Supine   Bridges 20 reps   Straight Leg Raises Right;Left;2 sets;10 reps   Straight Leg Raises Limitations with ab draw   Knee Flexion Limitations part situps to facilitate abdominal contractions. 2x5 reps   Other Supine Knee/Hip Exercises clams with green band x20, then bent  leg lift x 15 RT/LT alternating with core brace                   PT Short Term Goals - 06/23/16 1006      PT SHORT TERM GOAL #1   Title he will be independent with inital HEP   Status On-going     PT SHORT TERM GOAL #2   Title pt will increase her FOTO score by > 5 points to demonstrate increased functional capacity (10/30/2014)   Status On-going     PT SHORT TERM GOAL #3   Title she will be able to verbalize and demonstrate techniques to reduce hip/low back reinjury via postural awareness, lifting and carrying mechanics and HEP    Status On-going     PT SHORT TERM GOAL #4   Title She will report pain decreased 30%    Status Achieved           PT Long Term Goals - 06/04/16 0915      PT LONG TERM GOAL #1   Title upon discharge pt will be I with all HEP given throughout therapy  Time 6   Period Weeks   Status New     PT LONG TERM GOAL #2   Title pt will increase bil LE strenght to >4/5 to assist walking endurance and safety    Time 6   Period Weeks   Status New     PT LONG TERM GOAL #3   Title she will demontrate < 4/10 pain during and following prolonged walking/standing for > 30 min, and ascending/descend > 12 steps to help with community amb   Time 6   Period Weeks   Status New     PT LONG TERM GOAL #4   Title she will report pain in knee decreased 50$ with walking in home   Time 6   Period Weeks   Status New     PT LONG TERM GOAL #5   Title  pt will demonstate FOTO score of <60% limited to demonstrate improved functional capacity upon discharge    Time 6   Period Weeks   Status New               Plan - 06/23/16 1916    Clinical Impression Statement She did well with exercise but reported fatigue and stated she was done after the  program today though she had no pain. . She is doing well with program and pain seems to be easing off for longer periods   PT Treatment/Interventions Cryotherapy;Iontophoresis 4mg /ml Dexamethasone;Moist  Heat;Therapeutic exercise;Gait training;Passive range of motion;Patient/family education;Taping;Manual techniques;Dry needling   PT Next Visit Plan LE  strength, modalities for pain, possible taping   PT Home Exercise Plan clams with bands,  abdominal bracing with 27-Jul-2022 hips/ knees,  dead bug,    Consulted and Agree with Plan of Care Patient      Patient will benefit from skilled therapeutic intervention in order to improve the following deficits and impairments:  Pain, Difficulty walking, Decreased range of motion, Decreased strength, Decreased activity tolerance  Visit Diagnosis: Chronic midline low back pain without sciatica  Muscle weakness (generalized)  Chronic pain of left knee  Crepitus of joint of left knee  Muscle spasm of back  Decreased ROM of lumbar spine     Problem List Patient Active Problem List   Diagnosis Date Noted  . Menopause 03/16/2016  . Keratosis 04/17/2015  . Vaginal lesion 03/14/2015  . Sebaceous cyst of labia 04/06/2012  . History of hepatitis C 04/06/2012  . Constipation 04/06/2012  . Postmenopausal 03/01/2012  . Vaginal atrophy 03/01/2012    Darrel Hoover  PT 06/23/2016, 10:07 AM  [facility] [address] [place], [place], [address] Phone: [phone]   Fax:  [phone]  Name: Jaime Boone MRN: [id number] Date of Birth: 12/13/1940

## 2016-06-25 ENCOUNTER — Ambulatory Visit: Payer: Medicare Other | Admitting: Physical Therapy

## 2016-06-25 DIAGNOSIS — M6281 Muscle weakness (generalized): Secondary | ICD-10-CM

## 2016-06-25 DIAGNOSIS — M545 Low back pain: Secondary | ICD-10-CM | POA: Diagnosis not present

## 2016-06-25 DIAGNOSIS — G8929 Other chronic pain: Secondary | ICD-10-CM

## 2016-06-25 DIAGNOSIS — M25562 Pain in left knee: Secondary | ICD-10-CM

## 2016-06-25 NOTE — Therapy (Signed)
[place], [place], [address] Phone: [phone]   Fax:  [phone]  Physical Therapy Treatment  Patient Details  Name: Jaime Boone MRN: [id number] Date of Birth: 07/08/1940 Referring Provider: R. Charlies Constable, MD  Encounter Date: 06/25/2016      PT End of Session - 06/25/16 1304    Visit Number 6   Number of Visits 12   Date for PT Re-Evaluation 07/17/16   Authorization Type UHC Medicare   Authorization Time Period Kx at visit 15   PT Start Time 1300   PT Stop Time 1345   PT Time Calculation (min) 45 min      Past Medical History:  Diagnosis Date  . Acid reflux   . Arthritis   . Asthma   . Cirrhosis ([place])   . 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 Degenerative joint disease   . Diabetes mellitus without complication ([place])   . Hepatitis C   . Hypertension     Past Surgical History:  Procedure Laterality Date  . ABDOMINAL HYSTERECTOMY     TAH  . APPENDECTOMY    . DILATION AND CURETTAGE OF UTERUS    . EYE SURGERY     CATARACT  . FOOT SURGERY     BUNIONECTOMY  . GALLBLADDER SURGERY    . NOSE SURGERY      There were no vitals filed for this visit.      Subjective Assessment - 06/25/16 1304    Subjective Mostly just stifness, pain is almost gone. Except pain with stairs, that is still severe.    Currently in Pain? No/denies                         [facility] Adult PT Treatment/Exercise - 06/25/16 0001      Knee/Hip Exercises: Aerobic   Nustep 7 min UE/LE L5     Knee/Hip Exercises: Standing   Heel Raises 10 reps   Knee Flexion 10 reps   Hip Abduction 10 reps   Hip Extension 10 reps   Forward Step Up Both;1 set;Hand Hold: 2;Step Height: 4"   Forward Step Up Limitations close SBA,  CGA  cues fore saafety   Functional Squat 10 reps   Functional Squat Limitations cues foir equal weight shifts   Gait Training stairs step to pattern  ascending and descending, cues for which handrail  to use      Knee/Hip Exercises: Seated   Long Arc Quad 10 reps   Long Arc Quad Weight 4 lbs.   Long [employer] Limitations 5 sec hold     Knee/Hip Exercises: Supine   Short Arc [employer] Both;20 reps   Bridges 20 reps   Straight Leg Raises Right;Left;2 sets;10 reps                  PT Short Term Goals - 06/23/16 1006      PT SHORT TERM GOAL #1   Title he will be independent with inital HEP   Status On-going     PT SHORT TERM GOAL #2   Title pt will increase her FOTO score by > 5 points to demonstrate increased functional capacity (10/30/2014)   Status On-going     PT SHORT TERM GOAL #3   Title she will be able to verbalize and demonstrate techniques to reduce hip/low back reinjury via postural awareness, lifting and carrying mechanics and HEP  Status On-going     PT SHORT TERM GOAL #4   Title She will report pain decreased 30%    Status Achieved           PT Long Term Goals - 06/04/16 0915      PT LONG TERM GOAL #1   Title upon discharge pt will be I with all HEP given throughout therapy    Time 6   Period Weeks   Status New     PT LONG TERM GOAL #2   Title pt will increase bil LE strenght to >4/5 to assist walking endurance and safety    Time 6   Period Weeks   Status New     PT LONG TERM GOAL #3   Title she will demontrate < 4/10 pain during and following prolonged walking/standing for > 30 min, and ascending/descend > 12 steps to help with community amb   Time 6   Period Weeks   Status New     PT LONG TERM GOAL #4   Title she will report pain in knee decreased 50$ with walking in home   Time 6   Period Weeks   Status New     PT LONG TERM GOAL #5   Title  pt will demonstate FOTO score of <60% limited to demonstrate improved functional capacity upon discharge    Time 6   Period Weeks   Status New               Plan - 06/25/16 1305    Clinical Impression Statement Pt reports pain  resolving except with stair negotiation. She did well wtih 4 inch step ups and 1 hand rail. Practiced stair climbing on 4 inch and 6 inch steps with pt demonstrating ability to climb 6 inch stairs with step to pattern, leading with RLE. She has not been able to go up the stairs however she plans to try now. Descending stairs still painful with either LE.    PT Next Visit Plan LE  strength, modalities for pain, possible taping   PT Home Exercise Plan clams with bands,  abdominal bracing with 12-Jul-2022 hips/ knees,  dead bug,    Consulted and Agree with Plan of Care Patient      Patient will benefit from skilled therapeutic intervention in order to improve the following deficits and impairments:  Pain, Difficulty walking, Decreased range of motion, Decreased strength, Decreased activity tolerance  Visit Diagnosis: Chronic midline low back pain without sciatica  Muscle weakness (generalized)  Chronic pain of left knee     Problem List Patient Active Problem List   Diagnosis Date Noted  . Menopause 03/16/2016  . Keratosis 04/17/2015  . Vaginal lesion 03/14/2015  . Sebaceous cyst of labia 04/06/2012  . History of hepatitis C 04/06/2012  . Constipation 04/06/2012  . Postmenopausal 03/01/2012  . Vaginal atrophy 03/01/2012    Dorene Ar, PTA 06/25/2016, 1:53 PM  [place], [place], [address] Phone: [phone]   Fax:  [phone]  Name: Jaime Boone MRN: [id number] Date of Birth: Aug 12, 1940

## 2016-06-30 ENCOUNTER — Ambulatory Visit: Payer: Medicare Other

## 2016-07-02 ENCOUNTER — Ambulatory Visit: Payer: Medicare Other | Attending: Internal Medicine | Admitting: Physical Therapy

## 2016-07-02 ENCOUNTER — Encounter: Payer: Self-pay | Admitting: Physical Therapy

## 2016-07-02 DIAGNOSIS — M6281 Muscle weakness (generalized): Secondary | ICD-10-CM

## 2016-07-02 DIAGNOSIS — R262 Difficulty in walking, not elsewhere classified: Secondary | ICD-10-CM | POA: Diagnosis present

## 2016-07-02 DIAGNOSIS — M5382 Other specified dorsopathies, cervical region: Secondary | ICD-10-CM | POA: Insufficient documentation

## 2016-07-02 DIAGNOSIS — G8929 Other chronic pain: Secondary | ICD-10-CM | POA: Diagnosis present

## 2016-07-02 DIAGNOSIS — M25551 Pain in right hip: Secondary | ICD-10-CM | POA: Insufficient documentation

## 2016-07-02 DIAGNOSIS — R29898 Other symptoms and signs involving the musculoskeletal system: Secondary | ICD-10-CM | POA: Diagnosis present

## 2016-07-02 DIAGNOSIS — M25561 Pain in right knee: Secondary | ICD-10-CM | POA: Insufficient documentation

## 2016-07-02 DIAGNOSIS — M2560 Stiffness of unspecified joint, not elsewhere classified: Secondary | ICD-10-CM | POA: Insufficient documentation

## 2016-07-02 DIAGNOSIS — M545 Low back pain, unspecified: Secondary | ICD-10-CM

## 2016-07-02 DIAGNOSIS — R2681 Unsteadiness on feet: Secondary | ICD-10-CM | POA: Diagnosis present

## 2016-07-02 DIAGNOSIS — M25562 Pain in left knee: Secondary | ICD-10-CM | POA: Diagnosis present

## 2016-07-02 DIAGNOSIS — M5386 Other specified dorsopathies, lumbar region: Secondary | ICD-10-CM

## 2016-07-02 DIAGNOSIS — M62830 Muscle spasm of back: Secondary | ICD-10-CM | POA: Insufficient documentation

## 2016-07-02 DIAGNOSIS — M238X2 Other internal derangements of left knee: Secondary | ICD-10-CM | POA: Diagnosis present

## 2016-07-02 NOTE — Therapy (Signed)
[place], [place], [address] Phone: [phone]   Fax:  [phone]  Physical Therapy Treatment  Patient Details  Name: Jaime Boone MRN: [id number] Date of Birth: [birth year] Referring Provider: R. Charlies Constable, MD  Encounter Date: 07/02/2016      PT End of Session - 07/02/16 1120    Visit Number 7   Number of Visits 12   Date for PT Re-Evaluation 07/17/16   PT Start Time 0934   PT Stop Time 1015   PT Time Calculation (min) 41 min   Activity Tolerance Patient tolerated treatment well   Behavior During Therapy [facility] for tasks assessed/performed      Past Medical History:  Diagnosis Date  . Acid reflux   . Arthritis   . Asthma   . Cirrhosis ([place])   . 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 Degenerative joint disease   . Diabetes mellitus without complication ([place])   . Hepatitis C   . Hypertension     Past Surgical History:  Procedure Laterality Date  . ABDOMINAL HYSTERECTOMY     TAH  . APPENDECTOMY    . DILATION AND CURETTAGE OF UTERUS    . EYE SURGERY     CATARACT  . FOOT SURGERY     BUNIONECTOMY  . GALLBLADDER SURGERY    . NOSE SURGERY      There were no vitals filed for this visit.      Subjective Assessment - 07/02/16 1114    Subjective I have been sore with Arthritis.  I cancelled my last visits due to severe arthritis pain in my finger.  I could not grip to drive,  or go up/down the steps.  Knees 5-7/10 Back moderate.  It always hurts.   Currently in Pain? Yes   Pain Score 5    Pain Location Knee   Pain Orientation Right   Pain Descriptors / Indicators Aching;Sore   Pain Type Chronic pain   Pain Frequency Intermittent   Aggravating Factors  getting up from sitting.  steps   Pain Relieving Factors warm water,  heat   Multiple Pain Sites --  back pain chronic moderate with standing longer and walking longer.  Left knee mild arthritis, achey pain,   better with gentle moving around. exercises                         [facility] Adult PT Treatment/Exercise - 07/02/16 0001      Self-Care   Self-Care ADL's   ADL's Lifting,  squat practice,  small painful squats helped with the use of her hand.  Encouraged her to avoid forward trunk flexion with picking things uoo floor.     Lumbar Exercises: Supine   AB Set Limitations posteror pelvic tilt 1 0   X 5 seconds.     Knee/Hip Exercises: Stretches   Gastroc Stretch 3 reps;30 seconds   Gastroc Stretch Limitations incline board,     Knee/Hip Exercises: Aerobic   Nustep 6 minutes Legs only L5      Knee/Hip Exercises: Standing   Heel Raises 10 reps   Forward Step Up 5 reps;Hand Hold: 2;Step Height: 6"   Forward Step Up Limitations Less CGA today   Functional Squat 5 reps  2 sets ,  1 set hip hinge  difficult   Functional Squat Limitations painful,  small motions     Knee/Hip Exercises: Seated   Long  Arc [employer] 10 reps   Long Arc Quad Weight 4 lbs.   Long [employer] Limitations 10, aa lift left,  AROM right, 0 LBS 10 X   Hamstring Curl 10 reps;2 sets   Hamstring Limitations green band     Knee/Hip Exercises: Supine   Straight Leg Raises Limitations AA right, 2 x,   left 5 X    Other Supine Knee/Hip Exercises clams with green band x20, then bent leg lift x 15 RT/LT alternating with core brace                 PT Education - 07/02/16 1126    Education provided Yes   Education Details lifting , squat practice.   Person(s) Educated Patient   Methods Explanation;Demonstration;Verbal cues   Comprehension Verbalized understanding;Returned demonstration;Need further instruction          PT Short Term Goals - 07/02/16 1126      PT SHORT TERM GOAL #1   Title he will be independent with inital HEP   Baseline not consistant, not motivated   Time 3   Period Weeks   Status On-going     PT SHORT TERM GOAL #2   Title pt will increase her FOTO score by > 5 points to  demonstrate increased functional capacity (10/30/2014)   Time 3   Period Weeks   Status Unable to assess     PT SHORT TERM GOAL #3   Title she will be able to verbalize and demonstrate techniques to reduce hip/low back reinjury via postural awareness, lifting and carrying mechanics and HEP    Baseline education continued today   Time 3   Period Weeks   Status On-going     PT SHORT TERM GOAL #4   Title She will report pain decreased 30%    Baseline varies   Time 3   Period Weeks   Status On-going           PT Long Term Goals - 06/04/16 0915      PT LONG TERM GOAL #1   Title upon discharge pt will be I with all HEP given throughout therapy    Time 6   Period Weeks   Status New     PT LONG TERM GOAL #2   Title pt will increase bil LE strenght to >4/5 to assist walking endurance and safety    Time 6   Period Weeks   Status New     PT LONG TERM GOAL #3   Title she will demontrate < 4/10 pain during and following prolonged walking/standing for > 30 min, and ascending/descend > 12 steps to help with community amb   Time 6   Period Weeks   Status New     PT LONG TERM GOAL #4   Title she will report pain in knee decreased 50$ with walking in home   Time 6   Period Weeks   Status New     PT LONG TERM GOAL #5   Title  pt will demonstate FOTO score of <60% limited to demonstrate improved functional capacity upon discharge    Time 6   Period Weeks   Status New               Plan - 07/02/16 1120    Clinical Impression Statement Patient needs extra time to move today.  She is stiff and sore.  She had a set back with pain flare in her finger.  She needs on  going encouragement to keep up her HEP. She declined the need for modalities. She was able to demo modified squat with hand support  after instruction.    PT Next Visit Plan LE  strength, modalities for pain,  work toward goals.    PT Home Exercise Plan clams with bands,  abdominal bracing with 07/02/22 hips/ knees,   dead bug,    Consulted and Agree with Plan of Care Patient      Patient will benefit from skilled therapeutic intervention in order to improve the following deficits and impairments:  Pain, Difficulty walking, Decreased range of motion, Decreased strength, Decreased activity tolerance  Visit Diagnosis: Chronic midline low back pain without sciatica  Muscle weakness (generalized)  Chronic pain of left knee  Crepitus of joint of left knee  Muscle spasm of back  Decreased ROM of lumbar spine  Difficulty in walking, not elsewhere classified  Chronic pain of right knee     Problem List Patient Active Problem List   Diagnosis Date Noted  . Menopause 03/16/2016  . Keratosis 04/17/2015  . Vaginal lesion 03/14/2015  . Sebaceous cyst of labia 04/06/2012  . History of hepatitis C 04/06/2012  . Constipation 04/06/2012  . Postmenopausal 03/01/2012  . Vaginal atrophy 03/01/2012    Mialee Weyman PTA 07/02/2016, 11:29 AM  [facility] [address] [place], [place], [address] Phone: [phone]   Fax:  [phone]  Name: Jaime Boone MRN: [id number] Date of Birth: 1940-07-30

## 2016-07-07 ENCOUNTER — Ambulatory Visit: Payer: Medicare Other | Admitting: Physical Therapy

## 2016-07-07 ENCOUNTER — Encounter: Payer: Self-pay | Admitting: Physical Therapy

## 2016-07-07 DIAGNOSIS — M25561 Pain in right knee: Secondary | ICD-10-CM

## 2016-07-07 DIAGNOSIS — M6281 Muscle weakness (generalized): Secondary | ICD-10-CM

## 2016-07-07 DIAGNOSIS — R29898 Other symptoms and signs involving the musculoskeletal system: Secondary | ICD-10-CM

## 2016-07-07 DIAGNOSIS — M545 Low back pain, unspecified: Secondary | ICD-10-CM

## 2016-07-07 DIAGNOSIS — M62830 Muscle spasm of back: Secondary | ICD-10-CM

## 2016-07-07 DIAGNOSIS — M238X2 Other internal derangements of left knee: Secondary | ICD-10-CM

## 2016-07-07 DIAGNOSIS — R262 Difficulty in walking, not elsewhere classified: Secondary | ICD-10-CM

## 2016-07-07 DIAGNOSIS — G8929 Other chronic pain: Secondary | ICD-10-CM

## 2016-07-07 DIAGNOSIS — R2681 Unsteadiness on feet: Secondary | ICD-10-CM

## 2016-07-07 DIAGNOSIS — M25562 Pain in left knee: Secondary | ICD-10-CM

## 2016-07-07 DIAGNOSIS — M5386 Other specified dorsopathies, lumbar region: Secondary | ICD-10-CM

## 2016-07-07 NOTE — Therapy (Signed)
[place], [place], [address] Phone: [phone]   Fax:  [phone]  Physical Therapy Treatment  Patient Details  Name: Jaime Boone MRN: [id number] Date of Birth: 11/22/1940 Referring Provider: R. Charlies Constable, MD  Encounter Date: 07/07/2016      PT End of Session - 07/07/16 1038    Visit Number 8   Number of Visits 12   Date for PT Re-Evaluation 07/17/16   PT Start Time 0940   PT Stop Time 1022   PT Time Calculation (min) 42 min   Activity Tolerance Patient tolerated treatment well   Behavior During Therapy [facility] for tasks assessed/performed      Past Medical History:  Diagnosis Date  . Acid reflux   . Arthritis   . Asthma   . Cirrhosis ([place])   . 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 Degenerative joint disease   . Diabetes mellitus without complication ([place])   . Hepatitis C   . Hypertension     Past Surgical History:  Procedure Laterality Date  . ABDOMINAL HYSTERECTOMY     TAH  . APPENDECTOMY    . DILATION AND CURETTAGE OF UTERUS    . EYE SURGERY     CATARACT  . FOOT SURGERY     BUNIONECTOMY  . GALLBLADDER SURGERY    . NOSE SURGERY      There were no vitals filed for this visit.      Subjective Assessment - 07/07/16 0943    Subjective Saturday Pain increased with walking. up to 7/10.  It was gone when I went to bed.   I was able to walk up the steps Sat evening.  I was tired,  no pain.  I do some of my exercises every day   Currently in Pain? Yes   Pain Score 7    Pain Location Knee   Pain Orientation Right   Pain Descriptors / Indicators Aching;Sore   Pain Frequency Intermittent   Aggravating Factors  Cold wet weather   Pain Relieving Factors not sure what helped                         [facility] Adult PT Treatment/Exercise - 07/07/16 0001      Self-Care   ADL's demo technique for getting in /out og tub.      Knee/Hip Exercises:  Stretches   Passive Hamstring Stretch 3 reps;30 seconds  Both     Knee/Hip Exercises: Aerobic   Nustep 6 minutes Legs only L5      Knee/Hip Exercises: Standing   Heel Raises 10 reps  toe lifts,less pain with cues to keep knees slightly flexed   Forward Step Up Both;5 reps;Hand Hold: 2;Step Height: 4"   Forward Step Up Limitations Difficult,l weak vs pain   Functional Squat 5 reps   Functional Squat Limitations hip hinge and at counter,  less pain with hip hinge, moderate cues.   SLS 2-3 seconds at best,  both 5 x each,  SBA  Intermitant hands     Knee/Hip Exercises: Seated   Marching Limitations 10 x   Marching Weights --  blue bands   Hamstring Curl 10 reps;2 sets   Hamstring Limitations blue band   Sit to Sand --  3 reps,  pain     Knee/Hip Exercises: Supine   Short Arc Quad Sets 20 reps;Both   Short Arc [employer] Limitations No weights,  PT Education - 07/07/16 1037    Education provided Yes   Education Details How to get in/out of tub.   Person(s) Educated Patient   Methods Explanation;Demonstration   Comprehension Verbalized understanding          PT Short Term Goals - 07/07/16 1042      PT SHORT TERM GOAL #1   Title She will be independent with inital HEP   Baseline does some every day,  motivation increasing   Time 3   Period Weeks   Status On-going     PT SHORT TERM GOAL #2   Title pt will increase her FOTO score by > 5 points to demonstrate increased functional capacity (10/30/2014)   Time 3   Period Weeks   Status On-going     PT SHORT TERM GOAL #3   Title she will be able to verbalize and demonstrate techniques to reduce hip/low back reinjury via postural awareness, lifting and carrying mechanics and HEP    Baseline working on hip hinge for this   Time 3   Period Weeks   Status On-going     PT SHORT TERM GOAL #4   Title She will report pain decreased 30%    Baseline varies   Time 3   Period Weeks   Status On-going            PT Long Term Goals - 06/04/16 0915      PT LONG TERM GOAL #1   Title upon discharge pt will be I with all HEP given throughout therapy    Time 6   Period Weeks   Status New     PT LONG TERM GOAL #2   Title pt will increase bil LE strenght to >4/5 to assist walking endurance and safety    Time 6   Period Weeks   Status New     PT LONG TERM GOAL #3   Title she will demontrate < 4/10 pain during and following prolonged walking/standing for > 30 min, and ascending/descend > 12 steps to help with community amb   Time 6   Period Weeks   Status New     PT LONG TERM GOAL #4   Title she will report pain in knee decreased 50$ with walking in home   Time 6   Period Weeks   Status New     PT LONG TERM GOAL #5   Title  pt will demonstate FOTO score of <60% limited to demonstrate improved functional capacity upon discharge    Time [place] - 07/07/16 1039    Clinical Impression Statement Patient focused on Leg strengthening. Trial right knee taping to decrease lateral tracking.  Patient reports she is more functional on stairs.  Able to ascend steps one at a time without pain.   PT Next Visit Plan LE  strength, modalities for pain,  work toward goals.    PT Home Exercise Plan clams with bands,  abdominal bracing with Jun 28, 2022 hips/ knees,  dead bug, FOTO   Consulted and Agree with Plan of Care Patient      Patient will benefit from skilled therapeutic intervention in order to improve the following deficits and impairments:  Pain, Difficulty walking, Decreased range of motion, Decreased strength, Decreased activity tolerance  Visit Diagnosis: Chronic midline low back pain without sciatica  Muscle weakness (generalized)  Chronic  pain of left knee  Crepitus of joint of left knee  Muscle spasm of back  Decreased ROM of lumbar spine  Difficulty in walking, not elsewhere classified  Chronic pain of right  knee  Unsteadiness on feet  Weakness of both legs     Problem List Patient Active Problem List   Diagnosis Date Noted  . Menopause 03/16/2016  . Keratosis 04/17/2015  . Vaginal lesion 03/14/2015  . Sebaceous cyst of labia 04/06/2012  . History of hepatitis C 04/06/2012  . Constipation 04/06/2012  . Postmenopausal 03/01/2012  . Vaginal atrophy 03/01/2012    HARRIS,KAREN PTA 07/07/2016, 10:45 AM  [place], [place], [address] Phone: [phone]   Fax:  [phone]  Name: Jaime Boone MRN: [id number] Date of Birth: 03/18/1941

## 2016-07-09 ENCOUNTER — Ambulatory Visit: Payer: Medicare Other | Admitting: Physical Therapy

## 2016-07-09 ENCOUNTER — Encounter: Payer: Self-pay | Admitting: Physical Therapy

## 2016-07-09 DIAGNOSIS — G8929 Other chronic pain: Secondary | ICD-10-CM

## 2016-07-09 DIAGNOSIS — M62830 Muscle spasm of back: Secondary | ICD-10-CM

## 2016-07-09 DIAGNOSIS — M545 Low back pain: Principal | ICD-10-CM

## 2016-07-09 DIAGNOSIS — M25562 Pain in left knee: Secondary | ICD-10-CM

## 2016-07-09 DIAGNOSIS — M5386 Other specified dorsopathies, lumbar region: Secondary | ICD-10-CM

## 2016-07-09 DIAGNOSIS — M238X2 Other internal derangements of left knee: Secondary | ICD-10-CM

## 2016-07-09 DIAGNOSIS — M25561 Pain in right knee: Secondary | ICD-10-CM

## 2016-07-09 DIAGNOSIS — M25551 Pain in right hip: Secondary | ICD-10-CM

## 2016-07-09 DIAGNOSIS — R262 Difficulty in walking, not elsewhere classified: Secondary | ICD-10-CM

## 2016-07-09 DIAGNOSIS — R29898 Other symptoms and signs involving the musculoskeletal system: Secondary | ICD-10-CM

## 2016-07-09 DIAGNOSIS — M6281 Muscle weakness (generalized): Secondary | ICD-10-CM

## 2016-07-09 DIAGNOSIS — R2681 Unsteadiness on feet: Secondary | ICD-10-CM

## 2016-07-09 NOTE — Therapy (Signed)
[place], [place], [address] Phone: [phone]   Fax:  [phone]  Physical Therapy Treatment  Patient Details  Name: Jaime Boone MRN: [id number] Date of Birth: 1940-04-08 Referring Provider: R. Charlies Constable, MD  Encounter Date: 07/09/2016      PT End of Session - 07/09/16 1119    Visit Number 9   Number of Visits 12   Date for PT Re-Evaluation 07/17/16   PT Start Time 0933   PT Stop Time 1015   PT Time Calculation (min) 42 min   Activity Tolerance Patient tolerated treatment well   Behavior During Therapy [facility] for tasks assessed/performed      Past Medical History:  Diagnosis Date  . Acid reflux   . Arthritis   . Asthma   . Cirrhosis ([place])   . 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 Degenerative joint disease   . Diabetes mellitus without complication ([place])   . Hepatitis C   . Hypertension     Past Surgical History:  Procedure Laterality Date  . ABDOMINAL HYSTERECTOMY     TAH  . APPENDECTOMY    . DILATION AND CURETTAGE OF UTERUS    . EYE SURGERY     CATARACT  . FOOT SURGERY     BUNIONECTOMY  . GALLBLADDER SURGERY    . NOSE SURGERY      There were no vitals filed for this visit.      Subjective Assessment - 07/09/16 1001    Subjective I used the hinge technique this morning and it did not hurt.  I brought in the paper I wrote for things that cause pain.Sharp pain with walking, right knee and leg..  Painful                         [facility] Adult PT Treatment/Exercise - 07/09/16 0001      Self-Care   Self-Care Lifting;Other Self-Care Comments   Lifting lifting , ball from mat, from floor, carry ball,  some cues.    Other Self-Care Comments  After learning to lift,  patient used poor (trunk flexed with knees straight)  to get purse off mat     Knee/Hip Exercises: Stretches   Passive Hamstring Stretch 3 reps;30 seconds   Passive  Hamstring Stretch Limitations Strap,  HEP,   cues     Knee/Hip Exercises: Aerobic   Nustep 7 minutes LE only L$     Manual Therapy   Manual Therapy Taping   Manual therapy comments Knee felt supported.    McConnell Right knee to prevent lateral tracking                PT Education - 07/09/16 1114    Education provided Yes   Education Details HEP,  Lifting ,  carry  Knee anatony(tracking)   Person(s) Educated Patient   Methods Explanation;Demonstration;Tactile cues;Verbal cues   Comprehension Verbalized understanding;Returned demonstration          PT Short Term Goals - 07/09/16 1126      PT SHORT TERM GOAL #1   Title She will be independent with inital HEP   Baseline does some every day,  motivation increasing   Time 3   Period Weeks   Status On-going     PT SHORT TERM GOAL #2   Title pt will increase her FOTO score by > 5 points to demonstrate increased functional capacity (10/30/2014)  Baseline 74% limited on Eval ,  66% limited 07/09/2016   Time 3   Period Weeks   Status Achieved     PT SHORT TERM GOAL #3   Title she will be able to verbalize and demonstrate techniques to reduce hip/low back reinjury via postural awareness, lifting and carrying mechanics and HEP    Baseline Able to demo in clinic,  not yet consistant with life applicatons.   Time 3   Period Weeks   Status On-going     PT SHORT TERM GOAL #4   Title She will report pain decreased 30%    Time 3   Period Weeks   Status Unable to assess           PT Long Term Goals - 07/02/2016 0915      PT LONG TERM GOAL #1   Title upon discharge pt will be I with all HEP given throughout therapy    Time 6   Period Weeks   Status New     PT LONG TERM GOAL #2   Title pt will increase bil LE strenght to >4/5 to assist walking endurance and safety    Time 6   Period Weeks   Status New     PT LONG TERM GOAL #3   Title she will demontrate < 4/10 pain during and following prolonged walking/standing  for > 30 min, and ascending/descend > 12 steps to help with community amb   Time 6   Period Weeks   Status New     PT LONG TERM GOAL #4   Title she will report pain in knee decreased 50$ with walking in home   Time 6   Period Weeks   Status New     PT LONG TERM GOAL #5   Title  pt will demonstate FOTO score of <60% limited to demonstrate improved functional capacity upon discharge    Time 6   Period Weeks   Status New               Plan - 07/09/16 1119    Clinical Impression Statement Progress toward HEP goal.  Knee kinesiotex tape helped some, but did not last.  Trial of McConnell tape to prevent lateral tracking.  Patient able to demo good techniques with lifting in ckinic , but has not yet applied to real life activities.  She has been working on her posture in other ways.  FOTO:  34% ability improved from 26 % ability.  STG#2 met,  STG#3 partially met.   PT Next Visit Plan  Add LE  strength to HEP. modalities for pain,  work toward goals. Assess tape,     PT Home Exercise Plan clams with bands,  abdominal bracing with 07/03/2022 hips/ knees,  dead bug, .     Consulted and Agree with Plan of Care Patient      Patient will benefit from skilled therapeutic intervention in order to improve the following deficits and impairments:  Pain, Difficulty walking, Decreased range of motion, Decreased strength, Decreased activity tolerance  Visit Diagnosis: Chronic midline low back pain without sciatica  Muscle weakness (generalized)  Chronic pain of left knee  Crepitus of joint of left knee  Muscle spasm of back  Decreased ROM of lumbar spine  Difficulty in walking, not elsewhere classified  Chronic pain of right knee  Unsteadiness on feet  Weakness of both legs  Right hip pain     Problem List Patient Active Problem List  Diagnosis Date Noted  . Menopause 03/16/2016  . Keratosis 04/17/2015  . Vaginal lesion 03/14/2015  . Sebaceous cyst of labia 04/06/2012  .  History of hepatitis C 04/06/2012  . Constipation 04/06/2012  . Postmenopausal 03/01/2012  . Vaginal atrophy 03/01/2012    HARRIS,KAREN PTA 07/09/2016, 11:30 AM  [place], [place], [address] Phone: [phone]   Fax:  [phone]  Name: Jaime Boone MRN: [id number] Date of Birth: 1941-02-24

## 2016-07-09 NOTE — Patient Instructions (Signed)
Hamstring stretch added from exercise drawer. 1 x day 3 x 30 seconds,  Gentle, lifelong

## 2016-07-14 ENCOUNTER — Ambulatory Visit: Payer: Medicare Other

## 2016-07-14 DIAGNOSIS — M545 Low back pain, unspecified: Secondary | ICD-10-CM

## 2016-07-14 DIAGNOSIS — M25562 Pain in left knee: Secondary | ICD-10-CM

## 2016-07-14 DIAGNOSIS — M6281 Muscle weakness (generalized): Secondary | ICD-10-CM

## 2016-07-14 DIAGNOSIS — M62830 Muscle spasm of back: Secondary | ICD-10-CM

## 2016-07-14 DIAGNOSIS — G8929 Other chronic pain: Secondary | ICD-10-CM

## 2016-07-14 DIAGNOSIS — M238X2 Other internal derangements of left knee: Secondary | ICD-10-CM

## 2016-07-14 NOTE — Therapy (Signed)
[place], [place], [address] Phone: [phone]   Fax:  [phone]  Physical Therapy Treatment  Patient Details  Name: Jaime Boone MRN: [id number] Date of Birth: 1940/12/27 Referring Provider: R. Charlies Constable, MD  Encounter Date: 07/14/2016      PT End of Session - 07/14/16 1010    Visit Number 10   Number of Visits 15   Date for PT Re-Evaluation 07/31/16   Authorization Type [employer]   Authorization Time Period Kx at visit 15   PT Start Time 0925   PT Stop Time 1010   PT Time Calculation (min) 45 min   Activity Tolerance Patient tolerated treatment well;No increased pain   Behavior During Therapy [facility] for tasks assessed/performed      Past Medical History:  Diagnosis Date  . Acid reflux   . Arthritis   . Asthma   . Cirrhosis ([place])   . 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 Degenerative joint disease   . Diabetes mellitus without complication ([place])   . Hepatitis C   . Hypertension     Past Surgical History:  Procedure Laterality Date  . ABDOMINAL HYSTERECTOMY     TAH  . APPENDECTOMY    . DILATION AND CURETTAGE OF UTERUS    . EYE SURGERY     CATARACT  . FOOT SURGERY     BUNIONECTOMY  . GALLBLADDER SURGERY    . NOSE SURGERY      There were no vitals filed for this visit.      Subjective Assessment - 07/14/16 0935    Subjective No pain just feel weak.    Currently in Pain? No/denies                         [facility] Adult PT Treatment/Exercise - 07/14/16 0001      Lumbar Exercises: Supine   Bent Knee Raise 20 reps   Bridge 20 reps;3 seconds     Knee/Hip Exercises: Aerobic   Nustep 6 minutes LE only L5     Knee/Hip Exercises: Standing   Heel Raises Both;15 reps   Wall Squat 10 reps;5 seconds     Knee/Hip Exercises: Seated   Long Arc Quad Right;Left;20 reps     Knee/Hip Exercises: Supine   Quad Sets Left;20 reps   Quad Sets  Limitations 5 sec hold   Short Arc Quad Sets Both;10 reps   Short Arc Quad Sets Limitations then 10 reps 3 pounds each leg   Bridges with [employer] Both;15 reps  mostly glut set   Straight Leg Raises Left;AROM;2 sets;10 reps;Right;3 sets     Knee/Hip Exercises: Sidelying   Hip ABduction Left;2 sets;10 reps                  PT Short Term Goals - 07/14/16 1012      PT SHORT TERM GOAL #1   Title She will be independent with inital HEP   Status On-going     PT SHORT TERM GOAL #2   Title pt will increase her FOTO score by > 5 points to demonstrate increased functional capacity (10/30/2014)   Status Achieved     PT SHORT TERM GOAL #3   Title she will be able to verbalize and demonstrate techniques to reduce hip/low back reinjury via postural awareness, lifting and carrying mechanics and HEP    Baseline Able to demo in clinic,  not yet consistant with life applicatons.   Status Partially Met     PT SHORT TERM GOAL #4   Title She will report pain decreased 30%    Baseline varies   Status Partially Met           PT Long Term Goals - 2016/08/10 1013      PT LONG TERM GOAL #1   Title upon discharge pt will be I with all HEP given throughout therapy    Status On-going     PT LONG TERM GOAL #2   Title pt will increase bil LE strenght to >4/5 to assist walking endurance and safety    Baseline still weak <4/5   Status On-going     PT LONG TERM GOAL #3   Title she will demontrate < 4/10 pain during and following prolonged walking/standing for > 30 min, and ascending/descend > 12 steps to help with community amb   Baseline varies   Status Partially Met     PT LONG TERM GOAL #4   Title she will report pain in knee decreased 50% with walking in home   Status On-going     PT LONG TERM GOAL #5   Title  pt will demonstate FOTO score of <60% limited to demonstrate improved functional capacity upon discharge    Status Unable to assess               Plan -  Aug 10, 2016 1010    Clinical Impression Statement She did better today with exercise tolerating weight to quads and SLR no asssitst , incr reps. She  mildly complained but did all as requested. As she has done bette with strength exer will extend and progress HEP Strength exercises   PT Frequency 2x / week   PT Duration 3 weeks   PT Treatment/Interventions Cryotherapy;Iontophoresis '4mg'$ /ml Dexamethasone;Moist Heat;Therapeutic exercise;Gait training;Passive range of motion;Patient/family education;Taping;Manual techniques;Dry needling   PT Next Visit Plan Progress strength and add to HEP . add weight as tolerated . Modalities if needed   PT Home Exercise Plan clams with bands,  abdominal bracing with 2022-07-02 hips/ knees,  dead bug, .     Consulted and Agree with Plan of Care Patient      Patient will benefit from skilled therapeutic intervention in order to improve the following deficits and impairments:  Pain, Difficulty walking, Decreased range of motion, Decreased strength, Decreased activity tolerance  Visit Diagnosis: Chronic midline low back pain without sciatica  Muscle weakness (generalized)  Chronic pain of left knee  Crepitus of joint of left knee  Muscle spasm of back       G-Codes - 2016-08-10 0956    Functional Assessment Tool Used (Outpatient Only) FOTO 66% limited   Functional Limitation Mobility: Walking and moving around   Mobility: Walking and Moving Around Current Status [phone]) At least 60 percent but less than 80 percent impaired, limited or restricted   Mobility: Walking and Moving Around Goal Status [phone]) At least 40 percent but less than 60 percent impaired, limited or restricted      Problem List Patient Active Problem List   Diagnosis Date Noted  . Menopause 03/16/2016  . Keratosis 04/17/2015  . Vaginal lesion 03/14/2015  . Sebaceous cyst of labia 04/06/2012  . History of hepatitis C 04/06/2012  . Constipation 04/06/2012  . Postmenopausal 03/01/2012  .  Vaginal atrophy 03/01/2012    Darrel Hoover  PT 08-10-16, 10:14 AM  [place], [place], [address] Phone: [phone]   Fax:  [phone]  Name: Jaime Boone MRN: [id number] Date of Birth: 07/11/1940

## 2016-07-16 ENCOUNTER — Ambulatory Visit: Payer: Medicare Other | Admitting: Physical Therapy

## 2016-07-16 ENCOUNTER — Encounter: Payer: Self-pay | Admitting: Internal Medicine

## 2016-07-16 ENCOUNTER — Encounter: Payer: Self-pay | Admitting: Physical Therapy

## 2016-07-16 DIAGNOSIS — M545 Low back pain: Principal | ICD-10-CM

## 2016-07-16 DIAGNOSIS — G8929 Other chronic pain: Secondary | ICD-10-CM

## 2016-07-16 DIAGNOSIS — M25562 Pain in left knee: Secondary | ICD-10-CM

## 2016-07-16 DIAGNOSIS — M6281 Muscle weakness (generalized): Secondary | ICD-10-CM

## 2016-07-16 DIAGNOSIS — M238X2 Other internal derangements of left knee: Secondary | ICD-10-CM

## 2016-07-16 NOTE — Therapy (Signed)
[place] Bo[place], [place], [address] Phone: [phone]   Fax:  [phone]  Physical Therapy Treatment  Patient Details  Name: Jaime Boone MRN: [id number] Date of Birth: [birth year] Referring Provider: R. Charlies Constable, MD  Encounter Date: 07/16/2016      PT End of Session - 07/16/16 1013    Visit Number 11   Number of Visits 15   Date for PT Re-Evaluation 07/31/16   PT Start Time 0932   PT Stop Time 5361   PT Time Calculation (min) 42 min   Activity Tolerance Patient tolerated treatment well      Past Medical History:  Diagnosis Date  . Acid reflux   . Arthritis   . Asthma   . Cirrhosis ([place])   . 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 Degenerative joint disease   . Diabetes mellitus without complication ([place])   . Hepatitis C   . Hypertension     Past Surgical History:  Procedure Laterality Date  . ABDOMINAL HYSTERECTOMY     TAH  . APPENDECTOMY    . DILATION AND CURETTAGE OF UTERUS    . EYE SURGERY     CATARACT  . FOOT SURGERY     BUNIONECTOMY  . GALLBLADDER SURGERY    . NOSE SURGERY      There were no vitals filed for this visit.      Subjective Assessment - 07/16/16 0935    Subjective No pain. Knees are stiff    Pertinent History Chronic back and hip and knee pain   Currently in Pain? Yes   Pain Score 0-No pain   Pain Location Knee   Pain Orientation Right   Pain Descriptors / Indicators Aching;Sore   Pain Type Chronic pain   Pain Frequency Intermittent   Aggravating Factors  getting up from standing   Pain Relieving Factors PT                         [facility] Adult PT Treatment/Exercise - 07/16/16 0001      Knee/Hip Exercises: Seated   Hamstring Curl 10 reps;2 sets   Hamstring Limitations red band,  HEP     Knee/Hip Exercises: Supine   Quad Sets 15 reps   Quad Sets Limitations bOTH   Bridges 10 reps  2 reps.   Bridges with [employer] 10 reps;2 sets  both     Knee/Hip Exercises: Sidelying   Hip ABduction Left;2 sets;10 reps  green band                PT Education - 07/16/16 1013    Education provided Yes   Education Details HEP   Person(s) Educated Patient   Methods Explanation;Demonstration;Verbal cues;Handout   Comprehension Verbalized understanding;Returned demonstration          PT Short Term Goals - 07/14/16 1012      PT SHORT TERM GOAL #1   Title She will be independent with inital HEP   Status On-going     PT SHORT TERM GOAL #2   Title pt will increase her FOTO score by > 5 points to demonstrate increased functional capacity (10/30/2014)   Status Achieved     PT SHORT TERM GOAL #3   Title she will be able to verbalize and demonstrate techniques to reduce hip/low back reinjury via postural awareness, lifting and carrying mechanics and HEP    Baseline Able to demo  in clinic,  not yet consistant with life applicatons.   Status Partially Met     PT SHORT TERM GOAL #4   Title She will report pain decreased 30%    Baseline varies   Status Partially Met           PT Long Term Goals - 07/14/16 1013      PT LONG TERM GOAL #1   Title upon discharge pt will be I with all HEP given throughout therapy    Status On-going     PT LONG TERM GOAL #2   Title pt will increase bil LE strenght to >4/5 to assist walking endurance and safety    Baseline still weak <4/5   Status On-going     PT LONG TERM GOAL #3   Title she will demontrate < 4/10 pain during and following prolonged walking/standing for > 30 min, and ascending/descend > 12 steps to help with community amb   Baseline varies   Status Partially Met     PT LONG TERM GOAL #4   Title she will report pain in knee decreased 50% with walking in home   Status On-going     PT LONG TERM GOAL #5   Title  pt will demonstate FOTO score of <60% limited to demonstrate improved functional capacity upon discharge    Status Unable to  assess               Plan - 07/16/16 1013    Clinical Impression Statement Progress toward HEP goals.  She is exercising a little more.  No pain today.  Knees are stiff.  She feels she is more mobile now.  She is able to descend stairs one step at a  time.,  she is not yet ascending steps.   PT Next Visit Plan Progress strength  Try steps,  shorter vs taller . add weight as tolerated . Modalities if needed   PT Home Exercise Plan clams with bands,  abdominal bracing with 2022/06/08 hips/ knees,  dead bug, .  Hamstring band,  LAQ   Consulted and Agree with Plan of Care Patient      Patient will benefit from skilled therapeutic intervention in order to improve the following deficits and impairments:     Visit Diagnosis: Chronic midline low back pain without sciatica  Muscle weakness (generalized)  Chronic pain of left knee  Crepitus of joint of left knee     Problem List Patient Active Problem List   Diagnosis Date Noted  . Menopause 03/16/2016  . Keratosis 04/17/2015  . Vaginal lesion 03/14/2015  . Sebaceous cyst of labia 04/06/2012  . History of hepatitis C 04/06/2012  . Constipation 04/06/2012  . Postmenopausal 03/01/2012  . Vaginal atrophy 03/01/2012    Anjannette Gauger PTA 07/16/2016, 10:16 AM  [facility] [address] [place], [place], [address] Phone: [phone]   Fax:  [phone]  Name: Jaime Boone MRN: [id number] Date of Birth: Jun 27, 1940

## 2016-07-16 NOTE — Patient Instructions (Signed)
EX drawer:  Knee LAQ,  Hamstring band. 1-2 x a day 10-20 x each 0-5 second holds.

## 2016-07-21 ENCOUNTER — Ambulatory Visit: Payer: Medicare Other

## 2016-07-21 DIAGNOSIS — G8929 Other chronic pain: Secondary | ICD-10-CM

## 2016-07-21 DIAGNOSIS — M6281 Muscle weakness (generalized): Secondary | ICD-10-CM

## 2016-07-21 DIAGNOSIS — M25562 Pain in left knee: Secondary | ICD-10-CM

## 2016-07-21 DIAGNOSIS — M545 Low back pain: Secondary | ICD-10-CM | POA: Diagnosis not present

## 2016-07-21 DIAGNOSIS — M238X2 Other internal derangements of left knee: Secondary | ICD-10-CM

## 2016-07-21 DIAGNOSIS — M62830 Muscle spasm of back: Secondary | ICD-10-CM

## 2016-07-21 NOTE — Therapy (Signed)
[place], [place], [address] Phone: [phone]   Fax:  [phone]  Physical Therapy Treatment  Patient Details  Name: Jaime Boone MRN: [id number] Date of Birth: October 06, 1940 Referring Provider: R. Charlies Constable, MD  Encounter Date: 07/21/2016      PT End of Session - 07/21/16 0809    Visit Number 12   Number of Visits 15   Date for PT Re-Evaluation 07/31/16   Authorization Type U[employer] Medicare   Authorization Time Period Kx at visit 15   PT Start Time 0755   PT Stop Time 0842   PT Time Calculation (min) 47 min   Activity Tolerance Patient tolerated treatment well;No increased pain   Behavior During Therapy WFL for tasks assessed/performed      Past Medical History:  Diagnosis Date  . Acid reflux   . Arthritis   . Asthma   . Cirrhosis ([place])   . 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 Degenerative joint disease   . Diabetes mellitus without complication ([place])   . Hepatitis C   . Hypertension     Past Surgical History:  Procedure Laterality Date  . ABDOMINAL HYSTERECTOMY     TAH  . APPENDECTOMY    . DILATION AND CURETTAGE OF UTERUS    . EYE SURGERY     CATARACT  . FOOT SURGERY     BUNIONECTOMY  . GALLBLADDER SURGERY    . NOSE SURGERY      There were no vitals filed for this visit.      Subjective Assessment - 07/21/16 0805    Subjective No pain in knees except with turning in ed and this can wake me.  Sinus headache LT that will not go away. some back pain                         [facility] Adult PT Treatment/Exercise - 07/21/16 0001      Lumbar Exercises: Stretches   Pelvic Tilt Limitations 15 reps 5-10 sec    Piriformis Stretch Right;Left;20 seconds;2 reps     Lumbar Exercises: Supine   Bent Knee Raise 20 reps   Bent Knee Raise Limitations cued to pull with abdominals  x10 RT/LT   Bridge 15 reps;3 seconds   Other Supine Lumbar Exercises  Ball squeeze x 20 with glut set  and clam into green band x 20     Knee/Hip Exercises: Aerobic   Nustep 6 minutes LE only L5     Knee/Hip Exercises: Supine   Short Arc Quad Sets Both;20 reps   Short Arc Quad Sets Limitations 4 pounds each leg     Modalities   Modalities Moist Heat     Moist Heat Therapy   Number Minutes Moist Heat --  during treatment   Moist Heat Location Lumbar Spine                  PT Short Term Goals - 07/14/16 1012      PT SHORT TERM GOAL #1   Title She will be independent with inital HEP   Status On-going     PT SHORT TERM GOAL #2   Title pt will increase her FOTO score by > 5 points to demonstrate increased functional capacity (10/30/2014)   Status Achieved     PT SHORT TERM GOAL #3   Title she will be able to verbalize and demonstrate techniques to reduce hip/low  back reinjury via postural awareness, lifting and carrying mechanics and HEP    Baseline Able to demo in clinic,  not yet consistant with life applicatons.   Status Partially Met     PT SHORT TERM GOAL #4   Title She will report pain decreased 30%    Baseline varies   Status Partially Met           PT Long Term Goals - 07/14/16 1013      PT LONG TERM GOAL #1   Title upon discharge pt will be I with all HEP given throughout therapy    Status On-going     PT LONG TERM GOAL #2   Title pt will increase bil LE strenght to >4/5 to assist walking endurance and safety    Baseline still weak <4/5   Status On-going     PT LONG TERM GOAL #3   Title she will demontrate < 4/10 pain during and following prolonged walking/standing for > 30 min, and ascending/descend > 12 steps to help with community amb   Baseline varies   Status Partially Met     PT LONG TERM GOAL #4   Title she will report pain in knee decreased 50% with walking in home   Status On-going     PT LONG TERM GOAL #5   Title  pt will demonstate FOTO score of <60% limited to demonstrate improved functional  capacity upon discharge    Status Unable to assess               Plan - 07/21/16 0809    Clinical Impression Statement Back pain today.  improved post session. She is very negative about geting old and ailments that come with this.  Contin=ue per plan then discharge with HEP.    PT Treatment/Interventions Cryotherapy;Iontophoresis 38m/ml Dexamethasone;Moist Heat;Therapeutic exercise;Gait training;Passive range of motion;Patient/family education;Taping;Manual techniques;Dry needling   PT Next Visit Plan Progress strength  Try steps,  shorter vs taller . add weight as tolerated . Modalities if needed, Back more than knee treatment.  Goals   PT Home Exercise Plan clams with bands,  abdominal bracing with m04-13-2024hips/ knees,  dead bug, .  Hamstring band,  LAQ   Consulted and Agree with Plan of Care Patient      Patient will benefit from skilled therapeutic intervention in order to improve the following deficits and impairments:  Pain, Difficulty walking, Decreased range of motion, Decreased strength, Decreased activity tolerance  Visit Diagnosis: Chronic midline low back pain without sciatica  Muscle weakness (generalized)  Chronic pain of left knee  Crepitus of joint of left knee  Muscle spasm of back     Problem List Patient Active Problem List   Diagnosis Date Noted  . Menopause 03/16/2016  . Keratosis 04/17/2015  . Vaginal lesion 03/14/2015  . Sebaceous cyst of labia 04/06/2012  . History of hepatitis C 04/06/2012  . Constipation 04/06/2012  . Postmenopausal 03/01/2012  . Vaginal atrophy 03/01/2012    CDarrel Boone PT 07/21/2016, 8:35 AM  [facility] [address]Phone: [phone]  Fax:  [phone] Name: Jaime SlapeMRN: [id number] of Birth: 911/06/1940

## 2016-07-23 ENCOUNTER — Ambulatory Visit: Payer: Medicare Other

## 2016-07-23 DIAGNOSIS — M545 Low back pain, unspecified: Secondary | ICD-10-CM

## 2016-07-23 DIAGNOSIS — M6281 Muscle weakness (generalized): Secondary | ICD-10-CM

## 2016-07-23 DIAGNOSIS — M25562 Pain in left knee: Secondary | ICD-10-CM

## 2016-07-23 DIAGNOSIS — G8929 Other chronic pain: Secondary | ICD-10-CM

## 2016-07-23 DIAGNOSIS — M238X2 Other internal derangements of left knee: Secondary | ICD-10-CM

## 2016-07-23 DIAGNOSIS — M62830 Muscle spasm of back: Secondary | ICD-10-CM

## 2016-07-23 NOTE — Therapy (Signed)
[place], [place], [address] Phone: [phone]   Fax:  [phone]  Physical Therapy Treatment  Patient Details  Name: Jaime Boone MRN: [id number] Date of Birth: April 22, 1940 Referring Provider: R. Charlies Constable, MD  Encounter Date: 07/23/2016      PT End of Session - 07/23/16 0828    Visit Number 13   Number of Visits 15   Date for PT Re-Evaluation 07/31/16   Authorization Type [employer] Medicare   Authorization Time Period Kx at visit 15   PT Start Time 0830   PT Stop Time 0915   PT Time Calculation (min) 45 min   Activity Tolerance Patient tolerated treatment well;No increased pain   Behavior During Therapy WFL for tasks assessed/performed      Past Medical History:  Diagnosis Date  . Acid reflux   . Arthritis   . Asthma   . Cirrhosis ([place])   . 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 Degenerative joint disease   . Diabetes mellitus without complication ([place])   . Hepatitis C   . Hypertension     Past Surgical History:  Procedure Laterality Date  . ABDOMINAL HYSTERECTOMY     TAH  . APPENDECTOMY    . DILATION AND CURETTAGE OF UTERUS    . EYE SURGERY     CATARACT  . FOOT SURGERY     BUNIONECTOMY  . GALLBLADDER SURGERY    . NOSE SURGERY      There were no vitals filed for this visit.      Subjective Assessment - 07/23/16 0834    Subjective Doing well today.    Currently in Pain? No/denies                         [facility] Adult PT Treatment/Exercise - 07/23/16 0001      Ambulation/Gait   Stairs Assistance 6: Modified independent (Device/Increase time)   Stair Management Technique Two rails;Step to pattern  worked on step over but incr UE assist   Number of Stairs 12   Height of Stairs 6  and 4 inches .    Gait Comments She walks sideway on step 6 inches and uses RT leg though she states this is her worst leg      Lumbar Exercises: Stretches    Pelvic Tilt Limitations 15 reps 10 sec      Lumbar Exercises: Supine   Bridge 3 seconds;20 reps   Other Supine Lumbar Exercises dead bug x 20 RT/LT, then hip clam blue band x 20 with glut squeeze then ball squeeze and ab set x 20      Knee/Hip Exercises: Stretches   Piriformis Stretch Right;Left;2 reps;30 seconds     Knee/Hip Exercises: Aerobic   Nustep 6 minutes LE only L6     Knee/Hip Exercises: Standing   Heel Raises Both;15 reps   Lateral Step Up --   Wall Squat --     Knee/Hip Exercises: Seated   Long Arc Quad Right;Left;15 reps     Knee/Hip Exercises: Supine   Short Arc Quad Sets Both;20 reps   Short Arc Quad Sets Limitations 4pounds   Straight Leg Raises Right;Left;10 reps     Knee/Hip Exercises: Sidelying   Hip ABduction Right;Left;15 reps                  PT Short Term Goals - 07/14/16 1012      PT  SHORT TERM GOAL #1   Title She will be independent with inital HEP   Status On-going     PT SHORT TERM GOAL #2   Title pt will increase her FOTO score by > 5 points to demonstrate increased functional capacity (10/30/2014)   Status Achieved     PT SHORT TERM GOAL #3   Title she will be able to verbalize and demonstrate techniques to reduce hip/low back reinjury via postural awareness, lifting and carrying mechanics and HEP    Baseline Able to demo in clinic,  not yet consistant with life applicatons.   Status Partially Met     PT SHORT TERM GOAL #4   Title She will report pain decreased 30%    Baseline varies   Status Partially Met           PT Long Term Goals - 07/14/16 1013      PT LONG TERM GOAL #1   Title upon discharge pt will be I with all HEP given throughout therapy    Status On-going     PT LONG TERM GOAL #2   Title pt will increase bil LE strenght to >4/5 to assist walking endurance and safety    Baseline still weak <4/5   Status On-going     PT LONG TERM GOAL #3   Title she will demontrate < 4/10 pain during and following  prolonged walking/standing for > 30 min, and ascending/descend > 12 steps to help with community amb   Baseline varies   Status Partially Met     PT LONG TERM GOAL #4   Title she will report pain in knee decreased 50% with walking in home   Status On-going     PT LONG TERM GOAL #5   Title  pt will demonstate FOTO score of <60% limited to demonstrate improved functional capacity upon discharge    Status Unable to assess               Plan - 07/23/16 4097    Clinical Impression Statement She did all exercises without report of pain except with turning over to do sidelye exercises.  She may benefit form post rehab exercise class to keep pushing strengthening.    PT Treatment/Interventions Cryotherapy;Iontophoresis '4mg'$ /ml Dexamethasone;Moist Heat;Therapeutic exercise;Gait training;Passive range of motion;Patient/family education;Taping;Manual techniques;Dry needling   PT Next Visit Plan Progress strength  Try steps,  shorter vs taller . add weight as tolerated . Modalities if needed, Back more than knee treatment.  Goals  Add ball squeeze with abdominal sets, SAQ, wall sits, sidelye abduction   PT Home Exercise Plan clams with bands,  abdominal bracing with 2022/06/20 hips/ knees,  dead bug, .  Hamstring band,  LAQ   Consulted and Agree with Plan of Care Patient      Patient will benefit from skilled therapeutic intervention in order to improve the following deficits and impairments:  Pain, Difficulty walking, Decreased range of motion, Decreased strength, Decreased activity tolerance  Visit Diagnosis: Chronic midline low back pain without sciatica  Muscle weakness (generalized)  Chronic pain of left knee  Crepitus of joint of left knee  Muscle spasm of back     Problem List Patient Active Problem List   Diagnosis Date Noted  . Menopause 03/16/2016  . Keratosis 04/17/2015  . Vaginal lesion 03/14/2015  . Sebaceous cyst of labia 04/06/2012  . History of hepatitis C  04/06/2012  . Constipation 04/06/2012  . Postmenopausal 03/01/2012  . Vaginal atrophy 03/01/2012  Darrel Hoover  PT 07/23/2016, 9:14 AM  [facility] [address] [place], [place], [address] Phone: [phone]   Fax:  [phone]  Name: Jaime Boone MRN: [id number] Date of Birth: 08/22/1940

## 2016-07-28 ENCOUNTER — Ambulatory Visit: Payer: Medicare Other | Attending: Internal Medicine

## 2016-07-28 DIAGNOSIS — G8929 Other chronic pain: Secondary | ICD-10-CM | POA: Diagnosis present

## 2016-07-28 DIAGNOSIS — M545 Low back pain, unspecified: Secondary | ICD-10-CM

## 2016-07-28 DIAGNOSIS — M6281 Muscle weakness (generalized): Secondary | ICD-10-CM

## 2016-07-28 DIAGNOSIS — M5382 Other specified dorsopathies, cervical region: Secondary | ICD-10-CM | POA: Insufficient documentation

## 2016-07-28 DIAGNOSIS — M62830 Muscle spasm of back: Secondary | ICD-10-CM | POA: Diagnosis present

## 2016-07-28 DIAGNOSIS — M238X2 Other internal derangements of left knee: Secondary | ICD-10-CM | POA: Diagnosis present

## 2016-07-28 DIAGNOSIS — M25562 Pain in left knee: Secondary | ICD-10-CM | POA: Insufficient documentation

## 2016-07-28 NOTE — Patient Instructions (Signed)
From cabinet issued side lye clam and hip abduction, supine SLR and post pelvic tilt with ball squeeze, SAQ  All 1x /day 10-20 reps  Hold 1-10 sec

## 2016-07-28 NOTE — Therapy (Signed)
[place], [place], [address] Phone: [phone]   Fax:  [phone]  Physical Therapy Treatment  Patient Details  Name: Jaime Boone MRN: [id number] Date of Birth: 03-13-1941 Referring Provider: R. Charlies Constable, MD  Encounter Date: 07/28/2016      PT End of Session - 07/28/16 0922    Visit Number 14   Number of Visits 15   Date for PT Re-Evaluation 07/31/16   Authorization Type UHC Medicare   Authorization Time Period Kx at visit 15   PT Start Time 0924   PT Stop Time 1008   PT Time Calculation (min) 44 min   Activity Tolerance Patient tolerated treatment well;No increased pain   Behavior During Therapy WFL for tasks assessed/performed      Past Medical History:  Diagnosis Date  . Acid reflux   . Arthritis   . Asthma   . Cirrhosis ([place])   . 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 Degenerative joint disease   . Diabetes mellitus without complication ([place])   . Hepatitis C   . Hypertension     Past Surgical History:  Procedure Laterality Date  . ABDOMINAL HYSTERECTOMY     TAH  . APPENDECTOMY    . DILATION AND CURETTAGE OF UTERUS    . EYE SURGERY     CATARACT  . FOOT SURGERY     BUNIONECTOMY  . GALLBLADDER SURGERY    . NOSE SURGERY      There were no vitals filed for this visit.      Subjective Assessment - 07/28/16 0927    Subjective Feel good today no pain. slept well   Currently in Pain? No/denies                         [facility] Adult PT Treatment/Exercise - 07/28/16 0001      Therapeutic Activites    Therapeutic Activities Other Therapeutic Activities   Other Therapeutic Activities worked on rolling LT andRT  with use of neck shoulder and legs in combo to roll without UE pulling.      Knee/Hip Exercises: Aerobic   Nustep 6 minutes LE only L6                PT Education - 07/28/16 0954    Education provided Yes   Education Details HEP see Instructions   Person(s) Educated Patient   Methods Explanation;Tactile cues;Verbal cues;Handout   Comprehension Returned demonstration;Verbalized understanding          PT Short Term Goals - 07/14/16 1012      PT SHORT TERM GOAL #1   Title She will be independent with inital HEP   Status On-going     PT SHORT TERM GOAL #2   Title pt will increase her FOTO score by > 5 points to demonstrate increased functional capacity (10/30/2014)   Status Achieved     PT SHORT TERM GOAL #3   Title she will be able to verbalize and demonstrate techniques to reduce hip/low back reinjury via postural awareness, lifting and carrying mechanics and HEP    Baseline Able to demo in clinic,  not yet consistant with life applicatons.   Status Partially Met     PT SHORT TERM GOAL #4   Title She will report pain decreased 30%    Baseline varies   Status Partially Met           PT  Long Term Goals - 07/14/16 1013      PT LONG TERM GOAL #1   Title upon discharge pt will be I with all HEP given throughout therapy    Status On-going     PT LONG TERM GOAL #2   Title pt will increase bil LE strenght to >4/5 to assist walking endurance and safety    Baseline still weak <4/5   Status On-going     PT LONG TERM GOAL #3   Title she will demontrate < 4/10 pain during and following prolonged walking/standing for > 30 min, and ascending/descend > 12 steps to help with community amb   Baseline varies   Status Partially Met     PT LONG TERM GOAL #4   Title she will report pain in knee decreased 50% with walking in home   Status On-going     PT LONG TERM GOAL #5   Title  pt will demonstate FOTO score of <60% limited to demonstrate improved functional capacity upon discharge    Status Unable to assess               Plan - 07/28/16 3474    Clinical Impression Statement No pain today . Added to HEP. She was able to do correctly after instruction.  Ready for discharge next  visit.  She will go to [facility] for exercise tomorrow.    PT Treatment/Interventions Cryotherapy;Iontophoresis '4mg'$ /ml Dexamethasone;Moist Heat;Therapeutic exercise;Gait training;Passive range of motion;Patient/family education;Taping;Manual techniques;Dry needling   PT Next Visit Plan Review HEP        FOTO    Discharge            KX vixit 15   PT Home Exercise Plan clams with bands,  abdominal bracing with 08-Jun-2022 hips/ knees,  dead bug, .  Hamstring band,  LAQ   Consulted and Agree with Plan of Care Patient      Patient will benefit from skilled therapeutic intervention in order to improve the following deficits and impairments:  Pain, Difficulty walking, Decreased range of motion, Decreased strength, Decreased activity tolerance  Visit Diagnosis: Chronic midline low back pain without sciatica  Muscle weakness (generalized)  Chronic pain of left knee  Crepitus of joint of left knee  Muscle spasm of back     Problem List Patient Active Problem List   Diagnosis Date Noted  . Menopause 03/16/2016  . Keratosis 04/17/2015  . Vaginal lesion 03/14/2015  . Sebaceous cyst of labia 04/06/2012  . History of hepatitis C 04/06/2012  . Constipation 04/06/2012  . Postmenopausal 03/01/2012  . Vaginal atrophy 03/01/2012    Darrel Hoover PT  07/28/2016, 10:10 AM  [facility] [address] [place], [place], [address] Phone: [phone]   Fax:  [phone]  Name: Jaime Boone MRN: [id number] Date of Birth: Dec 19, 1940

## 2016-07-30 ENCOUNTER — Ambulatory Visit: Payer: Medicare Other

## 2016-07-30 DIAGNOSIS — M25562 Pain in left knee: Secondary | ICD-10-CM

## 2016-07-30 DIAGNOSIS — M545 Low back pain: Principal | ICD-10-CM

## 2016-07-30 DIAGNOSIS — M238X2 Other internal derangements of left knee: Secondary | ICD-10-CM

## 2016-07-30 DIAGNOSIS — G8929 Other chronic pain: Secondary | ICD-10-CM

## 2016-07-30 DIAGNOSIS — M5386 Other specified dorsopathies, lumbar region: Secondary | ICD-10-CM

## 2016-07-30 DIAGNOSIS — M6281 Muscle weakness (generalized): Secondary | ICD-10-CM

## 2016-07-30 DIAGNOSIS — M62830 Muscle spasm of back: Secondary | ICD-10-CM

## 2016-07-30 NOTE — Therapy (Signed)
[place], [place], [address] Phone: [phone]   Fax:  [phone]  Physical Therapy Treatment/ Discharge  Patient Details  Name: Jaime Boone MRN: [id number] Date of Birth: Oct 09, 1940 Referring Provider: R. Charlies Constable, MD  Encounter Date: 07/30/2016      PT End of Session - 07/30/16 0924    Visit Number 15   Number of Visits 15   Date for PT Re-Evaluation 07/31/16   Authorization Type [employer] Medicare   Authorization Time Period Kx at visit 15   PT Start Time 0915   PT Stop Time 1000   PT Time Calculation (min) 45 min   Activity Tolerance Patient tolerated treatment well;No increased pain   Behavior During Therapy WFL for tasks assessed/performed      Past Medical History:  Diagnosis Date  . Acid reflux   . Arthritis   . Asthma   . Cirrhosis ([place])   . 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 Degenerative joint disease   . Diabetes mellitus without complication ([place])   . Hepatitis C   . Hypertension     Past Surgical History:  Procedure Laterality Date  . ABDOMINAL HYSTERECTOMY     TAH  . APPENDECTOMY    . DILATION AND CURETTAGE OF UTERUS    . EYE SURGERY     CATARACT  . FOOT SURGERY     BUNIONECTOMY  . GALLBLADDER SURGERY    . NOSE SURGERY      There were no vitals filed for this visit.      Subjective Assessment - 07/30/16 0925    Subjective Doing well . Good workout without pain. Headache today and finger hurts   Currently in Pain? No/denies  nild headache and Rt index fonger pain            [facility] PT Assessment - 07/30/16 0001      Observation/Other Assessments   Focus on Therapeutic Outcomes (FOTO)  65% limited  , no change in month     Strength   Right Hip Flexion 4-/5   Right Hip Extension 3+/5   Right Hip ABduction 3/5   Left Hip Flexion 4/5   Left Hip Extension 3+/5   Left Hip ABduction 4-/5   Right Knee Flexion 4/5   Right Knee  Extension 4/5   Left Knee Flexion 4/5   Left Knee Extension 4-/5     Ambulation/Gait   Stairs Assistance 6: Modified independent (Device/Increase time)   Stair Management Technique Two rails;Step to pattern                     [facility] Adult PT Treatment/Exercise - 07/30/16 0001      Therapeutic Activites    Other Therapeutic Activities She demo understanding of rolling with whole body     Lumbar Exercises: Stretches   Pelvic Tilt Limitations 15 reps 5 sec  and wih LE exercise for core strength     Lumbar Exercises: Supine   Other Supine Lumbar Exercises dead bug x 20 RT/LT, then hip clam blue band ( issued for home) x 20 with glut squeeze then ball squeeze and ab set x 20      Knee/Hip Exercises: Aerobic   Nustep 7 minutes LE only L6     Knee/Hip Exercises: Seated   Long Arc Quad 20 reps;Right;Left     Knee/Hip Exercises: Supine   Straight Leg Raises Right;Left;10 reps     Knee/Hip  Exercises: Sidelying   Hip ABduction Right;Left;15 reps                PT Education - 07/30/16 0944    Education provided Yes   Education Details Review of HEP Cues to add abdominal contraction with exercise to engage core stability   Person(s) Educated Patient   Methods Explanation;Verbal cues   Comprehension Verbalized understanding;Returned demonstration          PT Short Term Goals - 07/14/16 1012      PT SHORT TERM GOAL #1   Title She will be independent with inital HEP   Status On-going     PT SHORT TERM GOAL #2   Title pt will increase her FOTO score by > 5 points to demonstrate increased functional capacity (10/30/2014)   Status Achieved     PT SHORT TERM GOAL #3   Title she will be able to verbalize and demonstrate techniques to reduce hip/low back reinjury via postural awareness, lifting and carrying mechanics and HEP    Baseline Able to demo in clinic,  not yet consistant with life applicatons.   Status Partially Met     PT SHORT TERM GOAL #4   Title  She will report pain decreased 30%    Baseline varies   Status Partially Met           PT Long Term Goals - 07/30/16 0958      PT LONG TERM GOAL #1   Title upon discharge pt will be I with all HEP given throughout therapy    Status Achieved     PT LONG TERM GOAL #2   Title pt will increase bil LE strenght to >4/5 to assist walking endurance and safety    Baseline still weak <4/5   Status Not Met     PT LONG TERM GOAL #3   Title she will demontrate < 4/10 pain during and following prolonged walking/standing for > 30 min, and ascending/descend > 12 steps to help with community amb   Baseline varies   Status Partially Met     PT LONG TERM GOAL #4   Title she will report pain in knee decreased 50% with walking in home   Status Achieved     PT LONG TERM GOAL #5   Title  pt will demonstate FOTO score of <60% limited to demonstrate improved functional capacity upon discharge    Baseline 65% limited   Status Not Met               Plan - 07/30/16 5361    Clinical Impression Statement No pain in back and knees today She was able to demo rolling technique .   She was able to do all HEP correctly.  She is involved in community exercise program. Her FOTO score was unchanged since 05/29/16.  Ready for discharge   PT Treatment/Interventions Cryotherapy;Iontophoresis '4mg'$ /ml Dexamethasone;Moist Heat;Therapeutic exercise;Gait training;Passive range of motion;Patient/family education;Taping;Manual techniques;Dry needling   PT Next Visit Plan Discharge today with HEP   PT Home Exercise Plan clams with bands,  abdominal bracing with 06-21-2022 hips/ knees,  dead bug, .  Hamstring band,  LAQ   Consulted and Agree with Plan of Care Patient      Patient will benefit from skilled therapeutic intervention in order to improve the following deficits and impairments:  Pain, Difficulty walking, Decreased range of motion, Decreased strength, Decreased activity tolerance  Visit Diagnosis: Chronic  midline low back pain without sciatica  Muscle weakness (  generalized)  Chronic pain of left knee  Crepitus of joint of left knee  Muscle spasm of back  Decreased ROM of lumbar spine       G-Codes - 25-Aug-2016 1000    Functional Assessment Tool Used (Outpatient Only) FOTO 65% limited   Functional Limitation Mobility: Walking and moving around   Mobility: Walking and Moving Around Current Status ([id number]) At least 60 percent but less than 80 percent impaired, limited or restricted   Mobility: Walking and Moving Around Goal Status [phone]) At least 20 percent but less than 40 percent impaired, limited or restricted   Mobility: Walking and Moving Around Discharge Status [phone]) At least 60 percent but less than 80 percent impaired, limited or restricted      Problem List Patient Active Problem List   Diagnosis Date Noted  . Menopause 03/16/2016  . Keratosis 04/17/2015  . Vaginal lesion 03/14/2015  . Sebaceous cyst of labia 04/06/2012  . History of hepatitis C 04/06/2012  . Constipation 04/06/2012  . Postmenopausal 03/01/2012  . Vaginal atrophy 03/01/2012    Darrel Hoover  PT 08-25-2016, 10:01 AM  [facility] [address] [place], [place], [address] Phone: [phone]   Fax:  [phone]  Name: Jaime Boone MRN: [id number] Date of Birth: 1940-10-23  PHYSICAL THERAPY DISCHARGE SUMMARY  Visits from Start of Care: 15  Current functional level related to goals / functional outcomes: See above   Remaining deficits: See above   Education / Equipment: HEP Plan: Patient agrees to discharge.  Patient goals were partially met. Patient is being discharged due to                                                     ?????    MAX benefit from PT                                                                               Noralee Stain PT     08-25-2016   10:03 AM

## 2016-08-12 ENCOUNTER — Encounter: Payer: Self-pay | Admitting: Gynecology

## 2016-08-18 ENCOUNTER — Ambulatory Visit: Payer: Medicare Other | Admitting: Internal Medicine

## 2016-10-07 ENCOUNTER — Encounter (HOSPITAL_COMMUNITY): Payer: Self-pay | Admitting: Emergency Medicine

## 2016-10-07 ENCOUNTER — Emergency Department (HOSPITAL_COMMUNITY): Payer: Medicare Other

## 2016-10-07 ENCOUNTER — Emergency Department (HOSPITAL_COMMUNITY)
Admission: EM | Admit: 2016-10-07 | Discharge: 2016-10-07 | Disposition: A | Discharge status: Home / Self Care | Payer: Medicare Other | Attending: Emergency Medicine | Admitting: Emergency Medicine

## 2016-10-07 DIAGNOSIS — J45909 Unspecified asthma, uncomplicated: Secondary | ICD-10-CM | POA: Insufficient documentation

## 2016-10-07 DIAGNOSIS — Z794 Long term (current) use of insulin: Secondary | ICD-10-CM | POA: Diagnosis not present

## 2016-10-07 DIAGNOSIS — E119 Type 2 diabetes mellitus without complications: Secondary | ICD-10-CM | POA: Insufficient documentation

## 2016-10-07 DIAGNOSIS — Z79899 Other long term (current) drug therapy: Secondary | ICD-10-CM | POA: Diagnosis not present

## 2016-10-07 DIAGNOSIS — Z87891 Personal history of nicotine dependence: Secondary | ICD-10-CM | POA: Insufficient documentation

## 2016-10-07 DIAGNOSIS — Z7984 Long term (current) use of oral hypoglycemic drugs: Secondary | ICD-10-CM | POA: Diagnosis not present

## 2016-10-07 DIAGNOSIS — R531 Weakness: Secondary | ICD-10-CM | POA: Insufficient documentation

## 2016-10-07 DIAGNOSIS — I10 Essential (primary) hypertension: Secondary | ICD-10-CM | POA: Insufficient documentation

## 2016-10-07 DIAGNOSIS — R7989 Other specified abnormal findings of blood chemistry: Secondary | ICD-10-CM

## 2016-10-07 DIAGNOSIS — R945 Abnormal results of liver function studies: Secondary | ICD-10-CM

## 2016-10-07 LAB — I-STAT TROPONIN, ED: Troponin i, poc: 0 ng/mL (ref 0.00–0.08)

## 2016-10-07 LAB — COMPREHENSIVE METABOLIC PANEL
ALBUMIN: 2.9 g/dL — AB (ref 3.5–5.0)
ALK PHOS: 308 U/L — AB (ref 38–126)
ALT: 135 U/L — AB (ref 14–54)
AST: 61 U/L — ABNORMAL HIGH (ref 15–41)
Anion gap: 10 (ref 5–15)
BILIRUBIN TOTAL: 1 mg/dL (ref 0.3–1.2)
BUN: 8 mg/dL (ref 6–20)
CALCIUM: 9 mg/dL (ref 8.9–10.3)
CO2: 19 mmol/L — AB (ref 22–32)
CREATININE: 1.31 mg/dL — AB (ref 0.44–1.00)
Chloride: 98 mmol/L — ABNORMAL LOW (ref 101–111)
GFR calc Af Amer: 46 mL/min — ABNORMAL LOW (ref >60)
GFR calc non Af Amer: 39 mL/min — ABNORMAL LOW (ref >60)
Glucose, Bld: 373 mg/dL — ABNORMAL HIGH (ref 65–99)
Potassium: 3.7 mmol/L (ref 3.5–5.1)
SODIUM: 127 mmol/L — AB (ref 135–145)
TOTAL PROTEIN: 7.5 g/dL (ref 6.5–8.1)

## 2016-10-07 LAB — CBC
HCT: 34.1 % — ABNORMAL LOW (ref 36.0–46.0)
Hemoglobin: 10.8 g/dL — ABNORMAL LOW (ref 12.0–15.0)
MCH: 20.3 pg — ABNORMAL LOW (ref 26.0–34.0)
MCHC: 31.7 g/dL (ref 30.0–36.0)
MCV: 64.1 fL — AB (ref 78.0–100.0)
PLATELETS: 368 10*3/uL (ref 150–400)
RBC: 5.32 MIL/uL — AB (ref 3.87–5.11)
RDW: 15.3 % (ref 11.5–15.5)
WBC: 10.9 10*3/uL — AB (ref 4.0–10.5)

## 2016-10-07 LAB — URINALYSIS, ROUTINE W REFLEX MICROSCOPIC
Bilirubin Urine: NEGATIVE
Glucose, UA: NEGATIVE mg/dL
Hgb urine dipstick: NEGATIVE
Ketones, ur: NEGATIVE mg/dL
Nitrite: NEGATIVE
PH: 6 (ref 5.0–8.0)
Protein, ur: 100 mg/dL — AB
SPECIFIC GRAVITY, URINE: 1.009 (ref 1.005–1.030)

## 2016-10-07 LAB — TSH: TSH: 2.319 u[IU]/mL (ref 0.350–4.500)

## 2016-10-07 MED ORDER — SODIUM CHLORIDE 0.9 % IV BOLUS (SEPSIS)
1000.0000 mL | Freq: Once | INTRAVENOUS | Status: AC
  Administered 2016-10-07: 1000 mL via INTRAVENOUS

## 2016-10-07 MED ORDER — PROMETHAZINE HCL 25 MG PO TABS: 25.0000 mg | ORAL_TABLET | Freq: Four times a day (QID) | ORAL | 0 refills | Status: DC | PRN

## 2016-10-07 MED ORDER — NITROFURANTOIN MONOHYD MACRO 100 MG PO CAPS: 100.0000 mg | ORAL_CAPSULE | Freq: Two times a day (BID) | ORAL | 0 refills | Status: DC

## 2016-10-07 NOTE — ED Triage Notes (Addendum)
Has been weak for a while saw the  Dr Friday and dx with UTI, now cannot eat and n o appetiti cannot take her meds for UTI so she called dr and he changed the med but still she is afraid to take it

## 2016-10-07 NOTE — ED Provider Notes (Signed)
[place] DEPT Provider Note   CSN: [id number] Arrival date & time: 10/07/16  0944     History   Chief Complaint Chief Complaint  Patient presents with  . Weakness    HPI Arabel Barcenas is a 76 y.o. female.  Patient is a 76 year old female with past medical history of diabetes, asthma, hypertension presenting with complaints of weakness. She states that this is been ongoing for several weeks, however has been worse over the past few days. She reports decreased energy and decreased appetite and by mouth intake. She was seen by her primary doctor and diagnosed with urinary tract infection. She took one antibiotic which seemed to make her nauseated, then was started on a second. The second antibiotic caused similar symptoms on the first dose so she quit taking it altogether. She denies any fevers or chills.   The history is provided by the patient.  Weakness  Primary symptoms comment: Generalized weakness. This is a new problem. Episode onset: Several weeks ago. The problem has been gradually worsening. There was no focality noted. There has been no fever. Pertinent negatives include no shortness of breath, no vomiting and no confusion.    Past Medical History:  Diagnosis Date  . Acid reflux   . Arthritis   . Asthma   . Cirrhosis ([place])   . 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 Degenerative joint disease   . Diabetes mellitus without complication ([place])   . Hepatitis C   . Hypertension   . Sleep apnea     Patient Active Problem List   Diagnosis Date Noted  . Menopause 03/16/2016  . Keratosis 04/17/2015  . Vaginal lesion 03/14/2015  . Sebaceous cyst of labia 04/06/2012  . History of hepatitis C 04/06/2012  . Constipation 04/06/2012  . Postmenopausal 03/01/2012  . Vaginal atrophy 03/01/2012    Past Surgical History:  Procedure Laterality Date  . ABDOMINAL HYSTERECTOMY     TAH  . APPENDECTOMY    . DILATION AND CURETTAGE OF UTERUS     . EYE SURGERY     CATARACT  . FOOT SURGERY     BUNIONECTOMY  . GALLBLADDER SURGERY    . NOSE SURGERY      OB History    Gravida Para Term Preterm AB Living   6 3 3   3 3    SAB TAB Ectopic Multiple Live Births   3       3       Home Medications    Prior to Admission medications   Medication Sig Start Date End Date Taking? Authorizing Provider  BD PEN NEEDLE NANO U/F 32G X 4 MM MISC  07/19/13   [provider]  Calcium Carbonate-Vitamin D (CALTRATE 600+D) 600-400 MG-UNIT per tablet Take 1 tablet by mouth daily.    [provider]  clobetasol cream (TEMOVATE) 1.06 % Apply 1 application topically 2 (two) times daily. Apply twice a week first week then once weekly as needed Patient not taking: Reported on 03/16/2016 06/05/15   Anastasio Auerbach, MD  fluticasone Asencion Islam) 50 MCG/ACT nasal spray  04/22/13   [provider]  glipiZIDE (GLUCOTROL) 10 MG tablet Take 5 mg by mouth daily before breakfast.     [provider]  insulin glargine (LANTUS) 100 UNIT/ML injection Inject into the skin at bedtime.    [provider]  levothyroxine (SYNTHROID, LEVOTHROID) 50 MCG tablet  04/27/13   [provider]  loratadine (CLARITIN) 10 MG  tablet Take 10 mg by mouth daily.    [provider]  losartan (COZAAR) 100 MG tablet Take 50 mg by mouth 2 (two) times daily.     [provider]  montelukast (SINGULAIR) 10 MG tablet Take 10 mg by mouth at bedtime.    [provider]  Omega-3 Fatty Acids (FISH OIL) 1000 MG CAPS Take by mouth.    [provider]  ONE TOUCH ULTRA TEST test strip  04/03/13   [provider]  pantoprazole (PROTONIX) 40 MG tablet Take 40 mg by mouth daily.    [provider]  Polyvinyl Alcohol-Povidone (MURINE TEARS FOR DRY EYES OP) Apply 1 application to eye.    [provider]  PROAIR HFA 108 (90 BASE) MCG/ACT inhaler  04/19/13   [provider]  triamcinolone  cream (KENALOG) 0.1 % Apply 1 application topically as needed. 09/18/13   [provider]    Family History Family History  Problem Relation Age of Onset  . Hypertension Mother   . Diabetes Mother   . Cancer Father        PROSTATE  . Hypertension Father   . Diabetes Father   . Heart disease Father   . Diabetes Sister   . Heart disease Brother   . Cancer Brother        PROSTATE  . Cancer Cousin        LUNG    Social History Social History  Substance Use Topics  . Smoking status: Former Smoker    Quit date: 03/01/1974  . Smokeless tobacco: Never Used  . Alcohol use No     Allergies   Aspirin; Codeine; and Penicillins   Review of Systems Review of Systems  Respiratory: Negative for shortness of breath.   Gastrointestinal: Negative for vomiting.  Neurological: Positive for weakness.  Psychiatric/Behavioral: Negative for confusion.  All other systems reviewed and are negative.    Physical Exam Updated Vital Signs BP 100/66 (BP Location: Right Arm)   Pulse (!) 58   Temp 98.9 F (37.2 C) (Oral)   Resp 20   SpO2 98%   Physical Exam  Constitutional: She is oriented to person, place, and time. She appears well-developed and well-nourished. No distress.  HENT:  Head: Normocephalic and atraumatic.  Mouth/Throat: Oropharynx is clear and moist.  Eyes: EOM are normal. Pupils are equal, round, and reactive to light.  Neck: Normal range of motion. Neck supple.  Cardiovascular: Normal rate and regular rhythm.  Exam reveals no gallop and no friction rub.   No murmur heard. Pulmonary/Chest: Effort normal and breath sounds normal. No respiratory distress. She has no wheezes.  Abdominal: Soft. Bowel sounds are normal. She exhibits no distension. There is no tenderness.  Musculoskeletal: Normal range of motion.  Neurological: She is alert and oriented to person, place, and time. No cranial nerve deficit. She exhibits normal muscle tone. Coordination normal.  Skin:  Skin is warm and dry. She is not diaphoretic.  Nursing note and vitals reviewed.    ED Treatments / Results  Labs (all labs ordered are listed, but only abnormal results are displayed) Labs Reviewed  CBC - Abnormal; Notable for the following:       Result Value   WBC 10.9 (*)    RBC 5.32 (*)    Hemoglobin 10.8 (*)    HCT 34.1 (*)    MCV 64.1 (*)    MCH 20.3 (*)    All other components within normal limits  COMPREHENSIVE  METABOLIC PANEL - Abnormal; Notable for the following:    Sodium 127 (*)    Chloride 98 (*)    CO2 19 (*)    Glucose, Bld 373 (*)    Creatinine, Ser 1.31 (*)    Albumin 2.9 (*)    AST 61 (*)    ALT 135 (*)    Alkaline Phosphatase 308 (*)    GFR calc non Af Amer 39 (*)    GFR calc Af Amer 46 (*)    All other components within normal limits  URINALYSIS, ROUTINE W REFLEX MICROSCOPIC  TSH  CBG MONITORING, ED  I-STAT TROPOININ, ED    EKG  EKG Interpretation None       Radiology No results found.  Procedures Procedures (including critical care time)  Medications Ordered in ED Medications  sodium chloride 0.9 % bolus 1,000 mL (not administered)     Initial Impression / Assessment and Plan / ED Course  I have reviewed the triage vital signs and the nursing notes.  Pertinent labs & imaging results that were available during my care of the patient were reviewed by me and considered in my medical decision making (see chart for details).  Patient presenting here with complaints of weakness and decreased appetite which has been worsening over the past several days. She reports becoming very nauseated with both Bactrim and Cipro which she has been prescribed for her UTI. She reports a history of throat swelling for penicillins, so she will be prescribed Macrobid to see if this works better.  I see nothing in her workup indicates anything else acute going on. She does have an elevation of her LFTs, the significance of which I am uncertain. She has a  history of hepatitis C, however tells me she has been treated for this and has been free of it for quite some time. I have added on and acute hepatitis panel which she can follow-up at home with her primary doctor.  She was given normal saline here and will be discharged with Phenergan, Macrobid, and is to follow-up.  Final Clinical Impressions(s) / ED Diagnoses   Final diagnoses:  None    New Prescriptions New Prescriptions   No medications on file     Veryl Speak, MD 10/07/16 1419

## 2016-10-07 NOTE — ED Notes (Signed)
Patient transported to Ultrasound 

## 2016-10-07 NOTE — ED Notes (Signed)
Got patient into a gown on the monitor got a warm blanket patient is resting

## 2016-10-07 NOTE — Discharge Instructions (Signed)
Macrobid as prescribed.  Phenergan as prescribed as needed for nausea.  Continue other medications as previously prescribed.  Follow-up your liver functions with your primary Dr. in the next week, and return to the emergency department if your symptoms significantly worsen or change.

## 2016-10-09 LAB — HEPATITIS PANEL, ACUTE
HCV Ab: >11 s/co ratio — ABNORMAL HIGH (ref 0.0–0.9)
HEP B S AG: NEGATIVE
Hep A IgM: NEGATIVE
Hep B C IgM: NEGATIVE

## 2016-10-11 ENCOUNTER — Encounter (HOSPITAL_COMMUNITY): Payer: Self-pay | Admitting: Nurse Practitioner

## 2016-10-11 ENCOUNTER — Observation Stay (HOSPITAL_COMMUNITY)
Admission: EM | Admit: 2016-10-11 | Discharge: 2016-10-14 | Disposition: A | Discharge status: Home / Self Care | Payer: Medicare Other | Attending: Internal Medicine | Admitting: Internal Medicine

## 2016-10-11 DIAGNOSIS — E119 Type 2 diabetes mellitus without complications: Secondary | ICD-10-CM | POA: Diagnosis not present

## 2016-10-11 DIAGNOSIS — R935 Abnormal findings on diagnostic imaging of other abdominal regions, including retroperitoneum: Secondary | ICD-10-CM

## 2016-10-11 DIAGNOSIS — G4730 Sleep apnea, unspecified: Secondary | ICD-10-CM | POA: Insufficient documentation

## 2016-10-11 DIAGNOSIS — K5900 Constipation, unspecified: Secondary | ICD-10-CM | POA: Diagnosis not present

## 2016-10-11 DIAGNOSIS — Z8619 Personal history of other infectious and parasitic diseases: Secondary | ICD-10-CM | POA: Diagnosis not present

## 2016-10-11 DIAGNOSIS — Z6835 Body mass index (BMI) 35.0-35.9, adult: Secondary | ICD-10-CM | POA: Diagnosis not present

## 2016-10-11 DIAGNOSIS — B179 Acute viral hepatitis, unspecified: Secondary | ICD-10-CM | POA: Diagnosis not present

## 2016-10-11 DIAGNOSIS — Z79899 Other long term (current) drug therapy: Secondary | ICD-10-CM | POA: Diagnosis not present

## 2016-10-11 DIAGNOSIS — K219 Gastro-esophageal reflux disease without esophagitis: Secondary | ICD-10-CM | POA: Diagnosis not present

## 2016-10-11 DIAGNOSIS — K759 Inflammatory liver disease, unspecified: Secondary | ICD-10-CM

## 2016-10-11 DIAGNOSIS — Z7951 Long term (current) use of inhaled steroids: Secondary | ICD-10-CM | POA: Insufficient documentation

## 2016-10-11 DIAGNOSIS — N2889 Other specified disorders of kidney and ureter: Secondary | ICD-10-CM | POA: Diagnosis not present

## 2016-10-11 DIAGNOSIS — K7460 Unspecified cirrhosis of liver: Secondary | ICD-10-CM | POA: Diagnosis not present

## 2016-10-11 DIAGNOSIS — D649 Anemia, unspecified: Secondary | ICD-10-CM | POA: Insufficient documentation

## 2016-10-11 DIAGNOSIS — I10 Essential (primary) hypertension: Secondary | ICD-10-CM | POA: Diagnosis present

## 2016-10-11 DIAGNOSIS — Z794 Long term (current) use of insulin: Secondary | ICD-10-CM | POA: Insufficient documentation

## 2016-10-11 DIAGNOSIS — J45909 Unspecified asthma, uncomplicated: Secondary | ICD-10-CM | POA: Insufficient documentation

## 2016-10-11 DIAGNOSIS — E669 Obesity, unspecified: Secondary | ICD-10-CM | POA: Diagnosis not present

## 2016-10-11 DIAGNOSIS — R51 Headache: Secondary | ICD-10-CM | POA: Insufficient documentation

## 2016-10-11 DIAGNOSIS — R1084 Generalized abdominal pain: Secondary | ICD-10-CM

## 2016-10-11 DIAGNOSIS — Z87891 Personal history of nicotine dependence: Secondary | ICD-10-CM | POA: Insufficient documentation

## 2016-10-11 LAB — URINALYSIS, ROUTINE W REFLEX MICROSCOPIC
BILIRUBIN URINE: NEGATIVE
Bacteria, UA: NONE SEEN
Glucose, UA: 50 mg/dL — AB
Hgb urine dipstick: NEGATIVE
KETONES UR: 20 mg/dL — AB
Nitrite: NEGATIVE
PROTEIN: 30 mg/dL — AB
SPECIFIC GRAVITY, URINE: 1.009 (ref 1.005–1.030)
pH: 7 (ref 5.0–8.0)

## 2016-10-11 LAB — CBC
HCT: 31.4 % — ABNORMAL LOW (ref 36.0–46.0)
Hemoglobin: 10.4 g/dL — ABNORMAL LOW (ref 12.0–15.0)
MCH: 20.8 pg — AB (ref 26.0–34.0)
MCHC: 33.1 g/dL (ref 30.0–36.0)
MCV: 62.7 fL — AB (ref 78.0–100.0)
PLATELETS: 476 10*3/uL — AB (ref 150–400)
RBC: 5.01 MIL/uL (ref 3.87–5.11)
RDW: 15.1 % (ref 11.5–15.5)
WBC: 8.3 10*3/uL (ref 4.0–10.5)

## 2016-10-11 LAB — LIPASE, BLOOD: LIPASE: 17 U/L (ref 11–51)

## 2016-10-11 LAB — COMPREHENSIVE METABOLIC PANEL
ALT: 247 U/L — AB (ref 14–54)
AST: 419 U/L — AB (ref 15–41)
Albumin: 3 g/dL — ABNORMAL LOW (ref 3.5–5.0)
Alkaline Phosphatase: 567 U/L — ABNORMAL HIGH (ref 38–126)
Anion gap: 13 (ref 5–15)
BUN: <5 mg/dL — ABNORMAL LOW (ref 6–20)
CO2: 23 mmol/L (ref 22–32)
CREATININE: 0.8 mg/dL (ref 0.44–1.00)
Calcium: 8.7 mg/dL — ABNORMAL LOW (ref 8.9–10.3)
Chloride: 99 mmol/L — ABNORMAL LOW (ref 101–111)
GFR calc non Af Amer: >60 mL/min (ref >60)
Glucose, Bld: 301 mg/dL — ABNORMAL HIGH (ref 65–99)
Potassium: 3.7 mmol/L (ref 3.5–5.1)
SODIUM: 135 mmol/L (ref 135–145)
Total Bilirubin: 2.5 mg/dL — ABNORMAL HIGH (ref 0.3–1.2)
Total Protein: 7.5 g/dL (ref 6.5–8.1)

## 2016-10-11 NOTE — ED Triage Notes (Signed)
Pt is c/o N/V/Constipation of which she states the N/V onset was this morning when she woke up. The constipation she adds that has is an ongoing problem that worsened in the last 2 days. Recently seen for UTI and reports taking her abx.

## 2016-10-11 NOTE — ED Provider Notes (Signed)
[facility] DEPT Provider Note   CSN: [id number] Arrival date & time: 10/11/16  2016     History   Chief Complaint Chief Complaint  Patient presents with  . N/V/Constipation    HPI Jaime Boone is a 76 y.o. female.  Patient presents to the emergency department with complaints of abdominal pain with nausea and vomiting. She reports that she is feeling constipated. This has been ongoing for some time, but now she is experiencing no vomiting. She reports diffuse abdominal pain that comes and goes. Patient was seen in the ER 4 days ago and noted to have elevated LFTs. She was going to follow-up with primary care about this but has not had a chance yet. Patient has not had any fever. She reports that she has not been able to hold anything down today because of the vomiting.      Past Medical History:  Diagnosis Date  . Acid reflux   . Arthritis   . Asthma   . Cirrhosis ([place])   . 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 Degenerative joint disease   . Diabetes mellitus without complication ([place])   . Hepatitis C   . Hypertension   . Sleep apnea     Patient Active Problem List   Diagnosis Date Noted  . Menopause 03/16/2016  . Keratosis 04/17/2015  . Vaginal lesion 03/14/2015  . Sebaceous cyst of labia 04/06/2012  . History of hepatitis C 04/06/2012  . Constipation 04/06/2012  . Postmenopausal 03/01/2012  . Vaginal atrophy 03/01/2012    Past Surgical History:  Procedure Laterality Date  . ABDOMINAL HYSTERECTOMY     TAH  . APPENDECTOMY    . DILATION AND CURETTAGE OF UTERUS    . EYE SURGERY     CATARACT  . FOOT SURGERY     BUNIONECTOMY  . GALLBLADDER SURGERY    . NOSE SURGERY      OB History    Gravida Para Term Preterm AB Living   6 3 3   3 3    SAB TAB Ectopic Multiple Live Births   3       3       Home Medications    Prior to Admission medications   Medication Sig Start Date End Date Taking? Authorizing Provider  BD  PEN NEEDLE NANO U/F 32G X 4 MM MISC  07/19/13  Yes [provider]  Calcium Carbonate-Vitamin D (CALTRATE 600+D) 600-400 MG-UNIT per tablet Take 1 tablet by mouth daily.   Yes [provider]  cetirizine (ZYRTEC) 10 MG tablet Take 10 mg by mouth daily after breakfast.   Yes [provider]  ciprofloxacin (CIPRO) 250 MG tablet Take 250 mg by mouth 2 (two) times daily. 10/05/16  Yes [provider]  fluticasone (FLONASE) 50 MCG/ACT nasal spray Place 2 sprays into both nostrils 2 (two) times daily.  04/22/13  Yes [provider]  glipiZIDE (GLUCOTROL) 10 MG tablet Take 5-10 mg by mouth daily before breakfast. 10 mg every morning and 5 mg every evening   Yes [provider]  insulin glargine (LANTUS) 100 UNIT/ML injection Inject 40 Units into the skin at bedtime.    Yes [provider]  levothyroxine (SYNTHROID, LEVOTHROID) 50 MCG tablet Take 50 mcg by mouth daily before breakfast.  04/27/13  Yes [provider]  losartan (COZAAR) 100 MG tablet Take 50 mg by mouth 2 (two) times daily.    Yes [provider]  montelukast (SINGULAIR)  10 MG tablet Take 10 mg by mouth daily after breakfast.    Yes [provider]  nitrofurantoin, macrocrystal-monohydrate, (MACROBID) 100 MG capsule Take 1 capsule (100 mg total) by mouth 2 (two) times daily. X 7 days 10/07/16  Yes Delo, Nathaneil Canary, MD  Omega-3 Fatty Acids (FISH OIL) 1000 MG CAPS Take 1,000 mg by mouth daily after breakfast.    Yes [provider]  ONE TOUCH ULTRA TEST test strip  04/03/13  Yes [provider]  pantoprazole (PROTONIX) 40 MG tablet Take 40 mg by mouth 2 (two) times daily.    Yes [provider]  Polyvinyl Alcohol-Povidone (MURINE TEARS FOR DRY EYES OP) Place 1 drop into both eyes daily as needed (dry eyes).    Yes [provider]  PROAIR HFA 108 (90 BASE) MCG/ACT inhaler Inhale 2 puffs into the lungs every 6 (six) hours as needed for  wheezing or shortness of breath.  04/19/13  Yes [provider]  triamcinolone cream (KENALOG) 0.1 % Apply 1 application topically daily as needed (skin irritation).  09/18/13  Yes [provider]  clobetasol cream (TEMOVATE) 9.56 % Apply 1 application topically 2 (two) times daily. Apply twice a week first week then once weekly as needed Patient not taking: Reported on 03/16/2016 06/05/15   Fontaine, Belinda Block, MD  promethazine (PHENERGAN) 25 MG tablet Take 1 tablet (25 mg total) by mouth every 6 (six) hours as needed for nausea. 10/07/16   Veryl Speak, MD    Family History Family History  Problem Relation Age of Onset  . Hypertension Mother   . Diabetes Mother   . Cancer Father        PROSTATE  . Hypertension Father   . Diabetes Father   . Heart disease Father   . Diabetes Sister   . Heart disease Brother   . Cancer Brother        PROSTATE  . Cancer Cousin        LUNG    Social History Social History  Substance Use Topics  . Smoking status: Former Smoker    Quit date: 03/01/1974  . Smokeless tobacco: Never Used  . Alcohol use No     Allergies   Aspirin; Codeine; and Penicillins   Review of Systems Review of Systems  Constitutional: Negative for fever.  Gastrointestinal: Positive for abdominal distention, abdominal pain, constipation, nausea and vomiting.     Physical Exam Updated Vital Signs BP 130/70   Pulse 90   Temp 98.4 F (36.9 C) (Oral)   Resp 18   SpO2 96%   Physical Exam  Constitutional: She is oriented to person, place, and time. She appears well-developed and well-nourished. No distress.  HENT:  Head: Normocephalic and atraumatic.  Right Ear: Hearing normal.  Left Ear: Hearing normal.  Nose: Nose normal.  Mouth/Throat: Oropharynx is clear and moist and mucous membranes are normal.  Eyes: Pupils are equal, round, and reactive to light. Conjunctivae and EOM are normal.  Neck: Normal range of motion. Neck supple.  Cardiovascular:  Regular rhythm, S1 normal and S2 normal.  Exam reveals no gallop and no friction rub.   No murmur heard. Pulmonary/Chest: Effort normal and breath sounds normal. No respiratory distress. She exhibits no tenderness.  Abdominal: Soft. Normal appearance. She exhibits distension. Bowel sounds are decreased. There is no hepatosplenomegaly. There is generalized tenderness. There is no rebound, no guarding, no tenderness at McBurney's point and negative Murphy's sign. No hernia.  Musculoskeletal: Normal range of motion.  Neurological: She is alert and oriented to person, place, and time. She has normal strength. No cranial nerve deficit or sensory deficit. Coordination normal. GCS eye subscore is 4. GCS verbal subscore is 5. GCS motor subscore is 6.  Skin: Skin is warm, dry and intact. No rash noted. No cyanosis.  Psychiatric: She has a normal mood and affect. Her speech is normal and behavior is normal. Thought content normal.  Nursing note and vitals reviewed.    ED Treatments / Results  Labs (all labs ordered are listed, but only abnormal results are displayed) Labs Reviewed  COMPREHENSIVE METABOLIC PANEL - Abnormal; Notable for the following:       Result Value   Chloride 99 (*)    Glucose, Bld 301 (*)    BUN <5 (*)    Calcium 8.7 (*)    Albumin 3.0 (*)    AST 419 (*)    ALT 247 (*)    Alkaline Phosphatase 567 (*)    Total Bilirubin 2.5 (*)    All other components within normal limits  CBC - Abnormal; Notable for the following:    Hemoglobin 10.4 (*)    HCT 31.4 (*)    MCV 62.7 (*)    MCH 20.8 (*)    Platelets 476 (*)    All other components within normal limits  URINALYSIS, ROUTINE W REFLEX MICROSCOPIC - Abnormal; Notable for the following:    Color, Urine AMBER (*)    Glucose, UA 50 (*)    Ketones, ur 20 (*)    Protein, ur 30 (*)    Leukocytes, UA TRACE (*)    Squamous Epithelial / LPF 0-5 (*)    All other components within normal limits  LIPASE, BLOOD  PROTIME-INR     EKG  EKG Interpretation None       Radiology Ct Abdomen Pelvis W Contrast  Result Date: 10/12/2016 CLINICAL DATA:  Nausea and vomiting x1 day with constipation. Appendectomy, cholecystectomy and hysterectomy history. Cirrhosis and chronic hepatitis-C. EXAM: CT ABDOMEN AND PELVIS WITH CONTRAST TECHNIQUE: Multidetector CT imaging of the abdomen and pelvis was performed using the standard protocol following bolus administration of intravenous contrast. CONTRAST:  100 cc Isovue-300 IV COMPARISON:  Abdominal MRI from 05/30/2009, 10/07/2016 ultrasound FINDINGS: Lower chest: Mild lower lobe bronchiectasis bilaterally with subsegmental atelectasis and/or scarring in the lingula and both lower lobes. Normal sized cardiac chambers. Small hiatal hernia. Hepatobiliary: Status post cholecystectomy. 9 mm hypodensity in the anterior aspect of the right hepatic lobe is stable and noted to be a hemangioma by MRI. No biliary dilatation. No choledocholithiasis. Pancreas: Normal Spleen: Normal Adrenals/Urinary Tract: Normal bilateral adrenal glands. Indeterminate cystic lesions of the left kidney involving the interpolar aspect with somewhat thickened and ill-defined septa measuring approximately 2.1 cm anteriorly and 2.3 cm along the lateral aspect of the kidney. Too small to further characterize hypodensities in the interpolar and lower pole of the right kidney on order of 4 mm or so. No nephrolithiasis. No obstructive uropathy. The urinary bladder is unremarkable. Stomach/Bowel: Contracted stomach with normal small bowel rotation. No small bowel dilatation, inflammation or obstruction. Status post appendectomy by report. A moderate amount of fecal retention is seen throughout large bowel. No large bowel wall mass or obstruction. Vascular/Lymphatic: Small porta hepatic lymph nodes the largest approximately 1 cm short axis with small subcentimeter celiac axis lymph nodes noted as well. No lymphadenopathy. Aortoiliac  atherosclerosis without aneurysm. Reproductive: Hysterectomy.  No adnexal mass. Other: No abdominal wall hernia or abnormality.  No abdominopelvic ascites. Musculoskeletal: Grade 1 anterolisthesis of L4 on L5 with degenerative disc disease and vacuum disc phenomenon most notably at T11-12 and L5-S1. Lesser degrees of disc space narrowing noted from L3 through L5. IMPRESSION: 1. Increased colonic stool burden consistent with constipation. No bowel obstruction or inflammation. 2. Indeterminate septated hypodense lesions of the interpolar left kidney on the order of 2.1 cm anteriorly and 2.3 cm laterally. Renal protocol MRI or CT without and contrast for more definitive classification of Bosniak criteria is recommended. 3. Bilateral lower lobe bronchiectasis. 4. Stable 9 mm right hepatic hypodense lesion consistent with a hemangioma by prior MRI. 5. Cholecystectomy, appendectomy and hysterectomy. 6. Aortoiliac atherosclerosis. 7. Thoracolumbar spondylosis. Electronically Signed   By: Ashley Royalty M.D.   On: 10/12/2016 00:48    Procedures Procedures (including critical care time)  Medications Ordered in ED Medications  iopamidol (ISOVUE-300) 61 % injection 100 mL (100 mLs Intravenous Contrast Given 10/12/16 0019)  ondansetron (ZOFRAN) injection 4 mg (4 mg Intravenous Given 10/12/16 0332)  morphine 2 MG/ML injection 2 mg (2 mg Intravenous Given 10/12/16 0332)     Initial Impression / Assessment and Plan / ED Course  I have reviewed the triage vital signs and the nursing notes.  Pertinent labs & imaging results that were available during my care of the patient were reviewed by me and considered in my medical decision making (see chart for details).     Patient presents to the emergency department for evaluation of nausea and vomiting. Patient was seen several days ago with abdominal discomfort and nausea, noted to have elevated liver function tests. She does have a history of hepatitis C and possible liver  cirrhosis. She is followed by [facility] and also has been seen at [facility] gastroenterology in the past. At time of discharge had previous visit she was told to follow-up with her GI specialist. In the last few days since that visit, however, symptoms have worsened. She feels abdominal fullness and constipation. CT scan today did confirm constipation. She was given a soapsuds enema with improvement of her pain, but still feels bloated and full. She is still not tolerating oral intake. Her LFTs have significantly elevated further. Acute hepatitis panel sent at previous visit is positive for HCV Ab > 11. Will admit patient for further management.  Final Clinical Impressions(s) / ED Diagnoses   Final diagnoses:  Generalized abdominal pain  Hepatitis    New Prescriptions New Prescriptions   No medications on file     Orpah Greek, MD 10/12/16 [phone]

## 2016-10-12 ENCOUNTER — Emergency Department (HOSPITAL_COMMUNITY): Payer: Medicare Other

## 2016-10-12 ENCOUNTER — Other Ambulatory Visit (HOSPITAL_COMMUNITY): Payer: Self-pay | Admitting: Radiology

## 2016-10-12 ENCOUNTER — Observation Stay (HOSPITAL_COMMUNITY): Payer: Medicare Other

## 2016-10-12 ENCOUNTER — Encounter (HOSPITAL_COMMUNITY): Payer: Self-pay

## 2016-10-12 DIAGNOSIS — R17 Unspecified jaundice: Secondary | ICD-10-CM | POA: Diagnosis not present

## 2016-10-12 DIAGNOSIS — R7989 Other specified abnormal findings of blood chemistry: Secondary | ICD-10-CM | POA: Diagnosis not present

## 2016-10-12 DIAGNOSIS — R1084 Generalized abdominal pain: Secondary | ICD-10-CM | POA: Diagnosis not present

## 2016-10-12 DIAGNOSIS — E119 Type 2 diabetes mellitus without complications: Secondary | ICD-10-CM

## 2016-10-12 DIAGNOSIS — R110 Nausea: Secondary | ICD-10-CM

## 2016-10-12 DIAGNOSIS — B179 Acute viral hepatitis, unspecified: Secondary | ICD-10-CM | POA: Diagnosis present

## 2016-10-12 DIAGNOSIS — K5900 Constipation, unspecified: Secondary | ICD-10-CM | POA: Diagnosis not present

## 2016-10-12 DIAGNOSIS — K7200 Acute and subacute hepatic failure without coma: Secondary | ICD-10-CM

## 2016-10-12 DIAGNOSIS — D649 Anemia, unspecified: Secondary | ICD-10-CM | POA: Diagnosis not present

## 2016-10-12 DIAGNOSIS — I10 Essential (primary) hypertension: Secondary | ICD-10-CM | POA: Diagnosis present

## 2016-10-12 DIAGNOSIS — G4730 Sleep apnea, unspecified: Secondary | ICD-10-CM | POA: Diagnosis present

## 2016-10-12 LAB — MAGNESIUM: MAGNESIUM: 1.5 mg/dL — AB (ref 1.7–2.4)

## 2016-10-12 LAB — CBC WITH DIFFERENTIAL/PLATELET
BASOS ABS: 0 10*3/uL (ref 0.0–0.1)
Basophils Relative: 0 %
EOS ABS: 0.2 10*3/uL (ref 0.0–0.7)
Eosinophils Relative: 2 %
HCT: 32.3 % — ABNORMAL LOW (ref 36.0–46.0)
Hemoglobin: 10.6 g/dL — ABNORMAL LOW (ref 12.0–15.0)
LYMPHS ABS: 1.4 10*3/uL (ref 0.7–4.0)
Lymphocytes Relative: 15 %
MCH: 20 pg — ABNORMAL LOW (ref 26.0–34.0)
MCHC: 32.8 g/dL (ref 30.0–36.0)
MCV: 61.1 fL — ABNORMAL LOW (ref 78.0–100.0)
MONO ABS: 0.6 10*3/uL (ref 0.1–1.0)
MONOS PCT: 7 %
Neutro Abs: 7 10*3/uL (ref 1.7–7.7)
Neutrophils Relative %: 76 %
PLATELETS: 480 10*3/uL — AB (ref 150–400)
RBC: 5.29 MIL/uL — AB (ref 3.87–5.11)
RDW: 15 % (ref 11.5–15.5)
WBC: 9.2 10*3/uL (ref 4.0–10.5)

## 2016-10-12 LAB — BASIC METABOLIC PANEL
Anion gap: 14 (ref 5–15)
CHLORIDE: 97 mmol/L — AB (ref 101–111)
CO2: 23 mmol/L (ref 22–32)
CREATININE: 0.72 mg/dL (ref 0.44–1.00)
Calcium: 8.8 mg/dL — ABNORMAL LOW (ref 8.9–10.3)
GFR calc Af Amer: >60 mL/min (ref >60)
GFR calc non Af Amer: >60 mL/min (ref >60)
Glucose, Bld: 296 mg/dL — ABNORMAL HIGH (ref 65–99)
Potassium: 3.4 mmol/L — ABNORMAL LOW (ref 3.5–5.1)
SODIUM: 134 mmol/L — AB (ref 135–145)

## 2016-10-12 LAB — HEPATIC FUNCTION PANEL
ALK PHOS: 587 U/L — AB (ref 38–126)
ALT: 265 U/L — ABNORMAL HIGH (ref 14–54)
AST: 411 U/L — ABNORMAL HIGH (ref 15–41)
Albumin: 2.9 g/dL — ABNORMAL LOW (ref 3.5–5.0)
BILIRUBIN DIRECT: 2.2 mg/dL — AB (ref 0.1–0.5)
BILIRUBIN INDIRECT: 1 mg/dL — AB (ref 0.3–0.9)
BILIRUBIN TOTAL: 3.2 mg/dL — AB (ref 0.3–1.2)
TOTAL PROTEIN: 7.4 g/dL (ref 6.5–8.1)

## 2016-10-12 LAB — MONONUCLEOSIS SCREEN: MONO SCREEN: NEGATIVE

## 2016-10-12 LAB — PROTIME-INR
INR: 1.03
Prothrombin Time: 13.5 seconds (ref 11.4–15.2)

## 2016-10-12 LAB — GLUCOSE, CAPILLARY
GLUCOSE-CAPILLARY: 243 mg/dL — AB (ref 65–99)
Glucose-Capillary: 277 mg/dL — ABNORMAL HIGH (ref 65–99)
Glucose-Capillary: 342 mg/dL — ABNORMAL HIGH (ref 65–99)
Glucose-Capillary: 314 mg/dL — ABNORMAL HIGH (ref 65–99)

## 2016-10-12 MED ORDER — ONDANSETRON HCL 4 MG/2ML IJ SOLN
4.0000 mg | Freq: Once | INTRAMUSCULAR | Status: AC
  Administered 2016-10-12: 4 mg via INTRAVENOUS
  Filled 2016-10-12: qty 2

## 2016-10-12 MED ORDER — ONDANSETRON HCL 4 MG/2ML IJ SOLN
4.0000 mg | Freq: Once | INTRAMUSCULAR | Status: DC
  Filled 2016-10-12: qty 2

## 2016-10-12 MED ORDER — SODIUM CHLORIDE 0.45 % IV BOLUS: 1000.0000 mL | Freq: Once | INTRAVENOUS | Status: DC

## 2016-10-12 MED ORDER — IOPAMIDOL (ISOVUE-300) INJECTION 61%
100.0000 mL | Freq: Once | INTRAVENOUS | Status: AC | PRN
  Administered 2016-10-12: 100 mL via INTRAVENOUS

## 2016-10-12 MED ORDER — MAGNESIUM SULFATE 2 GM/50ML IV SOLN
2.0000 g | Freq: Once | INTRAVENOUS | Status: AC
  Administered 2016-10-12: 2 g via INTRAVENOUS
  Filled 2016-10-12: qty 50

## 2016-10-12 MED ORDER — MORPHINE SULFATE (PF) 2 MG/ML IV SOLN
2.0000 mg | Freq: Once | INTRAVENOUS | Status: AC
  Administered 2016-10-12: 2 mg via INTRAVENOUS
  Filled 2016-10-12: qty 1

## 2016-10-12 MED ORDER — CIPROFLOXACIN IN D5W 400 MG/200ML IV SOLN: 400.0000 mg | Freq: Two times a day (BID) | INTRAVENOUS | Status: DC

## 2016-10-12 MED ORDER — GADOBENATE DIMEGLUMINE 529 MG/ML IV SOLN
19.0000 mL | Freq: Once | INTRAVENOUS | Status: AC | PRN
  Administered 2016-10-12: 19 mL via INTRAVENOUS

## 2016-10-12 MED ORDER — IOPAMIDOL (ISOVUE-300) INJECTION 61%
INTRAVENOUS | Status: AC
  Administered 2016-10-12: 100 mL via INTRAVENOUS
  Filled 2016-10-12: qty 100

## 2016-10-12 MED ORDER — INSULIN ASPART 100 UNIT/ML ~~LOC~~ SOLN
0.0000 [IU] | Freq: Three times a day (TID) | SUBCUTANEOUS | Status: DC
  Administered 2016-10-12: 7 [IU] via SUBCUTANEOUS
  Administered 2016-10-12: 5 [IU] via SUBCUTANEOUS
  Administered 2016-10-12: 3 [IU] via SUBCUTANEOUS
  Administered 2016-10-13: 9 [IU] via SUBCUTANEOUS
  Administered 2016-10-13: 5 [IU] via SUBCUTANEOUS
  Administered 2016-10-13: 7 [IU] via SUBCUTANEOUS
  Administered 2016-10-14: 5 [IU] via SUBCUTANEOUS
  Administered 2016-10-14: 7 [IU] via SUBCUTANEOUS

## 2016-10-12 MED ORDER — PANTOPRAZOLE SODIUM 40 MG IV SOLR
40.0000 mg | Freq: Every day | INTRAVENOUS | Status: DC
  Administered 2016-10-12 – 2016-10-14 (×2): 40 mg via INTRAVENOUS
  Filled 2016-10-12 (×2): qty 40

## 2016-10-12 NOTE — ED Notes (Signed)
Dr Olevia Bowens still at the bedside.

## 2016-10-12 NOTE — ED Notes (Signed)
Admitting MD at bedside.

## 2016-10-12 NOTE — H&P (Signed)
History and Physical    Jaime Boone WUX:[id number] DOB: 18-Aug-1940 DOA: 10/11/2016  PCP: Prince Solian, MD   Patient coming from: Home.  I have personally briefly reviewed patient's old medical records in [place]  Chief Complaint: Nausea, vomiting and constipation.  HPI: Jaime Boone is a 76 y.o. female with medical history significant of GERD, osteoarthritis, asthma, history of hepatitis C, liver cirrhosis, connective tissue disease questionable for rheumatoid arthritis, type 2 diabetes, hypertension, sleep apnea who is coming to the emergency department with complaints of nausea and vomiting since she woke up this morning and constipation for the past 2 days. She was seen in the ED 4 days ago with similar complaint of on and off crampy abdominal pain. She reports also being diagnosed and treated for an UTI as well, but has only taken 2-3 tablets of ciprofloxacin at home. She denies fever, chills,  ED Course: Initial vital signs temperature 98.35F, pulse 60, blood pressure 109/71 mmHg, respirations 14 and O2 sat 99% on room air. WBC 8.3, hemoglobin 10.4 g/dL and platelets 476. CMP shows glucose of 301 and total bilirubin of 2.5 mg/dL. AST was 419, ALT 247 and alkaline phosphatase 567. LFt's are increased when compared to 4 days ago. She was given an enema in the ED which resolved her pain and constipation.   CT scan abdomen/pelvis shows increased stool burden and left hypodense renal lesions among other findings. Please see images and full radiology report for further details.    Review of Systems: As per HPI otherwise 10 point review of systems negative.    Past Medical History:  Diagnosis Date  . Acid reflux   . Arthritis   . Asthma   . Cirrhosis ([place])   . 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 Degenerative joint disease   . Diabetes mellitus without complication ([place])   . Hepatitis C   . Hypertension   . Sleep apnea     Past  Surgical History:  Procedure Laterality Date  . ABDOMINAL HYSTERECTOMY     TAH  . APPENDECTOMY    . DILATION AND CURETTAGE OF UTERUS    . EYE SURGERY     CATARACT  . FOOT SURGERY     BUNIONECTOMY  . GALLBLADDER SURGERY    . NOSE SURGERY       reports that she quit smoking about 42 years ago. She has never used smokeless tobacco. She reports that she does not drink alcohol or use drugs.  Allergies  Allergen Reactions  . Aspirin Other (See Comments)    Stomach pain  . Codeine Nausea And Vomiting  . Penicillins Swelling    THROAT Has patient had a PCN reaction causing immediate rash, facial/tongue/throat swelling, SOB or lightheadedness with hypotension: yes Has patient had a PCN reaction causing severe rash involving mucus membranes or skin necrosis: no Has patient had a PCN reaction that required hospitalization: no Has patient had a PCN reaction occurring within the last 10 years: no If all of the above answers are "NO", then may proceed with Cephalosporin use.     Family History  Problem Relation Age of Onset  . Hypertension Mother   . Diabetes Mother   . Cancer Father        PROSTATE  . Hypertension Father   . Diabetes Father   . Heart disease Father   . Diabetes Sister   . Heart disease Brother   . Cancer Brother  PROSTATE  . Cancer Cousin        LUNG    Prior to Admission medications   Medication Sig Start Date End Date Taking? Authorizing Provider  BD PEN NEEDLE NANO U/F 32G X 4 MM MISC  07/19/13  Yes [provider]  Calcium Carbonate-Vitamin D (CALTRATE 600+D) 600-400 MG-UNIT per tablet Take 1 tablet by mouth daily.   Yes [provider]  cetirizine (ZYRTEC) 10 MG tablet Take 10 mg by mouth daily after breakfast.   Yes [provider]  ciprofloxacin (CIPRO) 250 MG tablet Take 250 mg by mouth 2 (two) times daily. 10/05/16  Yes [provider]  fluticasone (FLONASE) 50 MCG/ACT nasal spray Place 2 sprays into both  nostrils 2 (two) times daily.  04/22/13  Yes [provider]  glipiZIDE (GLUCOTROL) 10 MG tablet Take 5-10 mg by mouth daily before breakfast. 10 mg every morning and 5 mg every evening   Yes [provider]  insulin glargine (LANTUS) 100 UNIT/ML injection Inject 40 Units into the skin at bedtime.    Yes [provider]  levothyroxine (SYNTHROID, LEVOTHROID) 50 MCG tablet Take 50 mcg by mouth daily before breakfast.  04/27/13  Yes [provider]  losartan (COZAAR) 100 MG tablet Take 50 mg by mouth 2 (two) times daily.    Yes [provider]  montelukast (SINGULAIR) 10 MG tablet Take 10 mg by mouth daily after breakfast.    Yes [provider]  nitrofurantoin, macrocrystal-monohydrate, (MACROBID) 100 MG capsule Take 1 capsule (100 mg total) by mouth 2 (two) times daily. X 7 days 10/07/16  Yes Delo, Nathaneil Canary, MD  Omega-3 Fatty Acids (FISH OIL) 1000 MG CAPS Take 1,000 mg by mouth daily after breakfast.    Yes [provider]  ONE TOUCH ULTRA TEST test strip  04/03/13  Yes [provider]  pantoprazole (PROTONIX) 40 MG tablet Take 40 mg by mouth 2 (two) times daily.    Yes [provider]  Polyvinyl Alcohol-Povidone (MURINE TEARS FOR DRY EYES OP) Place 1 drop into both eyes daily as needed (dry eyes).    Yes [provider]  PROAIR HFA 108 (90 BASE) MCG/ACT inhaler Inhale 2 puffs into the lungs every 6 (six) hours as needed for wheezing or shortness of breath.  04/19/13  Yes [provider]  triamcinolone cream (KENALOG) 0.1 % Apply 1 application topically daily as needed (skin irritation).  09/18/13  Yes [provider]  clobetasol cream (TEMOVATE) 7.06 % Apply 1 application topically 2 (two) times daily. Apply twice a week first week then once weekly as needed Patient not taking: Reported on 03/16/2016 06/05/15   Fontaine, Belinda Block, MD  promethazine (PHENERGAN) 25 MG tablet Take 1 tablet (25 mg total) by  mouth every 6 (six) hours as needed for nausea. 10/07/16   Veryl Speak, MD    Physical Exam: Vitals:   10/11/16 2024 10/12/16 0107 10/12/16 0335  BP: 113/69 (!) 154/87 130/70  Pulse: 86 72 90  Resp: 20 20 18   Temp: 98.2 F (36.8 C) 98.4 F (36.9 C)   TempSrc: Oral Oral   SpO2: 96% 100% 96%    Constitutional: NAD, calm, comfortable Eyes: PERRL, positive scleral icterus, lids and conjunctivae normal ENMT: Mucous membranes are moist. Posterior pharynx clear of any exudate or lesions. Neck: normal, supple, no masses, no thyromegaly Respiratory: Clear to auscultation bilaterally, no wheezing, no crackles. Normal respiratory effort. No accessory muscle use.  Cardiovascular: Regular rate and rhythm, no  murmurs / rubs / gallops. No extremity edema. 2+ pedal pulses. No carotid bruits.  Abdomen: Soft, mild diffuse tenderness, no guarding/rebound/masses palpated. No hepatosplenomegaly. Bowel sounds positive.  Musculoskeletal: no clubbing / cyanosis. Good ROM, no contractures. Normal muscle tone.  Skin: no rashes, lesions, ulcers on limited skin exam. Neurologic: CN 2-12 grossly intact. Sensation intact, DTR normal. Strength 5/5 in all 4.  Psychiatric: Normal judgment and insight. Alert and oriented x 4. Normal mood.    Labs on Admission: I have personally reviewed following labs and imaging studies  CBC:  Recent Labs Lab 10/07/16 1035 10/11/16 2039  WBC 10.9* 8.3  HGB 10.8* 10.4*  HCT 34.1* 31.4*  MCV 64.1* 62.7*  PLT 368 671*   Basic Metabolic Panel:  Recent Labs Lab 10/07/16 1035 10/11/16 2039  NA 127* 135  K 3.7 3.7  CL 98* 99*  CO2 19* 23  GLUCOSE 373* 301*  BUN 8 <5*  CREATININE 1.31* 0.80  CALCIUM 9.0 8.7*   GFR: CrCl cannot be calculated (Unknown ideal weight.). Liver Function Tests:  Recent Labs Lab 10/07/16 1035 10/11/16 2039  AST 61* 419*  ALT 135* 247*  ALKPHOS 308* 567*  BILITOT 1.0 2.5*  PROT 7.5 7.5  ALBUMIN 2.9* 3.0*    Recent Labs Lab  10/11/16 2039  LIPASE 17   No results for input(s): AMMONIA in the last 168 hours. Coagulation Profile:  Recent Labs Lab 10/12/16 0400  INR 1.03   Cardiac Enzymes: No results for input(s): CKTOTAL, CKMB, CKMBINDEX, TROPONINI in the last 168 hours. BNP (last 3 results) No results for input(s): PROBNP in the last 8760 hours. HbA1C: No results for input(s): HGBA1C in the last 72 hours. CBG: No results for input(s): GLUCAP in the last 168 hours. Lipid Profile: No results for input(s): CHOL, HDL, LDLCALC, TRIG, CHOLHDL, LDLDIRECT in the last 72 hours. Thyroid Function Tests: No results for input(s): TSH, T4TOTAL, FREET4, T3FREE, THYROIDAB in the last 72 hours. Anemia Panel: No results for input(s): VITAMINB12, FOLATE, FERRITIN, TIBC, IRON, RETICCTPCT in the last 72 hours. Urine analysis:    Component Value Date/Time   COLORURINE AMBER (A) 10/11/2016 2042   APPEARANCEUR CLEAR 10/11/2016 2042   LABSPEC 1.009 10/11/2016 2042   PHURINE 7.0 10/11/2016 2042   GLUCOSEU 50 (A) 10/11/2016 2042   HGBUR NEGATIVE 10/11/2016 2042   BILIRUBINUR NEGATIVE 10/11/2016 2042   KETONESUR 20 (A) 10/11/2016 2042   PROTEINUR 30 (A) 10/11/2016 2042   NITRITE NEGATIVE 10/11/2016 2042   LEUKOCYTESUR TRACE (A) 10/11/2016 2042    Radiological Exams on Admission: Ct Abdomen Pelvis W Contrast  Result Date: 10/12/2016 CLINICAL DATA:  Nausea and vomiting x1 day with constipation. Appendectomy, cholecystectomy and hysterectomy history. Cirrhosis and chronic hepatitis-C. EXAM: CT ABDOMEN AND PELVIS WITH CONTRAST TECHNIQUE: Multidetector CT imaging of the abdomen and pelvis was performed using the standard protocol following bolus administration of intravenous contrast. CONTRAST:  100 cc Isovue-300 IV COMPARISON:  Abdominal MRI from 05/30/2009, 10/07/2016 ultrasound FINDINGS: Lower chest: Mild lower lobe bronchiectasis bilaterally with subsegmental atelectasis and/or scarring in the lingula and both lower lobes.  Normal sized cardiac chambers. Small hiatal hernia. Hepatobiliary: Status post cholecystectomy. 9 mm hypodensity in the anterior aspect of the right hepatic lobe is stable and noted to be a hemangioma by MRI. No biliary dilatation. No choledocholithiasis. Pancreas: Normal Spleen: Normal Adrenals/Urinary Tract: Normal bilateral adrenal glands. Indeterminate cystic lesions of the left kidney involving the interpolar aspect with somewhat thickened and ill-defined septa measuring approximately 2.1 cm anteriorly and  2.3 cm along the lateral aspect of the kidney. Too small to further characterize hypodensities in the interpolar and lower pole of the right kidney on order of 4 mm or so. No nephrolithiasis. No obstructive uropathy. The urinary bladder is unremarkable. Stomach/Bowel: Contracted stomach with normal small bowel rotation. No small bowel dilatation, inflammation or obstruction. Status post appendectomy by report. A moderate amount of fecal retention is seen throughout large bowel. No large bowel wall mass or obstruction. Vascular/Lymphatic: Small porta hepatic lymph nodes the largest approximately 1 cm short axis with small subcentimeter celiac axis lymph nodes noted as well. No lymphadenopathy. Aortoiliac atherosclerosis without aneurysm. Reproductive: Hysterectomy.  No adnexal mass. Other: No abdominal wall hernia or abnormality. No abdominopelvic ascites. Musculoskeletal: Grade 1 anterolisthesis of L4 on L5 with degenerative disc disease and vacuum disc phenomenon most notably at T11-12 and L5-S1. Lesser degrees of disc space narrowing noted from L3 through L5. IMPRESSION: 1. Increased colonic stool burden consistent with constipation. No bowel obstruction or inflammation. 2. Indeterminate septated hypodense lesions of the interpolar left kidney on the order of 2.1 cm anteriorly and 2.3 cm laterally. Renal protocol MRI or CT without and contrast for more definitive classification of Bosniak criteria is  recommended. 3. Bilateral lower lobe bronchiectasis. 4. Stable 9 mm right hepatic hypodense lesion consistent with a hemangioma by prior MRI. 5. Cholecystectomy, appendectomy and hysterectomy. 6. Aortoiliac atherosclerosis. 7. Thoracolumbar spondylosis. Electronically Signed   By: Ashley Royalty M.D.   On: 10/12/2016 00:48    EKG: Independently reviewed.   Assessment/Plan Principal Problem:   Acute hepatitis Observation/MedSurg. Keep nothing by mouth until further imaging done. Continue IV fluids. Analgesics and antiemetics as needed. Check acute hepatitis panel. Follow up liver function tests later today.  Active Problems:   History of hepatitis C Per patient, she was treated with Harvoni several years ago.    Constipation  Resolved    Hypertension Continue losartan 50 mg by mouth twice a day. Monitor blood pressure, renal function and electrolytes.    Sleep apnea Not on CPAP.    Type 2 diabetes mellitus (HCC) Currently nothing by mouth. Did not have Lantus yesterday evening. CBG monitoring with sensitive RI sliding scale. Resume carbohydrate modified diet once MRI done.    Abnormal CT abdomen results. Check MRI as recommended by radiology.    DVT prophylaxis: SCDs. Code Status: Full code. Family Communication:  Disposition Plan: Admit for further evaluation of abnormal LFTs. Consults called:  Admission status: Observation/telemetry.   Reubin Milan MD [facility] Pager [phone].  If 7PM-7AM, please contact night-coverage [email or web address] Password [email or web address]  10/12/2016, 5:01 AM

## 2016-10-12 NOTE — Consult Note (Signed)
Consultation  Referring Provider: Dr. Patrecia Pour     Primary Care Physician:  Prince Solian, MD Primary Gastroenterologist:  Dr. Henrene Pastor    Reason for Consultation: Elevated LFT's, Abdominal Pain             HPI:   Jaime Boone is a 76 y.o. female past medical history of cirrhosis and hepatitis C, as well as others listed below, who presented to the ER late yesterday evening with a complaint of nausea and vomiting since she had workup morning and constipation for 2 days. Of note patient had been seen in the ED 4 days ago with a similar complaint of off and on crampy abdominal pain. She had been diagnosed with UTI at that time but had only taken it 2-3 tablets of ciprofloxacin at home.   Today, the patient tells me that she was feeling well until about a month ago when she started to feel "low energy" as well as low appetite and drowsy. She then began with some "vaginal itching", and went to "the clinic", her first visit was there was 10/03/16 and she was prescribed Nitrofurantoin. The patient tells her she took this for 2 days, dosed twice daily and began to feel as though "all my organs were swelling", she stopped this medication and called the clinic and was started on Ciprofloxacin 250 mg twice a day on 10/05/16. Again the patient began with these feelings of her "organs swelling", and proceeded to the ER on 10/07/16. Again she was noted to have a UTI as above and continued on Ciprofloxacin. Patient tells me she again took this for about 2 days but started to feel worse and came to the ER yesterday.   Patient tells me she regularly follows with the liver clinic in town and her last visit was a 04/09/16. At that time her liver labs were normal and an ultrasound showed likely fatty liver, this was discussed at that office visit. She has previously been treated for her hepatitis C and has been "negative for years". Patient does tell me she knows she is supposed to have an endoscopy but is somewhat  scared of this procedure and has been avoiding it. Currently patient tells me she does have some right upper quadrant abdominal pain which she rates as a 2/10 and continues to feel somewhat fatigued and "sick".    She does report having a bowel movement yesterday after enema administration on the hospital.   Patient denies fever, chills, blood in her stool, melena, weight loss or abdominal distention.  ED Course: Initial vital signs temperature 98.45F, pulse 60, blood pressure 109/71 mmHg, respirations 14 and O2 sat 99% on room air. WBC 8.3, hemoglobin 10.4 g/dL and platelets 476. CMP shows glucose of 301 and total bilirubin of 2.5 mg/dL. AST was 419, ALT 247 and alkaline phosphatase 567. LFt's are increased when compared to 4 days ago. She was given an enema in the ED which resolved her pain and constipation.   CT scan abdomen/pelvis shows increased stool burden and left hypodense renal lesions among other findings. Please see images and full radiology report for further details.  Previous GI History: 04/09/16-liver clinic: Labs show a bilirubin of 0.6, alkaline phosphatase 78, AST 21 and ALT 20 mL (see that note in media 04/29/16 for further discussion) 05/12/13-office visit, Dr. Henrene Pastor: Patient was noted to have a history of hepatitis C with possible cirrhosis and was seen that day regarding recent abdominal pain and the need for surveillance endoscopy (  screening for varices), hypertension or melena from [facility] around 2009; plan: Scheduled for surveillance upper endoscopy and told to continue to follow with [facility] for her liver care  Past Medical History:  Diagnosis Date  . Acid reflux   . Arthritis   . Asthma   . Cirrhosis ([place])   . 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 Degenerative joint disease   . Diabetes mellitus without complication ([place])   . Hepatitis C   . Hypertension   . Sleep apnea     Past Surgical History:    Procedure Laterality Date  . ABDOMINAL HYSTERECTOMY     TAH  . APPENDECTOMY    . DILATION AND CURETTAGE OF UTERUS    . EYE SURGERY     CATARACT  . FOOT SURGERY     BUNIONECTOMY  . GALLBLADDER SURGERY    . NOSE SURGERY      Family History  Problem Relation Age of Onset  . Hypertension Mother   . Diabetes Mother   . Cancer Father        PROSTATE  . Hypertension Father   . Diabetes Father   . Heart disease Father   . Diabetes Sister   . Heart disease Brother   . Cancer Brother        PROSTATE  . Cancer Cousin        LUNG     Social History  Substance Use Topics  . Smoking status: Former Smoker    Quit date: 03/01/1974  . Smokeless tobacco: Never Used  . Alcohol use No    Prior to Admission medications   Medication Sig Start Date End Date Taking? Authorizing Provider  BD PEN NEEDLE NANO U/F 32G X 4 MM MISC  07/19/13  Yes [provider]  Calcium Carbonate-Vitamin D (CALTRATE 600+D) 600-400 MG-UNIT per tablet Take 1 tablet by mouth daily.   Yes [provider]  cetirizine (ZYRTEC) 10 MG tablet Take 10 mg by mouth daily after breakfast.   Yes [provider]  ciprofloxacin (CIPRO) 250 MG tablet Take 250 mg by mouth 2 (two) times daily. 10/05/16  Yes [provider]  fluticasone (FLONASE) 50 MCG/ACT nasal spray Place 2 sprays into both nostrils 2 (two) times daily.  04/22/13  Yes [provider]  glipiZIDE (GLUCOTROL) 10 MG tablet Take 5-10 mg by mouth daily before breakfast. 10 mg every morning and 5 mg every evening   Yes [provider]  insulin glargine (LANTUS) 100 UNIT/ML injection Inject 40 Units into the skin at bedtime.    Yes [provider]  levothyroxine (SYNTHROID, LEVOTHROID) 50 MCG tablet Take 50 mcg by mouth daily before breakfast.  04/27/13  Yes [provider]  losartan (COZAAR) 100 MG tablet Take 50 mg by mouth 2 (two) times daily.    Yes [provider]  montelukast (SINGULAIR)  10 MG tablet Take 10 mg by mouth daily after breakfast.    Yes [provider]  nitrofurantoin, macrocrystal-monohydrate, (MACROBID) 100 MG capsule Take 1 capsule (100 mg total) by mouth 2 (two) times daily. X 7 days 10/07/16  Yes Delo, Nathaneil Canary, MD  Omega-3 Fatty Acids (FISH OIL) 1000 MG CAPS Take 1,000 mg by mouth daily after breakfast.    Yes [provider]  ONE TOUCH ULTRA TEST test strip  04/03/13  Yes [provider]  pantoprazole (PROTONIX) 40 MG tablet Take 40 mg by mouth 2 (two) times daily.  Yes [provider]  Polyvinyl Alcohol-Povidone (MURINE TEARS FOR DRY EYES OP) Place 1 drop into both eyes daily as needed (dry eyes).    Yes [provider]  PROAIR HFA 108 (90 BASE) MCG/ACT inhaler Inhale 2 puffs into the lungs every 6 (six) hours as needed for wheezing or shortness of breath.  04/19/13  Yes [provider]  triamcinolone cream (KENALOG) 0.1 % Apply 1 application topically daily as needed (skin irritation).  09/18/13  Yes [provider]  clobetasol cream (TEMOVATE) 2.83 % Apply 1 application topically 2 (two) times daily. Apply twice a week first week then once weekly as needed Patient not taking: Reported on 03/16/2016 06/05/15   Fontaine, Belinda Block, MD  promethazine (PHENERGAN) 25 MG tablet Take 1 tablet (25 mg total) by mouth every 6 (six) hours as needed for nausea. 10/07/16   Veryl Speak, MD    Current Facility-Administered Medications  Medication Dose Route Frequency Provider Last Rate Last Dose  . ciprofloxacin (CIPRO) IVPB 400 mg  400 mg Intravenous Q12H Reubin Milan, MD      . insulin aspart (novoLOG) injection 0-9 Units  0-9 Units Subcutaneous TID WC Reubin Milan, MD   5 Units at 10/12/16 [phone]  . magnesium sulfate IVPB 2 g 50 mL  2 g Intravenous Once Reubin Milan, MD   Stopped at 10/12/16 1012  . ondansetron (ZOFRAN) injection 4 mg  4 mg Intravenous Once Reubin Milan, MD   Stopped at  10/12/16 [phone]  . pantoprazole (PROTONIX) injection 40 mg  40 mg Intravenous Daily Reubin Milan, MD   40 mg at 10/12/16 4650  . sodium chloride 0.45 % bolus 1,000 mL  1,000 mL Intravenous Once Reubin Milan, MD        Allergies as of 10/11/2016 - Review Complete 10/11/2016  Allergen Reaction Noted  . Aspirin Other (See Comments) 01/09/2012  . Codeine Nausea And Vomiting 01/09/2012  . Penicillins Swelling 01/09/2012     Review of Systems:    Constitutional: No weight loss, fever or chills Skin: No rash Cardiovascular: No chest pain Respiratory: No SOB  Gastrointestinal: See HPI and otherwise negative Genitourinary: No dysuria Neurological: No headache Musculoskeletal: No new muscle or joint pain Hematologic: No bleeding or bruising Psychiatric: No history of depression or anxiety   Physical Exam:  Vital signs in last 24 hours: Temp:  [98 F (36.7 C)-98.4 F (36.9 C)] 98 F (36.7 C) (07/16 0650) Pulse Rate:  [72-90] 73 (07/16 0650) Resp:  [14-20] 14 (07/16 0650) BP: (113-154)/(69-87) 124/76 (07/16 0650) SpO2:  [96 %-100 %] 100 % (07/16 0650)   General:   Pleasant elderly African American female appears to be in NAD, Well developed, Well nourished, alert and cooperative Head:  Normocephalic and atraumatic. Eyes:   PEERL, EOMI. No icterus. Conjunctiva pink. Ears:  Normal auditory acuity. Neck:  Supple Throat: Oral cavity and pharynx without inflammation, swelling or lesion.  Lungs: Respirations even and unlabored. Lungs clear to auscultation bilaterally.   No wheezes, crackles, or rhonchi.  Heart: Normal S1, S2. No MRG. Regular rate and rhythm. No peripheral edema, cyanosis or pallor.  Abdomen:  Soft, nondistended, mild RUQ ttp No rebound or guarding. Normal bowel sounds. No appreciable masses or hepatomegaly. Rectal:  Not performed.  Msk:  Symmetrical without gross deformities.  Extremities:  Without edema, no deformity or joint abnormality.  Neurologic:   Alert and  oriented x4;  grossly normal neurologically. Skin:   Dry and intact  without significant lesions or rashes. Psychiatric:  Demonstrates good judgement and reason without abnormal affect or behaviors.   LAB RESULTS:  Recent Labs  10/11/16 2039 10/12/16 0456  WBC 8.3 9.2  HGB 10.4* 10.6*  HCT 31.4* 32.3*  PLT 476* 480*   BMET  Recent Labs  10/11/16 2039 10/12/16 0456  NA 135 134*  K 3.7 3.4*  CL 99* 97*  CO2 23 23  GLUCOSE 301* 296*  BUN <5* <5*  CREATININE 0.80 0.72  CALCIUM 8.7* 8.8*   LFT  Recent Labs  10/12/16 0456  PROT 7.4  ALBUMIN 2.9*  AST 411*  ALT 265*  ALKPHOS 587*  BILITOT 3.2*  BILIDIR 2.2*  IBILI 1.0*   PT/INR  Recent Labs  10/12/16 0400  LABPROT 13.5  INR 1.03    STUDIES: Ct Abdomen Pelvis W Contrast  Result Date: 10/12/2016 CLINICAL DATA:  Nausea and vomiting x1 day with constipation. Appendectomy, cholecystectomy and hysterectomy history. Cirrhosis and chronic hepatitis-C. EXAM: CT ABDOMEN AND PELVIS WITH CONTRAST TECHNIQUE: Multidetector CT imaging of the abdomen and pelvis was performed using the standard protocol following bolus administration of intravenous contrast. CONTRAST:  100 cc Isovue-300 IV COMPARISON:  Abdominal MRI from 05/30/2009, 10/07/2016 ultrasound FINDINGS: Lower chest: Mild lower lobe bronchiectasis bilaterally with subsegmental atelectasis and/or scarring in the lingula and both lower lobes. Normal sized cardiac chambers. Small hiatal hernia. Hepatobiliary: Status post cholecystectomy. 9 mm hypodensity in the anterior aspect of the right hepatic lobe is stable and noted to be a hemangioma by MRI. No biliary dilatation. No choledocholithiasis. Pancreas: Normal Spleen: Normal Adrenals/Urinary Tract: Normal bilateral adrenal glands. Indeterminate cystic lesions of the left kidney involving the interpolar aspect with somewhat thickened and ill-defined septa measuring approximately 2.1 cm anteriorly and 2.3 cm along the  lateral aspect of the kidney. Too small to further characterize hypodensities in the interpolar and lower pole of the right kidney on order of 4 mm or so. No nephrolithiasis. No obstructive uropathy. The urinary bladder is unremarkable. Stomach/Bowel: Contracted stomach with normal small bowel rotation. No small bowel dilatation, inflammation or obstruction. Status post appendectomy by report. A moderate amount of fecal retention is seen throughout large bowel. No large bowel wall mass or obstruction. Vascular/Lymphatic: Small porta hepatic lymph nodes the largest approximately 1 cm short axis with small subcentimeter celiac axis lymph nodes noted as well. No lymphadenopathy. Aortoiliac atherosclerosis without aneurysm. Reproductive: Hysterectomy.  No adnexal mass. Other: No abdominal wall hernia or abnormality. No abdominopelvic ascites. Musculoskeletal: Grade 1 anterolisthesis of L4 on L5 with degenerative disc disease and vacuum disc phenomenon most notably at T11-12 and L5-S1. Lesser degrees of disc space narrowing noted from L3 through L5. IMPRESSION: 1. Increased colonic stool burden consistent with constipation. No bowel obstruction or inflammation. 2. Indeterminate septated hypodense lesions of the interpolar left kidney on the order of 2.1 cm anteriorly and 2.3 cm laterally. Renal protocol MRI or CT without and contrast for more definitive classification of Bosniak criteria is recommended. 3. Bilateral lower lobe bronchiectasis. 4. Stable 9 mm right hepatic hypodense lesion consistent with a hemangioma by prior MRI. 5. Cholecystectomy, appendectomy and hysterectomy. 6. Aortoiliac atherosclerosis. 7. Thoracolumbar spondylosis. Electronically Signed   By: Ashley Royalty M.D.   On: 10/12/2016 00:48   ULTRASOUND ABDOMEN LIMITED RIGHT UPPER QUADRANT 10/07/16  COMPARISON:  None.  FINDINGS: Gallbladder:  Post cholecystectomy.  Common bile duct:  Diameter: 7.2 mm  Liver:  No focal lesion  identified. Within normal limits in parenchymal echogenicity. Normal direction  of flow in main portal vein.  IMPRESSION: Cholecystectomy.  No acute process identified.   Electronically Signed   By: Kristine Garbe M.D.   On: 10/07/2016 13:41  PREVIOUS ENDOSCOPIES:            See HPI   Impression / Plan:   Impression: 1. Elevated LFT's: 04/09/16- AST 21, ALT 20, Alk phos 78, Tbili 0.6; 7/11/8- AST 61, ALT 135, Alk phos 308, Tot bili 1.0; 10/11/16- AST 419, ALT 247, Alk phos 567, tot bili 2.5; 10/12/16-AST 411, ALT 265, Alk phos 587, Tot bili 3.2- Increasing over the past 5 days, recent abx for UTI-Nitrofurantoin 10/03/16-10/05/16 then Ciprofloxacin 10/05/16-present; Consider relation to recent abx  vs other autoimmune cause 2. Constipation: seen on recent CT, resolved after enema yesterday 3. H/o Hepatitis C: most recent hepatitis panel 10/07/16-hep c >11.0, all others negative, prior tx noted in liver clinic notes with cure 4. Anemia: hgb 10.6  Plan: 1. MRI abdomen has been ordered by hospitalist for eval of renal abnormalities 2. Continue supportive measures 3. We will stop Ciprofloxacin for now-will need to consider alternate abx therapy as this may be causing DILI 4. Ordered labs to include daily repeat LFT's and autoimmune hepatitis labs as well as HCV RNA, monospot and CMV 5. Please await any further recommendations from Dr. Havery Moros later today  Thank you for your kind consultation, we will continue to follow.  Lavone Nian Anette Barra  10/12/2016, 10:04 AM Pager #: [phone]

## 2016-10-12 NOTE — Progress Notes (Signed)
[facility]   Patient admitted after midnight see H&P for further details  Patient is a 76 year old female with medical history significant for hepatitis C, asthma, GERD, type 2 diabetes and hypertension who presented to emergency department complaining of nausea, vomiting and constipation. Patient has been admitted for lab findings of elevated LFTs and GI evaluation.  Chart has been reviewed, I have evaluated the patient and lab has been ordered.  Plan: We'll discontinue Cipro not really evidence of UTI infection or any other infection. Await hepatitis panel and autoimmune serologies GI recommendations appreciated MRI pending for kidney lesion.  Rest as per H&P   Chipper Oman, MD

## 2016-10-13 DIAGNOSIS — R935 Abnormal findings on diagnostic imaging of other abdominal regions, including retroperitoneum: Secondary | ICD-10-CM | POA: Diagnosis not present

## 2016-10-13 DIAGNOSIS — D649 Anemia, unspecified: Secondary | ICD-10-CM

## 2016-10-13 DIAGNOSIS — Z8619 Personal history of other infectious and parasitic diseases: Secondary | ICD-10-CM | POA: Diagnosis not present

## 2016-10-13 DIAGNOSIS — K5900 Constipation, unspecified: Secondary | ICD-10-CM | POA: Diagnosis not present

## 2016-10-13 DIAGNOSIS — R7989 Other specified abnormal findings of blood chemistry: Secondary | ICD-10-CM | POA: Diagnosis not present

## 2016-10-13 DIAGNOSIS — B179 Acute viral hepatitis, unspecified: Secondary | ICD-10-CM | POA: Diagnosis not present

## 2016-10-13 LAB — GLUCOSE, CAPILLARY
GLUCOSE-CAPILLARY: 293 mg/dL — AB (ref 65–99)
GLUCOSE-CAPILLARY: 357 mg/dL — AB (ref 65–99)
GLUCOSE-CAPILLARY: 283 mg/dL — AB (ref 65–99)
Glucose-Capillary: 337 mg/dL — ABNORMAL HIGH (ref 65–99)

## 2016-10-13 LAB — CBC WITH DIFFERENTIAL/PLATELET
Basophils Absolute: 0 10*3/uL (ref 0.0–0.1)
Basophils Relative: 0 %
EOS ABS: 0.3 10*3/uL (ref 0.0–0.7)
Eosinophils Relative: 4 %
HCT: 31.1 % — ABNORMAL LOW (ref 36.0–46.0)
Hemoglobin: 10.3 g/dL — ABNORMAL LOW (ref 12.0–15.0)
Lymphocytes Relative: 31 %
Lymphs Abs: 2.1 10*3/uL (ref 0.7–4.0)
MCH: 20.8 pg — AB (ref 26.0–34.0)
MCHC: 33.1 g/dL (ref 30.0–36.0)
MCV: 62.7 fL — ABNORMAL LOW (ref 78.0–100.0)
MONO ABS: 0.4 10*3/uL (ref 0.1–1.0)
Monocytes Relative: 6 %
NEUTROS PCT: 59 %
Neutro Abs: 4 10*3/uL (ref 1.7–7.7)
PLATELETS: 497 10*3/uL — AB (ref 150–400)
RBC: 4.96 MIL/uL (ref 3.87–5.11)
RDW: 15.4 % (ref 11.5–15.5)
WBC: 6.8 10*3/uL (ref 4.0–10.5)

## 2016-10-13 LAB — COMPREHENSIVE METABOLIC PANEL
ALK PHOS: 567 U/L — AB (ref 38–126)
ALT: 241 U/L — AB (ref 14–54)
AST: 237 U/L — ABNORMAL HIGH (ref 15–41)
Albumin: 2.9 g/dL — ABNORMAL LOW (ref 3.5–5.0)
Anion gap: 10 (ref 5–15)
BUN: <5 mg/dL — ABNORMAL LOW (ref 6–20)
CALCIUM: 8.8 mg/dL — AB (ref 8.9–10.3)
CO2: 24 mmol/L (ref 22–32)
CREATININE: 0.94 mg/dL (ref 0.44–1.00)
Chloride: 101 mmol/L (ref 101–111)
GFR, EST NON AFRICAN AMERICAN: 59 mL/min — AB (ref >60)
Glucose, Bld: 295 mg/dL — ABNORMAL HIGH (ref 65–99)
Potassium: 3.5 mmol/L (ref 3.5–5.1)
Sodium: 135 mmol/L (ref 135–145)
TOTAL PROTEIN: 7.4 g/dL (ref 6.5–8.1)
Total Bilirubin: 1.9 mg/dL — ABNORMAL HIGH (ref 0.3–1.2)

## 2016-10-13 LAB — HEPATITIS PANEL, ACUTE
HEP A IGM: NEGATIVE
HEP B C IGM: NEGATIVE
Hepatitis B Surface Ag: NEGATIVE

## 2016-10-13 LAB — URINALYSIS, ROUTINE W REFLEX MICROSCOPIC
Glucose, UA: 50 mg/dL — AB
Hgb urine dipstick: NEGATIVE
KETONES UR: NEGATIVE mg/dL
Nitrite: NEGATIVE
PH: 5 (ref 5.0–8.0)
Protein, ur: 30 mg/dL — AB
Specific Gravity, Urine: 1.018 (ref 1.005–1.030)

## 2016-10-13 LAB — HCV RNA QUANT RFLX ULTRA OR GENOTYP
HCV RNA QNT(LOG COPY/ML): UNDETERMINED {Log_IU}/mL
HEPATITIS C QUANTITATION: NOT DETECTED [IU]/mL

## 2016-10-13 LAB — IGG: IgG (Immunoglobin G), Serum: 1162 mg/dL (ref 700–1600)

## 2016-10-13 LAB — ANA W/REFLEX IF POSITIVE: ANA: NEGATIVE

## 2016-10-13 LAB — CMV IGM: CMV IgM: <30 AU/mL (ref 0.0–29.9)

## 2016-10-13 MED ORDER — INSULIN GLARGINE 100 UNIT/ML ~~LOC~~ SOLN
40.0000 [IU] | Freq: Every day | SUBCUTANEOUS | Status: DC
  Filled 2016-10-13: qty 0.4

## 2016-10-13 MED ORDER — INSULIN GLARGINE 100 UNIT/ML ~~LOC~~ SOLN
40.0000 [IU] | Freq: Every day | SUBCUTANEOUS | Status: DC
  Administered 2016-10-13: 40 [IU] via SUBCUTANEOUS
  Filled 2016-10-13 (×2): qty 0.4

## 2016-10-13 MED ORDER — MORPHINE SULFATE (PF) 4 MG/ML IV SOLN
1.0000 mg | Freq: Once | INTRAVENOUS | Status: AC
  Administered 2016-10-13: 1 mg via INTRAVENOUS
  Filled 2016-10-13: qty 1

## 2016-10-13 NOTE — Progress Notes (Signed)
PROGRESS NOTE [facility]   Jaime Boone   OIZ:[id number] DOB: 07-15-1940  DOA: 10/11/2016 PCP: Prince Solian, MD   Brief Narrative:  Patient is a 76 year old female with medical history significant for hepatitis C, asthma, GERD, type 2 diabetes and hypertension who presented to emergency department complaining of nausea, vomiting and constipation. Patient has been admitted for lab findings of elevated LFTs and GI evaluation.  Subjective: Patient seen and examined, doing much better this morning. Mild abdominal pain this am. Had headaches overnight which resolved with pain medication.   Assessment & Plan: Elevated LFT's  Likely 2/2 medication cipro and nitrofurantoin. LFT's trending down after stopping  ? Autoimmune, no Hep C relapse as HCV  Will repeat LFT in AM if continue trending down can d/c in AM. Patient has hepatology that she follow as OP  GI recommendations appreciated  Liver image, [place], CT and MRI no acute findings   Concern for UTI  Initially treated with Cipro as patient had a positive UA, PTA, although UA on admission not really concerning for UTI. Abx stopped due to likely causing hepatic toxicity. Repeat UA questionable  Await urine cultures.    Small Left Renal Masses / Rule out renal Abscesses  Unlikely to be abscess, no clinical indication of this afebrile, no WBC, ? UA  Urology consulted recommendations appreciated  Hx Hepatitis C  No relapse, successfully treated  Chronic Anemia  Hgb stable   Constipation  Resolved  Continue current treatment   DVT prophylaxis: Ambulation  Code Status: FULL  Family Communication: None at bedside  Disposition Plan: Home in AM if LFT trending down   Consultants:   GI   Urology   Procedures:   None   Antimicrobials: Anti-infectives    Start     Dose/Rate Route Frequency Ordered Stop   10/12/16 0600  ciprofloxacin (CIPRO) IVPB 400 mg  Status:  Discontinued     400 mg 200 mL/hr over 60 Minutes  Intravenous Every 12 hours 10/12/16 0520 10/12/16 1122        Objective: Vitals:   10/12/16 1505 10/12/16 2047 10/13/16 0622 10/13/16 1307  BP:  120/73 91/60 129/83  Pulse:  68 67 84  Resp:  16 17 16   Temp:  97.7 F (36.5 C) 98.2 F (36.8 C) 98.3 F (36.8 C)  TempSrc:  Oral Oral Oral  SpO2:  100% 99% 100%  Weight: 91.4 kg (201 lb 8 oz)     Height: 5\' 3"  (1.6 m)       Intake/Output Summary (Last 24 hours) at 10/13/16 1737 Last data filed at 10/13/16 1215  Gross per 24 hour  Intake              720 ml  Output                0 ml  Net              720 ml   Filed Weights   10/12/16 1505  Weight: 91.4 kg (201 lb 8 oz)    Examination:  General: Pt is alert, awake, not in acute distress Cardiovascular: RRR, S1/S2 +, no rubs, no gallops Respiratory: CTA bilaterally, no wheezing, no rhonchi Abdominal: Soft, NT, ND, bowel sounds + Extremities: no edema, no cyanosis  Data Reviewed: I have personally reviewed following labs and imaging studies  CBC:  Recent Labs Lab 10/07/16 1035 10/11/16 2039 10/12/16 0456 10/13/16 0615  WBC 10.9* 8.3 9.2 6.8  NEUTROABS  --   --  7.0 4.0  HGB 10.8* 10.4* 10.6* 10.3*  HCT 34.1* 31.4* 32.3* 31.1*  MCV 64.1* 62.7* 61.1* 62.7*  PLT 368 476* 480* 149*   Basic Metabolic Panel:  Recent Labs Lab 10/07/16 1035 10/11/16 2039 10/12/16 0456 10/13/16 0615  NA 127* 135 134* 135  K 3.7 3.7 3.4* 3.5  CL 98* 99* 97* 101  CO2 19* 23 23 24   GLUCOSE 373* 301* 296* 295*  BUN 8 <5* <5* <5*  CREATININE 1.31* 0.80 0.72 0.94  CALCIUM 9.0 8.7* 8.8* 8.8*  MG  --   --  1.5*  --    GFR: Estimated Creatinine Clearance: 57.2 mL/min (by C-G formula based on SCr of 0.94 mg/dL). Liver Function Tests:  Recent Labs Lab 10/07/16 1035 10/11/16 2039 10/12/16 0456 10/13/16 0615  AST 61* 419* 411* 237*  ALT 135* 247* 265* 241*  ALKPHOS 308* 567* 587* 567*  BILITOT 1.0 2.5* 3.2* 1.9*  PROT 7.5 7.5 7.4 7.4  ALBUMIN 2.9* 3.0* 2.9* 2.9*     Recent Labs Lab 10/11/16 2039  LIPASE 17   No results for input(s): AMMONIA in the last 168 hours. Coagulation Profile:  Recent Labs Lab 10/12/16 0400  INR 1.03   Cardiac Enzymes: No results for input(s): CKTOTAL, CKMB, CKMBINDEX, TROPONINI in the last 168 hours. BNP (last 3 results) No results for input(s): PROBNP in the last 8760 hours. HbA1C: No results for input(s): HGBA1C in the last 72 hours. CBG:  Recent Labs Lab 10/12/16 1738 10/12/16 2147 10/13/16 0725 10/13/16 1152 10/13/16 1659  GLUCAP 342* 314* 293* 337* 357*   Lipid Profile: No results for input(s): CHOL, HDL, LDLCALC, TRIG, CHOLHDL, LDLDIRECT in the last 72 hours. Thyroid Function Tests: No results for input(s): TSH, T4TOTAL, FREET4, T3FREE, THYROIDAB in the last 72 hours. Anemia Panel: No results for input(s): VITAMINB12, FOLATE, FERRITIN, TIBC, IRON, RETICCTPCT in the last 72 hours. Sepsis Labs: No results for input(s): PROCALCITON, LATICACIDVEN in the last 168 hours.  No results found for this or any previous visit (from the past 240 hour(s)).       Radiology Studies: Mr Abdomen W Wo Contrast  Result Date: 10/12/2016 CLINICAL DATA:  Inpatient. Two indeterminate left renal lesions on CT study performed for nausea and vomiting. History of cirrhosis and cholecystectomy. EXAM: MRI ABDOMEN WITHOUT AND WITH CONTRAST TECHNIQUE: Multiplanar multisequence MR imaging of the abdomen was performed both before and after the administration of intravenous contrast. CONTRAST:  44mL MULTIHANCE GADOBENATE DIMEGLUMINE 529 MG/ML IV SOLN COMPARISON:  10/12/2016 CT abdomen/ pelvis.  05/30/2009 MRI abdomen. FINDINGS: Lower chest: No acute abnormality. Hepatobiliary: Normal liver size. No definite liver surface irregularity. No hepatic steatosis. Segment 4A left liver lobe 1.8 cm hemangioma is mildly decreased from 2.2 cm on 05/30/2009 MRI. No additional liver lesions. Cholecystectomy. No biliary ductal dilatation.  Common bile duct diameter 5 mm. No choledocholithiasis. No biliary or ampullary mass. Pancreas: No pancreatic mass or duct dilation.  No pancreas divisum. Spleen: Normal size. No mass. Adrenals/Urinary Tract: Normal adrenals. No hydronephrosis. There are two similar-appearing complex interpolar left renal masses measuring 1.5 x 1.4 cm anteriorly (series 1604/image 70) and 2.0 x 1.7 cm posteriorly (series 1604/image 75), both of which demonstrate poorly defined margins, prominent restricted diffusion and heterogeneous avid internal enhancement with small internal cystic spaces, both new since 05/30/2009 MRI abdomen and unchanged from CT study from earlier today. Stomach/Bowel: Small hiatal hernia. Otherwise grossly normal stomach. Visualized small and large bowel is normal caliber, with no bowel wall thickening. Vascular/Lymphatic: Normal  caliber abdominal aorta. Patent portal, splenic, hepatic and renal veins. No pathologically enlarged lymph nodes in the abdomen. Other: No abdominal ascites or focal fluid collection. Musculoskeletal: No aggressive appearing focal osseous lesions. IMPRESSION: 1. Two similar-appearing indeterminate complex interpolar left renal masses measuring 1.5 cm anteriorly and 2.0 cm posteriorly with heterogeneous avid enhancement and small internal cystic spaces. These masses demonstrate poorly defined margins and prominent restricted diffusion, raising consideration for developing left renal abscesses. Renal cell carcinoma cannot be excluded. Urology consultation advised. A short-term follow-up MRI abdomen without and with IV contrast could be considered in 2-3 months after a trial of antibiotic therapy, as clinically warranted. 2. No adenopathy or other findings worrisome for metastatic disease. Patent renal veins. 3. Small left liver lobe hemangioma, decreased in size since 2011. No overt morphologic changes of cirrhosis. No new liver masses. 4. Small hiatal hernia. 5. Bile ducts are within  normal post cholecystectomy limits. Electronically Signed   By: Ilona Sorrel M.D.   On: 10/12/2016 20:25   Ct Abdomen Pelvis W Contrast  Result Date: 10/12/2016 CLINICAL DATA:  Nausea and vomiting x1 day with constipation. Appendectomy, cholecystectomy and hysterectomy history. Cirrhosis and chronic hepatitis-C. EXAM: CT ABDOMEN AND PELVIS WITH CONTRAST TECHNIQUE: Multidetector CT imaging of the abdomen and pelvis was performed using the standard protocol following bolus administration of intravenous contrast. CONTRAST:  100 cc Isovue-300 IV COMPARISON:  Abdominal MRI from 05/30/2009, 10/07/2016 ultrasound FINDINGS: Lower chest: Mild lower lobe bronchiectasis bilaterally with subsegmental atelectasis and/or scarring in the lingula and both lower lobes. Normal sized cardiac chambers. Small hiatal hernia. Hepatobiliary: Status post cholecystectomy. 9 mm hypodensity in the anterior aspect of the right hepatic lobe is stable and noted to be a hemangioma by MRI. No biliary dilatation. No choledocholithiasis. Pancreas: Normal Spleen: Normal Adrenals/Urinary Tract: Normal bilateral adrenal glands. Indeterminate cystic lesions of the left kidney involving the interpolar aspect with somewhat thickened and ill-defined septa measuring approximately 2.1 cm anteriorly and 2.3 cm along the lateral aspect of the kidney. Too small to further characterize hypodensities in the interpolar and lower pole of the right kidney on order of 4 mm or so. No nephrolithiasis. No obstructive uropathy. The urinary bladder is unremarkable. Stomach/Bowel: Contracted stomach with normal small bowel rotation. No small bowel dilatation, inflammation or obstruction. Status post appendectomy by report. A moderate amount of fecal retention is seen throughout large bowel. No large bowel wall mass or obstruction. Vascular/Lymphatic: Small porta hepatic lymph nodes the largest approximately 1 cm short axis with small subcentimeter celiac axis lymph  nodes noted as well. No lymphadenopathy. Aortoiliac atherosclerosis without aneurysm. Reproductive: Hysterectomy.  No adnexal mass. Other: No abdominal wall hernia or abnormality. No abdominopelvic ascites. Musculoskeletal: Grade 1 anterolisthesis of L4 on L5 with degenerative disc disease and vacuum disc phenomenon most notably at T11-12 and L5-S1. Lesser degrees of disc space narrowing noted from L3 through L5. IMPRESSION: 1. Increased colonic stool burden consistent with constipation. No bowel obstruction or inflammation. 2. Indeterminate septated hypodense lesions of the interpolar left kidney on the order of 2.1 cm anteriorly and 2.3 cm laterally. Renal protocol MRI or CT without and contrast for more definitive classification of Bosniak criteria is recommended. 3. Bilateral lower lobe bronchiectasis. 4. Stable 9 mm right hepatic hypodense lesion consistent with a hemangioma by prior MRI. 5. Cholecystectomy, appendectomy and hysterectomy. 6. Aortoiliac atherosclerosis. 7. Thoracolumbar spondylosis. Electronically Signed   By: Ashley Royalty M.D.   On: 10/12/2016 00:48      Scheduled Meds: .  insulin aspart  0-9 Units Subcutaneous TID WC  . insulin glargine  40 Units Subcutaneous QHS  . ondansetron  4 mg Intravenous Once  . pantoprazole (PROTONIX) IV  40 mg Intravenous Daily   Continuous Infusions: . sodium chloride       LOS: 0 days    Time spent: Total of 25 minutes spent with pt, greater than 50% of which was spent in discussion of  treatment, counseling and coordination of care    Chipper Oman, MD Pager: Text Page via [email or web address]  [phone]  If 7PM-7AM, please contact night-coverage [email or web address] Password [facility] 10/13/2016, 5:37 PM

## 2016-10-13 NOTE — Progress Notes (Signed)
Patient concerned that she has not spoken with urology team yet, and does not want to stay in the hospital and get "charged extra". She was given notice by CM about hospital charges.   RN called attending MD, Quincy Simmonds, and he confirmed that he had spoken with urology, and that they would see the patient. He could not give me a time etc, but did confirm that patient was going to be seen.

## 2016-10-13 NOTE — Progress Notes (Signed)
[facility] Progress Note  Subjective:  Feels good.  Tolerated breakfast well today.  No nausea or abdominal pain.  Says that she had a throbbing headache in the back of her head last night that was finally relieved by morphine.  Objective:  Vital signs in last 24 hours: Temp:  [97.7 F (36.5 C)-98.2 F (36.8 C)] 98.2 F (36.8 C) (07/17 0622) Pulse Rate:  [66-68] 67 (07/17 0622) Resp:  [14-17] 17 (07/17 0622) BP: (91-125)/(56-73) 91/60 (07/17 0622) SpO2:  [99 %-100 %] 99 % (07/17 0622) Weight:  [201 lb 8 oz (91.4 kg)] 201 lb 8 oz (91.4 kg) (07/16 1505) Last BM Date: 10/12/16 General:  Alert, Well-developed, in NAD Heart:  Regular rate and rhythm; no murmurs Pulm:  CTAB.  No increased WOB. Abdomen:  Soft, non-distended.  BS present.  Non-tender.  Extremities:  Without edema. Neurologic:  Alert and oriented x 4;  grossly normal neurologically. Psych:  Alert and cooperative. Normal mood and affect.  Intake/Output from previous day: 07/16 0701 - 07/17 0700 In: 360 [P.O.:360] Out: -  Intake/Output this shift: Total I/O In: 360 [P.O.:360] Out: -   Lab Results:  Recent Labs  10/11/16 2039 10/12/16 0456 10/13/16 0615  WBC 8.3 9.2 6.8  HGB 10.4* 10.6* 10.3*  HCT 31.4* 32.3* 31.1*  PLT 476* 480* 497*   BMET  Recent Labs  10/11/16 2039 10/12/16 0456 10/13/16 0615  NA 135 134* 135  K 3.7 3.4* 3.5  CL 99* 97* 101  CO2 '23 23 24  '$ GLUCOSE 301* 296* 295*  BUN <5* <5* <5*  CREATININE 0.80 0.72 0.94  CALCIUM 8.7* 8.8* 8.8*   LFT  Recent Labs  10/12/16 0456 10/13/16 0615  PROT 7.4 7.4  ALBUMIN 2.9* 2.9*  AST 411* 237*  ALT 265* 241*  ALKPHOS 587* 567*  BILITOT 3.2* 1.9*  BILIDIR 2.2*  --   IBILI 1.0*  --    PT/INR  Recent Labs  10/12/16 0400  LABPROT 13.5  INR 1.03   Mr Abdomen W Wo Contrast  Result Date: 10/12/2016 CLINICAL DATA:  Inpatient. Two indeterminate left renal lesions on CT study performed for nausea and vomiting.  History of cirrhosis and cholecystectomy. EXAM: MRI ABDOMEN WITHOUT AND WITH CONTRAST TECHNIQUE: Multiplanar multisequence MR imaging of the abdomen was performed both before and after the administration of intravenous contrast. CONTRAST:  52m MULTIHANCE GADOBENATE DIMEGLUMINE 529 MG/ML IV SOLN COMPARISON:  10/12/2016 CT abdomen/ pelvis.  05/30/2009 MRI abdomen. FINDINGS: Lower chest: No acute abnormality. Hepatobiliary: Normal liver size. No definite liver surface irregularity. No hepatic steatosis. Segment 4A left liver lobe 1.8 cm hemangioma is mildly decreased from 2.2 cm on 05/30/2009 MRI. No additional liver lesions. Cholecystectomy. No biliary ductal dilatation. Common bile duct diameter 5 mm. No choledocholithiasis. No biliary or ampullary mass. Pancreas: No pancreatic mass or duct dilation.  No pancreas divisum. Spleen: Normal size. No mass. Adrenals/Urinary Tract: Normal adrenals. No hydronephrosis. There are two similar-appearing complex interpolar left renal masses measuring 1.5 x 1.4 cm anteriorly (series 1604/image 70) and 2.0 x 1.7 cm posteriorly (series 1604/image 75), both of which demonstrate poorly defined margins, prominent restricted diffusion and heterogeneous avid internal enhancement with small internal cystic spaces, both new since 05/30/2009 MRI abdomen and unchanged from CT study from earlier today. Stomach/Bowel: Small hiatal hernia. Otherwise grossly normal stomach. Visualized small and large bowel is normal caliber, with no bowel wall thickening. Vascular/Lymphatic: Normal caliber abdominal aorta. Patent portal, splenic, hepatic and renal veins. No  pathologically enlarged lymph nodes in the abdomen. Other: No abdominal ascites or focal fluid collection. Musculoskeletal: No aggressive appearing focal osseous lesions. IMPRESSION: 1. Two similar-appearing indeterminate complex interpolar left renal masses measuring 1.5 cm anteriorly and 2.0 cm posteriorly with heterogeneous avid  enhancement and small internal cystic spaces. These masses demonstrate poorly defined margins and prominent restricted diffusion, raising consideration for developing left renal abscesses. Renal cell carcinoma cannot be excluded. Urology consultation advised. A short-term follow-up MRI abdomen without and with IV contrast could be considered in 2-3 months after a trial of antibiotic therapy, as clinically warranted. 2. No adenopathy or other findings worrisome for metastatic disease. Patent renal veins. 3. Small left liver lobe hemangioma, decreased in size since 2011. No overt morphologic changes of cirrhosis. No new liver masses. 4. Small hiatal hernia. 5. Bile ducts are within normal post cholecystectomy limits. Electronically Signed   By: Ilona Sorrel M.D.   On: 10/12/2016 20:25   Ct Abdomen Pelvis W Contrast  Result Date: 10/12/2016 CLINICAL DATA:  Nausea and vomiting x1 day with constipation. Appendectomy, cholecystectomy and hysterectomy history. Cirrhosis and chronic hepatitis-C. EXAM: CT ABDOMEN AND PELVIS WITH CONTRAST TECHNIQUE: Multidetector CT imaging of the abdomen and pelvis was performed using the standard protocol following bolus administration of intravenous contrast. CONTRAST:  100 cc Isovue-300 IV COMPARISON:  Abdominal MRI from 05/30/2009, 10/07/2016 ultrasound FINDINGS: Lower chest: Mild lower lobe bronchiectasis bilaterally with subsegmental atelectasis and/or scarring in the lingula and both lower lobes. Normal sized cardiac chambers. Small hiatal hernia. Hepatobiliary: Status post cholecystectomy. 9 mm hypodensity in the anterior aspect of the right hepatic lobe is stable and noted to be a hemangioma by MRI. No biliary dilatation. No choledocholithiasis. Pancreas: Normal Spleen: Normal Adrenals/Urinary Tract: Normal bilateral adrenal glands. Indeterminate cystic lesions of the left kidney involving the interpolar aspect with somewhat thickened and ill-defined septa measuring  approximately 2.1 cm anteriorly and 2.3 cm along the lateral aspect of the kidney. Too small to further characterize hypodensities in the interpolar and lower pole of the right kidney on order of 4 mm or so. No nephrolithiasis. No obstructive uropathy. The urinary bladder is unremarkable. Stomach/Bowel: Contracted stomach with normal small bowel rotation. No small bowel dilatation, inflammation or obstruction. Status post appendectomy by report. A moderate amount of fecal retention is seen throughout large bowel. No large bowel wall mass or obstruction. Vascular/Lymphatic: Small porta hepatic lymph nodes the largest approximately 1 cm short axis with small subcentimeter celiac axis lymph nodes noted as well. No lymphadenopathy. Aortoiliac atherosclerosis without aneurysm. Reproductive: Hysterectomy.  No adnexal mass. Other: No abdominal wall hernia or abnormality. No abdominopelvic ascites. Musculoskeletal: Grade 1 anterolisthesis of L4 on L5 with degenerative disc disease and vacuum disc phenomenon most notably at T11-12 and L5-S1. Lesser degrees of disc space narrowing noted from L3 through L5. IMPRESSION: 1. Increased colonic stool burden consistent with constipation. No bowel obstruction or inflammation. 2. Indeterminate septated hypodense lesions of the interpolar left kidney on the order of 2.1 cm anteriorly and 2.3 cm laterally. Renal protocol MRI or CT without and contrast for more definitive classification of Bosniak criteria is recommended. 3. Bilateral lower lobe bronchiectasis. 4. Stable 9 mm right hepatic hypodense lesion consistent with a hemangioma by prior MRI. 5. Cholecystectomy, appendectomy and hysterectomy. 6. Aortoiliac atherosclerosis. 7. Thoracolumbar spondylosis. Electronically Signed   By: Ashley Royalty M.D.   On: 10/12/2016 00:48   Assessment / Plan: 1. Elevated LFT's: 04/09/16- AST 21, ALT 20, Alk phos 78, Tbili 0.6; 7/11/8-  AST 61, ALT 135, Alk phos 308, Tot bili 1.0; 10/11/16- AST 419,  ALT 247, Alk phos 567, tot bili 2.5; 10/12/16-AST 411, ALT 265, Alk phos 587, Tot bili 3.2- Increasing over the past 5 days, recent abx for UTI-Nitrofurantoin 10/03/16-10/05/16 then Ciprofloxacin 10/05/16-present.  Consider relation to recent abx vs other autoimmune cause.  MRI of liver ok.  AST and total bili trending down well today.  ALT and ALP stable. 2. Constipation: seen on recent CT, resolved after enema. 3. H/o Hepatitis C: most recent hepatitis panel 10/07/16-hep c >11.0, all others negative, prior tx noted in liver clinic notes with cure 4. Anemia: hgb 10.3 grams, stable today.  -Continue supportive measures. -Cipro has been stopped as this can cause DILI.  Renal seeing patient regarding liver lesion and whether or not she still needs antibiotics for UTI. -Labs for autoimmune hepatitis as well as HCV RNA, CMV pending.  Mono labs negative. -Trend LFT's.   LOS: 0 days   Wesson Stith D.  10/13/2016, 8:56 AM  Pager number [phone]

## 2016-10-13 NOTE — Care Management Obs Status (Signed)
[facility] NOTIFICATION   Patient Details  Name: Jaime Boone MRN: [id number] Date of Birth: Nov 26, 1940   Medicare Observation Status Notification Given:  Yes    Lynnell Catalan, RN 10/13/2016, 3:16 PM

## 2016-10-13 NOTE — Progress Notes (Signed)
Inpatient Diabetes Program Recommendations  AACE/ADA: New Consensus Statement on Inpatient Glycemic Control (2015)  Target Ranges:  Prepandial:   less than 140 mg/dL      Peak postprandial:   less than 180 mg/dL (1-2 hours)      Critically ill patients:  140 - 180 mg/dL   Lab Results  Component Value Date   GLUCAP 337 (H) 10/13/2016    Review of Glycemic Control  Blood sugars > 180 mg/dL. Needs a portion of her basal insulin.  Inpatient Diabetes Program Recommendations:    Add Lantus 20 units QHS. Need HgbA1C to assess glycemic control prior to admission.  Will follow.  Thank you. Lorenda Peck, RD, LDN, CDE Inpatient Diabetes Coordinator [phone]

## 2016-10-13 NOTE — Consult Note (Signed)
Reason for Consult: Small Left Renal Masses / Rule Out Renal Abscesses  Referring Physician: Kandra Nicolas Boone  Jaime Boone is an 76 y.o. female.   HPI:   1 - Small Left Renal Masses / Rule Out Renal Abscesses - 2cm mid anterior and 2.3cm mid lateral 90% endophytic complex cysts with enhancement by CT and dedicated MRI 09/2016 during hospitalization for nausea / constipation. 1 artery / 1 vein left renovascualr anatomy. No drainable fluid collections. No fevers. WBC 6.8k. Minimal perinephric stranding. NO hydro.   No UA/UCX this admission.   PMH sig for Cirrhosis with elevated LFT's (though to be from recetn ABX), Obesity, lap chole, appy, benign hyst. Her PCP is Jaime Boone.   Today "Jaime Boone" is seen in consultation for above.   Past Medical History:  Diagnosis Date  . Acid reflux   . Arthritis   . Asthma   . Cirrhosis ([place])   . 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 Degenerative joint disease   . Diabetes mellitus without complication ([place])   . Hepatitis C   . Hypertension   . Sleep apnea     Past Surgical History:  Procedure Laterality Date  . ABDOMINAL HYSTERECTOMY     TAH  . APPENDECTOMY    . DILATION AND CURETTAGE OF UTERUS    . EYE SURGERY     CATARACT  . FOOT SURGERY     BUNIONECTOMY  . GALLBLADDER SURGERY    . NOSE SURGERY      Family History  Problem Relation Age of Onset  . Hypertension Mother   . Diabetes Mother   . Cancer Father        PROSTATE  . Hypertension Father   . Diabetes Father   . Heart disease Father   . Diabetes Sister   . Heart disease Brother   . Cancer Brother        PROSTATE  . Cancer Cousin        LUNG    Social History:  reports that she quit smoking about 42 years ago. She has never used smokeless tobacco. She reports that she does not drink alcohol or use drugs.  Allergies:  Allergies  Allergen Reactions  . Aspirin Other (See Comments)    Stomach pain  . Codeine Nausea And Vomiting  .  Penicillins Swelling    THROAT Has patient had a PCN reaction causing immediate rash, facial/tongue/throat swelling, SOB or lightheadedness with hypotension: yes Has patient had a PCN reaction causing severe rash involving mucus membranes or skin necrosis: no Has patient had a PCN reaction that required hospitalization: no Has patient had a PCN reaction occurring within the last 10 years: no If all of the above answers are "NO", then may proceed with Cephalosporin use.     Medications: I have reviewed the patient's current medications.  Results for orders placed or performed during the hospital encounter of 10/11/16 (from the past 48 hour(s))  Lipase, blood     Status: None   Collection Time: 10/11/16  8:39 PM  Result Value Ref Range   Lipase 17 11 - 51 U/L  Comprehensive metabolic panel     Status: Abnormal   Collection Time: 10/11/16  8:39 PM  Result Value Ref Range   Sodium 135 135 - 145 mmol/L   Potassium 3.7 3.5 - 5.1 mmol/L   Chloride 99 (L) 101 - 111 mmol/L   CO2 23 22 - 32 mmol/L   Glucose, Bld  301 (H) 65 - 99 mg/dL   BUN <5 (L) 6 - 20 mg/dL   Creatinine, Ser 0.69 0.44 - 1.00 mg/dL   Calcium 8.7 (L) 8.9 - 10.3 mg/dL   Total Protein 7.5 6.5 - 8.1 g/dL   Albumin 3.0 (L) 3.5 - 5.0 g/dL   AST 984 (H) 15 - 41 U/L   ALT 247 (H) 14 - 54 U/L   Alkaline Phosphatase 567 (H) 38 - 126 U/L   Total Bilirubin 2.5 (H) 0.3 - 1.2 mg/dL   GFR calc non Af Amer >60 >60 mL/min   GFR calc Af Amer >60 >60 mL/min    Comment: (NOTE) The eGFR has been calculated using the CKD EPI equation. This calculation has not been validated in all clinical situations. eGFR's persistently <60 mL/min signify possible Chronic Kidney Disease.    Anion gap 13 5 - 15  CBC     Status: Abnormal   Collection Time: 10/11/16  8:39 PM  Result Value Ref Range   WBC 8.3 4.0 - 10.5 K/uL   RBC 5.01 3.87 - 5.11 MIL/uL   Hemoglobin 10.4 (L) 12.0 - 15.0 g/dL   HCT 51.3 (L) 18.3 - 43.4 %   MCV 62.7 (L) 78.0 - 100.0  fL   MCH 20.8 (L) 26.0 - 34.0 pg   MCHC 33.1 30.0 - 36.0 g/dL   RDW 70.8 94.0 - 90.3 %   Platelets 476 (H) 150 - 400 K/uL  Urinalysis, Routine w reflex microscopic     Status: Abnormal   Collection Time: 10/11/16  8:42 PM  Result Value Ref Range   Color, Urine AMBER (A) YELLOW    Comment: BIOCHEMICALS MAY BE AFFECTED BY COLOR   APPearance CLEAR CLEAR   Specific Gravity, Urine 1.009 1.005 - 1.030   pH 7.0 5.0 - 8.0   Glucose, UA 50 (A) NEGATIVE mg/dL   Hgb urine dipstick NEGATIVE NEGATIVE   Bilirubin Urine NEGATIVE NEGATIVE   Ketones, ur 20 (A) NEGATIVE mg/dL   Protein, ur 30 (A) NEGATIVE mg/dL   Nitrite NEGATIVE NEGATIVE   Leukocytes, UA TRACE (A) NEGATIVE   RBC / HPF 0-5 0 - 5 RBC/hpf   WBC, UA 0-5 0 - 5 WBC/hpf   Bacteria, UA NONE SEEN NONE SEEN   Squamous Epithelial / LPF 0-5 (A) NONE SEEN   Mucous PRESENT    Hyaline Casts, UA PRESENT   Protime-INR     Status: None   Collection Time: 10/12/16  4:00 AM  Result Value Ref Range   Prothrombin Time 13.5 11.4 - 15.2 seconds   INR 1.03   Basic metabolic panel     Status: Abnormal   Collection Time: 10/12/16  4:56 AM  Result Value Ref Range   Sodium 134 (L) 135 - 145 mmol/L   Potassium 3.4 (L) 3.5 - 5.1 mmol/L   Chloride 97 (L) 101 - 111 mmol/L   CO2 23 22 - 32 mmol/L   Glucose, Bld 296 (H) 65 - 99 mg/dL   BUN <5 (L) 6 - 20 mg/dL   Creatinine, Ser 9.30 0.44 - 1.00 mg/dL   Calcium 8.8 (L) 8.9 - 10.3 mg/dL   GFR calc non Af Amer >60 >60 mL/min   GFR calc Af Amer >60 >60 mL/min    Comment: (NOTE) The eGFR has been calculated using the CKD EPI equation. This calculation has not been validated in all clinical situations. eGFR's persistently <60 mL/min signify possible Chronic Kidney Disease.    Anion gap 14  5 - 15  Hepatic function panel     Status: Abnormal   Collection Time: 10/12/16  4:56 AM  Result Value Ref Range   Total Protein 7.4 6.5 - 8.1 g/dL   Albumin 2.9 (L) 3.5 - 5.0 g/dL   AST 411 (H) 15 - 41 U/L   ALT 265  (H) 14 - 54 U/L   Alkaline Phosphatase 587 (H) 38 - 126 U/L   Total Bilirubin 3.2 (H) 0.3 - 1.2 mg/dL   Bilirubin, Direct 2.2 (H) 0.1 - 0.5 mg/dL   Indirect Bilirubin 1.0 (H) 0.3 - 0.9 mg/dL  CBC with Differential/Platelet     Status: Abnormal   Collection Time: 10/12/16  4:56 AM  Result Value Ref Range   WBC 9.2 4.0 - 10.5 K/uL   RBC 5.29 (H) 3.87 - 5.11 MIL/uL   Hemoglobin 10.6 (L) 12.0 - 15.0 g/dL   HCT 32.3 (L) 36.0 - 46.0 %   MCV 61.1 (L) 78.0 - 100.0 fL   MCH 20.0 (L) 26.0 - 34.0 pg   MCHC 32.8 30.0 - 36.0 g/dL   RDW 15.0 11.5 - 15.5 %   Platelets 480 (H) 150 - 400 K/uL   Neutrophils Relative % 76 %   Lymphocytes Relative 15 %   Monocytes Relative 7 %   Eosinophils Relative 2 %   Basophils Relative 0 %   Neutro Abs 7.0 1.7 - 7.7 K/uL   Lymphs Abs 1.4 0.7 - 4.0 K/uL   Monocytes Absolute 0.6 0.1 - 1.0 K/uL   Eosinophils Absolute 0.2 0.0 - 0.7 K/uL   Basophils Absolute 0.0 0.0 - 0.1 K/uL   RBC Morphology POLYCHROMASIA PRESENT     Comment: TARGET CELLS BASOPHILIC STIPPLING   Magnesium     Status: Abnormal   Collection Time: 10/12/16  4:56 AM  Result Value Ref Range   Magnesium 1.5 (L) 1.7 - 2.4 mg/dL  Glucose, capillary     Status: Abnormal   Collection Time: 10/12/16  8:16 AM  Result Value Ref Range   Glucose-Capillary 277 (H) 65 - 99 mg/dL  Glucose, capillary     Status: Abnormal   Collection Time: 10/12/16 11:55 AM  Result Value Ref Range   Glucose-Capillary 243 (H) 65 - 99 mg/dL  Mononucleosis screen     Status: None   Collection Time: 10/12/16  1:43 PM  Result Value Ref Range   Mono Screen NEGATIVE NEGATIVE  Glucose, capillary     Status: Abnormal   Collection Time: 10/12/16  5:38 PM  Result Value Ref Range   Glucose-Capillary 342 (H) 65 - 99 mg/dL  Glucose, capillary     Status: Abnormal   Collection Time: 10/12/16  9:47 PM  Result Value Ref Range   Glucose-Capillary 314 (H) 65 - 99 mg/dL  Comprehensive metabolic panel     Status: Abnormal   Collection  Time: 10/13/16  6:15 AM  Result Value Ref Range   Sodium 135 135 - 145 mmol/L   Potassium 3.5 3.5 - 5.1 mmol/L   Chloride 101 101 - 111 mmol/L   CO2 24 22 - 32 mmol/L   Glucose, Bld 295 (H) 65 - 99 mg/dL   BUN <5 (L) 6 - 20 mg/dL   Creatinine, Ser 0.94 0.44 - 1.00 mg/dL   Calcium 8.8 (L) 8.9 - 10.3 mg/dL   Total Protein 7.4 6.5 - 8.1 g/dL   Albumin 2.9 (L) 3.5 - 5.0 g/dL   AST 237 (H) 15 - 41 U/L   ALT  241 (H) 14 - 54 U/L   Alkaline Phosphatase 567 (H) 38 - 126 U/L   Total Bilirubin 1.9 (H) 0.3 - 1.2 mg/dL   GFR calc non Af Amer 59 (L) >60 mL/min   GFR calc Af Amer >60 >60 mL/min    Comment: (NOTE) The eGFR has been calculated using the CKD EPI equation. This calculation has not been validated in all clinical situations. eGFR's persistently <60 mL/min signify possible Chronic Kidney Disease.    Anion gap 10 5 - 15  CBC with Differential/Platelet     Status: Abnormal   Collection Time: 10/13/16  6:15 AM  Result Value Ref Range   WBC 6.8 4.0 - 10.5 K/uL   RBC 4.96 3.87 - 5.11 MIL/uL   Hemoglobin 10.3 (L) 12.0 - 15.0 g/dL   HCT 19.3 (L) 79.0 - 24.0 %   MCV 62.7 (L) 78.0 - 100.0 fL   MCH 20.8 (L) 26.0 - 34.0 pg   MCHC 33.1 30.0 - 36.0 g/dL   RDW 97.3 53.2 - 99.2 %   Platelets 497 (H) 150 - 400 K/uL   Neutrophils Relative % 59 %   Lymphocytes Relative 31 %   Monocytes Relative 6 %   Eosinophils Relative 4 %   Basophils Relative 0 %   Neutro Abs 4.0 1.7 - 7.7 K/uL   Lymphs Abs 2.1 0.7 - 4.0 K/uL   Monocytes Absolute 0.4 0.1 - 1.0 K/uL   Eosinophils Absolute 0.3 0.0 - 0.7 K/uL   Basophils Absolute 0.0 0.0 - 0.1 K/uL   RBC Morphology TARGET CELLS     Comment: POLYCHROMASIA PRESENT  Glucose, capillary     Status: Abnormal   Collection Time: 10/13/16  7:25 AM  Result Value Ref Range   Glucose-Capillary 293 (H) 65 - 99 mg/dL    Mr Abdomen W Wo Contrast  Result Date: 10/12/2016 CLINICAL DATA:  Inpatient. Two indeterminate left renal lesions on CT study performed for  nausea and vomiting. History of cirrhosis and cholecystectomy. EXAM: MRI ABDOMEN WITHOUT AND WITH CONTRAST TECHNIQUE: Multiplanar multisequence MR imaging of the abdomen was performed both before and after the administration of intravenous contrast. CONTRAST:  60mL MULTIHANCE GADOBENATE DIMEGLUMINE 529 MG/ML IV SOLN COMPARISON:  10/12/2016 CT abdomen/ pelvis.  05/30/2009 MRI abdomen. FINDINGS: Lower chest: No acute abnormality. Hepatobiliary: Normal liver size. No definite liver surface irregularity. No hepatic steatosis. Segment 4A left liver lobe 1.8 cm hemangioma is mildly decreased from 2.2 cm on 05/30/2009 MRI. No additional liver lesions. Cholecystectomy. No biliary ductal dilatation. Common bile duct diameter 5 mm. No choledocholithiasis. No biliary or ampullary mass. Pancreas: No pancreatic mass or duct dilation.  No pancreas divisum. Spleen: Normal size. No mass. Adrenals/Urinary Tract: Normal adrenals. No hydronephrosis. There are two similar-appearing complex interpolar left renal masses measuring 1.5 x 1.4 cm anteriorly (series 1604/image 70) and 2.0 x 1.7 cm posteriorly (series 1604/image 75), both of which demonstrate poorly defined margins, prominent restricted diffusion and heterogeneous avid internal enhancement with small internal cystic spaces, both new since 05/30/2009 MRI abdomen and unchanged from CT study from earlier today. Stomach/Bowel: Small hiatal hernia. Otherwise grossly normal stomach. Visualized small and large bowel is normal caliber, with no bowel wall thickening. Vascular/Lymphatic: Normal caliber abdominal aorta. Patent portal, splenic, hepatic and renal veins. No pathologically enlarged lymph nodes in the abdomen. Other: No abdominal ascites or focal fluid collection. Musculoskeletal: No aggressive appearing focal osseous lesions. IMPRESSION: 1. Two similar-appearing indeterminate complex interpolar left renal masses measuring 1.5 cm anteriorly  and 2.0 cm posteriorly with  heterogeneous avid enhancement and small internal cystic spaces. These masses demonstrate poorly defined margins and prominent restricted diffusion, raising consideration for developing left renal abscesses. Renal cell carcinoma cannot be excluded. Urology consultation advised. A short-term follow-up MRI abdomen without and with IV contrast could be considered in 2-3 months after a trial of antibiotic therapy, as clinically warranted. 2. No adenopathy or other findings worrisome for metastatic disease. Patent renal veins. 3. Small left liver lobe hemangioma, decreased in size since 2011. No overt morphologic changes of cirrhosis. No new liver masses. 4. Small hiatal hernia. 5. Bile ducts are within normal post cholecystectomy limits. Electronically Signed   By: Ilona Sorrel M.D.   On: 10/12/2016 20:25   Ct Abdomen Pelvis W Contrast  Result Date: 10/12/2016 CLINICAL DATA:  Nausea and vomiting x1 day with constipation. Appendectomy, cholecystectomy and hysterectomy history. Cirrhosis and chronic hepatitis-C. EXAM: CT ABDOMEN AND PELVIS WITH CONTRAST TECHNIQUE: Multidetector CT imaging of the abdomen and pelvis was performed using the standard protocol following bolus administration of intravenous contrast. CONTRAST:  100 cc Isovue-300 IV COMPARISON:  Abdominal MRI from 05/30/2009, 10/07/2016 ultrasound FINDINGS: Lower chest: Mild lower lobe bronchiectasis bilaterally with subsegmental atelectasis and/or scarring in the lingula and both lower lobes. Normal sized cardiac chambers. Small hiatal hernia. Hepatobiliary: Status post cholecystectomy. 9 mm hypodensity in the anterior aspect of the right hepatic lobe is stable and noted to be a hemangioma by MRI. No biliary dilatation. No choledocholithiasis. Pancreas: Normal Spleen: Normal Adrenals/Urinary Tract: Normal bilateral adrenal glands. Indeterminate cystic lesions of the left kidney involving the interpolar aspect with somewhat thickened and ill-defined septa  measuring approximately 2.1 cm anteriorly and 2.3 cm along the lateral aspect of the kidney. Too small to further characterize hypodensities in the interpolar and lower pole of the right kidney on order of 4 mm or so. No nephrolithiasis. No obstructive uropathy. The urinary bladder is unremarkable. Stomach/Bowel: Contracted stomach with normal small bowel rotation. No small bowel dilatation, inflammation or obstruction. Status post appendectomy by report. A moderate amount of fecal retention is seen throughout large bowel. No large bowel wall mass or obstruction. Vascular/Lymphatic: Small porta hepatic lymph nodes the largest approximately 1 cm short axis with small subcentimeter celiac axis lymph nodes noted as well. No lymphadenopathy. Aortoiliac atherosclerosis without aneurysm. Reproductive: Hysterectomy.  No adnexal mass. Other: No abdominal wall hernia or abnormality. No abdominopelvic ascites. Musculoskeletal: Grade 1 anterolisthesis of L4 on L5 with degenerative disc disease and vacuum disc phenomenon most notably at T11-12 and L5-S1. Lesser degrees of disc space narrowing noted from L3 through L5. IMPRESSION: 1. Increased colonic stool burden consistent with constipation. No bowel obstruction or inflammation. 2. Indeterminate septated hypodense lesions of the interpolar left kidney on the order of 2.1 cm anteriorly and 2.3 cm laterally. Renal protocol MRI or CT without and contrast for more definitive classification of Bosniak criteria is recommended. 3. Bilateral lower lobe bronchiectasis. 4. Stable 9 mm right hepatic hypodense lesion consistent with a hemangioma by prior MRI. 5. Cholecystectomy, appendectomy and hysterectomy. 6. Aortoiliac atherosclerosis. 7. Thoracolumbar spondylosis. Electronically Signed   By: Ashley Royalty M.D.   On: 10/12/2016 00:48    Review of Systems  Constitutional: Positive for malaise/fatigue.  HENT: Negative.   Eyes: Negative.   Respiratory: Negative.   Cardiovascular:  Negative.   Gastrointestinal: Negative.   Genitourinary: Negative for flank pain and hematuria.  Musculoskeletal: Negative.   Skin: Negative.   Neurological: Negative.   Endo/Heme/Allergies: Negative.  Psychiatric/Behavioral: Negative.    Blood pressure 91/60, pulse 67, temperature 98.2 F (36.8 C), temperature source Oral, resp. rate 17, height '5\' 3"'$  (1.6 m), weight 91.4 kg (201 lb 8 oz), SpO2 99 %. Physical Exam  Constitutional: She is oriented to person, place, and time. She appears well-developed.  HENT:  Head: Normocephalic.  Eyes: Pupils are equal, round, and reactive to light.  Neck: Normal range of motion.  Cardiovascular: Normal rate.   Respiratory: Effort normal.  GI:  Few scars w/o hernias. Moderate truncal obesity.   Genitourinary:  Genitourinary Comments: No CVAT  Musculoskeletal: Normal range of motion.  Neurological: She is alert and oriented to person, place, and time.  Skin: Skin is warm.    Assessment/Plan:  1 - Small Left Renal Masses / Rule Out Renal Abscesses - VERY LOW suspicion for renal abscesses. THere are no drainable fluid collections of hydro that would warrant any sort of procedural intervention. 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   We will request outpatient f/u imaging in 29mo with abd CT through our office.   UA/UCX today to help r/o infectious etiology, if normal, then no antimicrobials warranted at this point.   Please call me directly with questions.   Charles Andringa 10/13/2016, 8:35 AM

## 2016-10-14 DIAGNOSIS — K759 Inflammatory liver disease, unspecified: Secondary | ICD-10-CM | POA: Diagnosis not present

## 2016-10-14 DIAGNOSIS — Z8619 Personal history of other infectious and parasitic diseases: Secondary | ICD-10-CM | POA: Diagnosis not present

## 2016-10-14 DIAGNOSIS — K5900 Constipation, unspecified: Secondary | ICD-10-CM

## 2016-10-14 DIAGNOSIS — B179 Acute viral hepatitis, unspecified: Secondary | ICD-10-CM | POA: Diagnosis not present

## 2016-10-14 DIAGNOSIS — I10 Essential (primary) hypertension: Secondary | ICD-10-CM | POA: Diagnosis not present

## 2016-10-14 DIAGNOSIS — R1084 Generalized abdominal pain: Secondary | ICD-10-CM

## 2016-10-14 DIAGNOSIS — R935 Abnormal findings on diagnostic imaging of other abdominal regions, including retroperitoneum: Secondary | ICD-10-CM | POA: Diagnosis not present

## 2016-10-14 DIAGNOSIS — G4730 Sleep apnea, unspecified: Secondary | ICD-10-CM | POA: Diagnosis not present

## 2016-10-14 DIAGNOSIS — N2889 Other specified disorders of kidney and ureter: Secondary | ICD-10-CM

## 2016-10-14 DIAGNOSIS — D649 Anemia, unspecified: Secondary | ICD-10-CM | POA: Diagnosis not present

## 2016-10-14 DIAGNOSIS — E119 Type 2 diabetes mellitus without complications: Secondary | ICD-10-CM | POA: Diagnosis not present

## 2016-10-14 DIAGNOSIS — R7989 Other specified abnormal findings of blood chemistry: Secondary | ICD-10-CM | POA: Diagnosis not present

## 2016-10-14 LAB — HEPATIC FUNCTION PANEL
ALK PHOS: 457 U/L — AB (ref 38–126)
ALT: 155 U/L — AB (ref 14–54)
AST: 72 U/L — AB (ref 15–41)
Albumin: 2.7 g/dL — ABNORMAL LOW (ref 3.5–5.0)
BILIRUBIN DIRECT: 0.4 mg/dL (ref 0.1–0.5)
BILIRUBIN INDIRECT: 0.5 mg/dL (ref 0.3–0.9)
BILIRUBIN TOTAL: 0.9 mg/dL (ref 0.3–1.2)
Total Protein: 6.8 g/dL (ref 6.5–8.1)

## 2016-10-14 LAB — CBC WITH DIFFERENTIAL/PLATELET
BASOS PCT: 0 %
Basophils Absolute: 0 10*3/uL (ref 0.0–0.1)
EOS ABS: 0.3 10*3/uL (ref 0.0–0.7)
EOS PCT: 4 %
HCT: 29.6 % — ABNORMAL LOW (ref 36.0–46.0)
HEMOGLOBIN: 9.5 g/dL — AB (ref 12.0–15.0)
Lymphocytes Relative: 26 %
Lymphs Abs: 1.9 10*3/uL (ref 0.7–4.0)
MCH: 20.3 pg — AB (ref 26.0–34.0)
MCHC: 32.1 g/dL (ref 30.0–36.0)
MCV: 63.1 fL — AB (ref 78.0–100.0)
MONO ABS: 0.5 10*3/uL (ref 0.1–1.0)
Monocytes Relative: 7 %
NEUTROS ABS: 4.7 10*3/uL (ref 1.7–7.7)
Neutrophils Relative %: 63 %
PLATELETS: 473 10*3/uL — AB (ref 150–400)
RBC: 4.69 MIL/uL (ref 3.87–5.11)
RDW: 15.4 % (ref 11.5–15.5)
WBC: 7.4 10*3/uL (ref 4.0–10.5)

## 2016-10-14 LAB — BASIC METABOLIC PANEL
ANION GAP: 10 (ref 5–15)
BUN: 7 mg/dL (ref 6–20)
CALCIUM: 8.8 mg/dL — AB (ref 8.9–10.3)
CHLORIDE: 102 mmol/L (ref 101–111)
CO2: 24 mmol/L (ref 22–32)
Creatinine, Ser: 0.86 mg/dL (ref 0.44–1.00)
GFR calc non Af Amer: >60 mL/min (ref >60)
GLUCOSE: 250 mg/dL — AB (ref 65–99)
Potassium: 3.5 mmol/L (ref 3.5–5.1)
Sodium: 136 mmol/L (ref 135–145)

## 2016-10-14 LAB — URINE CULTURE

## 2016-10-14 LAB — PHOSPHORUS: Phosphorus: 3.8 mg/dL (ref 2.5–4.6)

## 2016-10-14 LAB — GLUCOSE, CAPILLARY
Glucose-Capillary: 282 mg/dL — ABNORMAL HIGH (ref 65–99)
Glucose-Capillary: 308 mg/dL — ABNORMAL HIGH (ref 65–99)

## 2016-10-14 LAB — MAGNESIUM: MAGNESIUM: 1.8 mg/dL (ref 1.7–2.4)

## 2016-10-14 MED ORDER — PANTOPRAZOLE SODIUM 40 MG PO TBEC: 40.0000 mg | DELAYED_RELEASE_TABLET | Freq: Every day | ORAL | Status: DC

## 2016-10-14 MED ORDER — TRAMADOL HCL 50 MG PO TABS: 50.0000 mg | ORAL_TABLET | Freq: Once | ORAL | Status: DC

## 2016-10-14 NOTE — Progress Notes (Signed)
10/15/15 Nursing 0030 Dr Leeanne Mannan paged reg patient c/o of headache. Order received for tramadol 50 mg x1. Patient refused medicine. States It wont work. Will continue to monitor patient

## 2016-10-14 NOTE — Progress Notes (Signed)
[facility] Progress Note  Subjective:  Feels ok from a GI standpoint.  Eating well.  No abdominal pain, nausea.  Complaining of headaches in the back of her head that have come and go for about a month, but she is going to address this with her PCP at follow-up.    Objective:  Vital signs in last 24 hours: Temp:  [98.2 F (36.8 C)-98.4 F (36.9 C)] 98.4 F (36.9 C) (07/18 0643) Pulse Rate:  [67-84] 71 (07/18 0643) Resp:  [16] 16 (07/18 0643) BP: (112-129)/(69-83) 113/69 (07/18 0643) SpO2:  [98 %-100 %] 100 % (07/18 0643) Last BM Date: 10/12/16 General:  Alert, Well-developed, in NAD Heart:  Regular rate and rhythm; no murmurs Pulm:  CTAB.  No increased WOB.   Abdomen:  Soft, non-distended.  BS present.  Non-tender. Extremities:  Without edema. Neurologic:  Alert and oriented x 4;  grossly normal neurologically. Psych:  Alert and cooperative. Normal mood and affect.  Intake/Output from previous day: 07/17 0701 - 07/18 0700 In: 1440 [P.O.:1440] Out: 750 [Urine:750] Intake/Output this shift: Total I/O In: 360 [P.O.:360] Out: -   Lab Results:  Recent Labs  10/12/16 0456 10/13/16 0615 10/14/16 0537  WBC 9.2 6.8 7.4  HGB 10.6* 10.3* 9.5*  HCT 32.3* 31.1* 29.6*  PLT 480* 497* 473*   BMET  Recent Labs  10/12/16 0456 10/13/16 0615 10/14/16 0537  NA 134* 135 136  K 3.4* 3.5 3.5  CL 97* 101 102  CO2 '23 24 24  '$ GLUCOSE 296* 295* 250*  BUN <5* <5* 7  CREATININE 0.72 0.94 0.86  CALCIUM 8.8* 8.8* 8.8*   LFT  Recent Labs  10/14/16 0540  PROT 6.8  ALBUMIN 2.7*  AST 72*  ALT 155*  ALKPHOS 457*  BILITOT 0.9  BILIDIR 0.4  IBILI 0.5   PT/INR  Recent Labs  10/12/16 0400  LABPROT 13.5  INR 1.03   Hepatitis Panel  Recent Labs  10/12/16 0456  HEPBSAG Negative  HCVAB >11.0*  HEPAIGM Negative  HEPBIGM Negative    Mr Abdomen W Wo Contrast  Result Date: 10/12/2016 CLINICAL DATA:  Inpatient. Two indeterminate left renal lesions on  CT study performed for nausea and vomiting. History of cirrhosis and cholecystectomy. EXAM: MRI ABDOMEN WITHOUT AND WITH CONTRAST TECHNIQUE: Multiplanar multisequence MR imaging of the abdomen was performed both before and after the administration of intravenous contrast. CONTRAST:  42m MULTIHANCE GADOBENATE DIMEGLUMINE 529 MG/ML IV SOLN COMPARISON:  10/12/2016 CT abdomen/ pelvis.  05/30/2009 MRI abdomen. FINDINGS: Lower chest: No acute abnormality. Hepatobiliary: Normal liver size. No definite liver surface irregularity. No hepatic steatosis. Segment 4A left liver lobe 1.8 cm hemangioma is mildly decreased from 2.2 cm on 05/30/2009 MRI. No additional liver lesions. Cholecystectomy. No biliary ductal dilatation. Common bile duct diameter 5 mm. No choledocholithiasis. No biliary or ampullary mass. Pancreas: No pancreatic mass or duct dilation.  No pancreas divisum. Spleen: Normal size. No mass. Adrenals/Urinary Tract: Normal adrenals. No hydronephrosis. There are two similar-appearing complex interpolar left renal masses measuring 1.5 x 1.4 cm anteriorly (series 1604/image 70) and 2.0 x 1.7 cm posteriorly (series 1604/image 75), both of which demonstrate poorly defined margins, prominent restricted diffusion and heterogeneous avid internal enhancement with small internal cystic spaces, both new since 05/30/2009 MRI abdomen and unchanged from CT study from earlier today. Stomach/Bowel: Small hiatal hernia. Otherwise grossly normal stomach. Visualized small and large bowel is normal caliber, with no bowel wall thickening. Vascular/Lymphatic: Normal caliber abdominal aorta. Patent portal,  splenic, hepatic and renal veins. No pathologically enlarged lymph nodes in the abdomen. Other: No abdominal ascites or focal fluid collection. Musculoskeletal: No aggressive appearing focal osseous lesions. IMPRESSION: 1. Two similar-appearing indeterminate complex interpolar left renal masses measuring 1.5 cm anteriorly and 2.0 cm  posteriorly with heterogeneous avid enhancement and small internal cystic spaces. These masses demonstrate poorly defined margins and prominent restricted diffusion, raising consideration for developing left renal abscesses. Renal cell carcinoma cannot be excluded. Urology consultation advised. A short-term follow-up MRI abdomen without and with IV contrast could be considered in 2-3 months after a trial of antibiotic therapy, as clinically warranted. 2. No adenopathy or other findings worrisome for metastatic disease. Patent renal veins. 3. Small left liver lobe hemangioma, decreased in size since 2011. No overt morphologic changes of cirrhosis. No new liver masses. 4. Small hiatal hernia. 5. Bile ducts are within normal post cholecystectomy limits. Electronically Signed   By: Ilona Sorrel M.D.   On: 10/12/2016 20:25   Assessment / Plan: 1. Elevated LFT's:04/09/16- AST 21, ALT 20, Alk phos 78, Tbili 0.6; 7/11/8- AST 61, ALT 135, Alk phos 308, Tot bili 1.0; 10/11/16- AST 419, ALT 247, Alk phos 567, tot bili 2.5; 10/12/16-AST 411, ALT 265, Alk phos 587, Tot bili 3.2- Increasing over the past 5 days, recent abx for UTI-Nitrofurantoin 10/03/16-10/05/16 then Ciprofloxacin 10/05/16-present.  Consider relation to recent abx vs other autoimmune cause.  MRI of liver ok.  All LFT's trending down.  So far all labs negative for other causes of liver disease so suspect this was DILI. 2. Constipation:seen on recent CT, resolved after enema on 7/16.  No other BM since that time. 3. H/o Hepatitis C:most recent hepatitis panel 10/07/16-hep c >11.0, all others negative, prior tx noted in liver clinic notes with cure.  Repeat RNA here is negative. 4. Anemia:hgb down slightly at 9.5 grams today but no signs of bleeding.  -Cipro has been stopped as this can cause DILI.  Renal seeing patient regarding liver lesion and whether or not she still needs antibiotics for UTI. -AMA and ASMA still pending. -She already has follow-up with  her liver specialist in about 2 weeks.  They can repeat LFT's at that time.  She should follow-up with our office non-urgently to discuss colonoscopy.   LOS: 0 days   Katheline Brendlinger D.  10/14/2016, 9:45 AM  Pager number [phone]

## 2016-10-14 NOTE — Care Management Note (Signed)
Case Management Note  Patient Details  Name: Jaime Boone MRN: [id number] Date of Birth: 1940-04-09  Subjective/Objective: 76 y/o f admitted w/Acute hepatitis. From home.                   Action/Plan:d/c home.   Expected Discharge Date:  10/14/16               Expected Discharge Plan:  Home/Self Care  In-House Referral:     Discharge planning Services  CM Consult  Post Acute Care Choice:    Choice offered to:     DME Arranged:    DME Agency:     HH Arranged:    HH Agency:     Status of Service:  Completed, signed off  If discussed at [employer] of Stay Meetings, dates discussed:    Additional Comments:  Dessa Phi, RN 10/14/2016, 1:04 PM

## 2016-10-14 NOTE — Progress Notes (Signed)
The patient is receiving Protonix by the intravenous route.  Based on criteria approved by the Pharmacy and [place], the medication is being converted to the equivalent oral dose form.  These criteria include: -No active GI bleeding -Able to tolerate diet of full liquids (or better) or tube feeding -Able to tolerate other medications by the oral or enteral route  If you have any questions about this conversion, please contact the Pharmacy Department (phone [phone]).  Thank you.  Minda Ditto PharmD Pager [phone] 10/14/2016, 12:21 PM

## 2016-10-14 NOTE — Discharge Summary (Signed)
Physician Discharge Summary  Jaime Boone WSF:[id number] DOB: 04/26/1940 DOA: 10/11/2016  PCP: Prince Solian, MD  Admit date: 10/11/2016 Discharge date: 10/14/2016  Admitted From: Home Disposition:  Home  Recommendations for Outpatient Follow-up:  1. Follow up with PCP in 1-2 weeks 2. Follow up with Hepatologist and repeat LFT's in 1 week 3. 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 Follow up with Gastroenterology for outpatient Colonoscopy  5. Please obtain CMP/CBC, Mag, Phos in one week 6. Please follow up on the following pending results: Pending Liver Serologies  Home Health: No Equipment/Devices: None    Discharge Condition: Stable CODE STATUS: FULL CODE Diet recommendation: Heart Healthy Diabetic Diet  Brief/Interim Summary: Patient is a 76 year old female with medical history significant for hepatitis C s/p Harvoni, asthma, GERD, type 2 diabetes and hypertension who presented to emergency department complaining of nausea, vomiting and constipation. Patient was admitted for labfindings of elevated LFTs and GI evaluation. Patient underwent a CT Scan of the Abdomen on 7/16 which showed Constipation and an indeterminate septated hypodense lesions of the intrapolar Left Kidney. An MRI of the showed Two similar-appearing indeterminate complex interpolar left renal masses measuring 1.5 cm anteriorly and 2.0 cm posteriorly with heterogeneous avid enhancement and small internal cystic spaces. These masses demonstrate poorly defined margins and prominent restricted diffusion, raising consideration for developing left renal abscesses. Renal cell carcinoma cannot be excluded. Urology consultation was advised and Dr. Tresa Moore evaluated who recommended Surveillance and repeat CT in 6 months. U/A was done and was not consistent with UTI and Urine Cx showed Multiple Species. Patient was deemed medically  stable and will need to follow up with Hepatology, Gastroenterology for outpatient colonoscopy, Urology and PCP at D/C.   Discharge Diagnoses:  Principal Problem:   Acute hepatitis Active Problems:   History of hepatitis C   Constipation   Hypertension   Sleep apnea   Type 2 diabetes mellitus (HCC)  Elevated LFT's, improving  -Likely 2/2 medication cipro and nitrofurantoin. LFT's trending down after stopping Abx -? Autoimmune, no Hep C relapse as HCV; AMA and ASMA pending and will need to follow up with Hepatologist   -Patient has hepatology that she follow as OP  -GI recommendations appreciated  -Liver image, [place], CT and MRI no acute findings and Small Left Liver Lobe Hemangioma is decreased in sized and no overt morphologic changes of cirrhosis   Concern for UTI  -Initially treated with Cipro as patient had a positive UA, PTA, although UA on admission not really concerning for UTI.  -Abx stopped due to likely causing hepatic toxicity.  -Repeat UA questionable  -Repeat urine culture showed multiple species present -Patient not having symptoms so will not treat -Follow up with Urology as an outpatient     Small Left Renal Masses / Rule out renal Abscesses  -Unlikely to be abscess, no clinical indication of this afebrile, no WBC, -? UA  CT Scan of the Abdomen on 7/16 which showed Constipation and an indeterminate septated hypodense lesions of the intrapolar Left Kidney.  -An MRI of the showed Two similar-appearing indeterminate complex interpolar left renal masses measuring 1.5 cm anteriorly and 2.0 cm posteriorly with heterogeneous avid enhancement and small internal cystic spaces. These masses demonstrate poorly defined margins and prominent restricted diffusion, raising consideration for developing left renal abscesses. Renal cell carcinoma cannot be excluded.  -Urology consultation was advised and Dr. Tresa Moore evaluated who  recommended Surveillance and repeat CT in 6 months -Follow  up with Urology as an outpatient   Hx Hepatitis C  -No relapse, successfully treated  Chronic Anemia -Hgb/Hct slightly dropped and went from 10.3/31.1 -> 9.5/29.6 -Repeat CBC as an outpatient  Headaches -Chronic -Follow up with PCP for further evaluation.   Constipation  -Resolved  -Continue current treatment and advised stool softeners at home  Discharge Instructions  Discharge Instructions    Call MD for:  difficulty breathing, headache or visual disturbances    Complete by:  As directed    Call MD for:  extreme fatigue    Complete by:  As directed    Call MD for:  persistant dizziness or light-headedness    Complete by:  As directed    Call MD for:  persistant nausea and vomiting    Complete by:  As directed    Call MD for:  redness, tenderness, or signs of infection (pain, swelling, redness, odor or green/yellow discharge around incision site)    Complete by:  As directed    Call MD for:  severe uncontrolled pain    Complete by:  As directed    Call MD for:  temperature >100.4    Complete by:  As directed    Diet - low sodium heart healthy    Complete by:  As directed    Discharge instructions    Complete by:  As directed    Follow up with PCP and with Hepatologist as an outpatient. Follow up with Urology for repeat Scan and follow up with Gastroenterology for outpatient colonoscopy. Take all medications as prescribed. If symptoms change or worsen please return to the ED for evaluation.   Increase activity slowly    Complete by:  As directed      Allergies as of 10/14/2016      Reactions   Aspirin Other (See Comments)   Stomach pain   Codeine Nausea And Vomiting   Penicillins Swelling   THROAT Has patient had a PCN reaction causing immediate rash, facial/tongue/throat swelling, SOB or lightheadedness with hypotension: yes Has patient had a PCN reaction causing severe rash involving mucus membranes or skin necrosis: no Has patient had a PCN reaction that  required hospitalization: no Has patient had a PCN reaction occurring within the last 10 years: no If all of the above answers are "NO", then may proceed with Cephalosporin use.      Medication List    STOP taking these medications   ciprofloxacin 250 MG tablet Commonly known as:  CIPRO   clobetasol cream 0.05 % Commonly known as:  TEMOVATE   nitrofurantoin (macrocrystal-monohydrate) 100 MG capsule Commonly known as:  MACROBID     TAKE these medications   BD PEN NEEDLE NANO U/F 32G X 4 MM Misc Generic drug:  Insulin Pen Needle   CALTRATE 600+D 600-400 MG-UNIT tablet Generic drug:  Calcium Carbonate-Vitamin D Take 1 tablet by mouth daily.   cetirizine 10 MG tablet Commonly known as:  ZYRTEC Take 10 mg by mouth daily after breakfast.   Fish Oil 1000 MG Caps Take 1,000 mg by mouth daily after breakfast.   fluticasone 50 MCG/ACT nasal spray Commonly known as:  FLONASE Place 2 sprays into both nostrils 2 (two) times daily.   glipiZIDE 10 MG tablet Commonly known as:  GLUCOTROL Take 5-10 mg by mouth daily before breakfast. 10 mg every morning and 5 mg every evening   insulin glargine 100 UNIT/ML injection Commonly known as:  LANTUS Inject 40 Units into the skin at bedtime.   levothyroxine 50 MCG tablet Commonly known as:  SYNTHROID, LEVOTHROID Take 50 mcg by mouth daily before breakfast.   losartan 100 MG tablet Commonly known as:  COZAAR Take 50 mg by mouth 2 (two) times daily.   montelukast 10 MG tablet Commonly known as:  SINGULAIR Take 10 mg by mouth daily after breakfast.   MURINE TEARS FOR DRY EYES OP Place 1 drop into both eyes daily as needed (dry eyes).   ONE TOUCH ULTRA TEST test strip Generic drug:  glucose blood   pantoprazole 40 MG tablet Commonly known as:  PROTONIX Take 40 mg by mouth 2 (two) times daily.   PROAIR HFA 108 (90 Base) MCG/ACT inhaler Generic drug:  albuterol Inhale 2 puffs into the lungs every 6 (six) hours as needed for  wheezing or shortness of breath.   promethazine 25 MG tablet Commonly known as:  PHENERGAN Take 1 tablet (25 mg total) by mouth every 6 (six) hours as needed for nausea.   triamcinolone cream 0.1 % Commonly known as:  KENALOG Apply 1 application topically daily as needed (skin irritation).      Follow-up Information    Irene Shipper, MD. Call.   Specialty:  Gastroenterology Why:  Please call our office to make a non-urgent appt to see Dr. Henrene Pastor and discuss need for colonoscopy, etc. Contact information: [address] [address] [phone]          Allergies  Allergen Reactions  . Aspirin Other (See Comments)    Stomach pain  . Codeine Nausea And Vomiting  . Penicillins Swelling    THROAT Has patient had a PCN reaction causing immediate rash, facial/tongue/throat swelling, SOB or lightheadedness with hypotension: yes Has patient had a PCN reaction causing severe rash involving mucus membranes or skin necrosis: no Has patient had a PCN reaction that required hospitalization: no Has patient had a PCN reaction occurring within the last 10 years: no If all of the above answers are "NO", then may proceed with Cephalosporin use.     Consultations:  Gastroenterology  Urology  Procedures/Studies: Mr Abdomen W Wo Contrast  Result Date: 10/12/2016 CLINICAL DATA:  Inpatient. Two indeterminate left renal lesions on CT study performed for nausea and vomiting. History of cirrhosis and cholecystectomy. EXAM: MRI ABDOMEN WITHOUT AND WITH CONTRAST TECHNIQUE: Multiplanar multisequence MR imaging of the abdomen was performed both before and after the administration of intravenous contrast. CONTRAST:  89mL MULTIHANCE GADOBENATE DIMEGLUMINE 529 MG/ML IV SOLN COMPARISON:  10/12/2016 CT abdomen/ pelvis.  05/30/2009 MRI abdomen. FINDINGS: Lower chest: No acute abnormality. Hepatobiliary: Normal liver size. No definite liver surface irregularity. No hepatic steatosis. Segment  4A left liver lobe 1.8 cm hemangioma is mildly decreased from 2.2 cm on 05/30/2009 MRI. No additional liver lesions. Cholecystectomy. No biliary ductal dilatation. Common bile duct diameter 5 mm. No choledocholithiasis. No biliary or ampullary mass. Pancreas: No pancreatic mass or duct dilation.  No pancreas divisum. Spleen: Normal size. No mass. Adrenals/Urinary Tract: Normal adrenals. No hydronephrosis. There are two similar-appearing complex interpolar left renal masses measuring 1.5 x 1.4 cm anteriorly (series 1604/image 70) and 2.0 x 1.7 cm posteriorly (series 1604/image 75), both of which demonstrate poorly defined margins, prominent restricted diffusion and heterogeneous avid internal enhancement with small internal cystic spaces, both new since 05/30/2009 MRI abdomen and unchanged from CT study from earlier today. Stomach/Bowel: Small hiatal hernia. Otherwise grossly normal stomach. Visualized small and large bowel  is normal caliber, with no bowel wall thickening. Vascular/Lymphatic: Normal caliber abdominal aorta. Patent portal, splenic, hepatic and renal veins. No pathologically enlarged lymph nodes in the abdomen. Other: No abdominal ascites or focal fluid collection. Musculoskeletal: No aggressive appearing focal osseous lesions. IMPRESSION: 1. Two similar-appearing indeterminate complex interpolar left renal masses measuring 1.5 cm anteriorly and 2.0 cm posteriorly with heterogeneous avid enhancement and small internal cystic spaces. These masses demonstrate poorly defined margins and prominent restricted diffusion, raising consideration for developing left renal abscesses. Renal cell carcinoma cannot be excluded. Urology consultation advised. A short-term follow-up MRI abdomen without and with IV contrast could be considered in 2-3 months after a trial of antibiotic therapy, as clinically warranted. 2. No adenopathy or other findings worrisome for metastatic disease. Patent renal veins. 3. Small left  liver lobe hemangioma, decreased in size since 2011. No overt morphologic changes of cirrhosis. No new liver masses. 4. Small hiatal hernia. 5. Bile ducts are within normal post cholecystectomy limits. Electronically Signed   By: Ilona Sorrel M.D.   On: 10/12/2016 20:25   Ct Abdomen Pelvis W Contrast  Result Date: 10/12/2016 CLINICAL DATA:  Nausea and vomiting x1 day with constipation. Appendectomy, cholecystectomy and hysterectomy history. Cirrhosis and chronic hepatitis-C. EXAM: CT ABDOMEN AND PELVIS WITH CONTRAST TECHNIQUE: Multidetector CT imaging of the abdomen and pelvis was performed using the standard protocol following bolus administration of intravenous contrast. CONTRAST:  100 cc Isovue-300 IV COMPARISON:  Abdominal MRI from 05/30/2009, 10/07/2016 ultrasound FINDINGS: Lower chest: Mild lower lobe bronchiectasis bilaterally with subsegmental atelectasis and/or scarring in the lingula and both lower lobes. Normal sized cardiac chambers. Small hiatal hernia. Hepatobiliary: Status post cholecystectomy. 9 mm hypodensity in the anterior aspect of the right hepatic lobe is stable and noted to be a hemangioma by MRI. No biliary dilatation. No choledocholithiasis. Pancreas: Normal Spleen: Normal Adrenals/Urinary Tract: Normal bilateral adrenal glands. Indeterminate cystic lesions of the left kidney involving the interpolar aspect with somewhat thickened and ill-defined septa measuring approximately 2.1 cm anteriorly and 2.3 cm along the lateral aspect of the kidney. Too small to further characterize hypodensities in the interpolar and lower pole of the right kidney on order of 4 mm or so. No nephrolithiasis. No obstructive uropathy. The urinary bladder is unremarkable. Stomach/Bowel: Contracted stomach with normal small bowel rotation. No small bowel dilatation, inflammation or obstruction. Status post appendectomy by report. A moderate amount of fecal retention is seen throughout large bowel. No large bowel  wall mass or obstruction. Vascular/Lymphatic: Small porta hepatic lymph nodes the largest approximately 1 cm short axis with small subcentimeter celiac axis lymph nodes noted as well. No lymphadenopathy. Aortoiliac atherosclerosis without aneurysm. Reproductive: Hysterectomy.  No adnexal mass. Other: No abdominal wall hernia or abnormality. No abdominopelvic ascites. Musculoskeletal: Grade 1 anterolisthesis of L4 on L5 with degenerative disc disease and vacuum disc phenomenon most notably at T11-12 and L5-S1. Lesser degrees of disc space narrowing noted from L3 through L5. IMPRESSION: 1. Increased colonic stool burden consistent with constipation. No bowel obstruction or inflammation. 2. Indeterminate septated hypodense lesions of the interpolar left kidney on the order of 2.1 cm anteriorly and 2.3 cm laterally. Renal protocol MRI or CT without and contrast for more definitive classification of Bosniak criteria is recommended. 3. Bilateral lower lobe bronchiectasis. 4. Stable 9 mm right hepatic hypodense lesion consistent with a hemangioma by prior MRI. 5. Cholecystectomy, appendectomy and hysterectomy. 6. Aortoiliac atherosclerosis. 7. Thoracolumbar spondylosis. Electronically Signed   By: Ashley Royalty M.D.   On:  10/12/2016 00:48   US Abdomen Limited Ruq  Result Date: 10/07/2016 CLINICAL DATA:  75 y/o  F; elevated liver function tests. EXAM: ULTRASOUND ABDOMEN LIMITED RIGHT UPPER QUADRANT COMPARISON:  None. FINDINGS: Gallbladder: Post cholecystectomy. Common bile duct: Diameter: 7.2 mm Liver: No focal lesion identified. Within normal limits in parenchymal echogenicity. Normal direction of flow in main portal vein. IMPRESSION: Cholecystectomy.  No acute process identified. Electronically Signed   By: Kristine Garbe M.D.   On: 10/07/2016 13:41     Subjective: Seen and examined and states Nausea and vomiting have improved. No real abdominal pain but states it is sore. No CP or SOB. Had a headache  overnight. No other concerns or complaints and ready to go home.   Discharge Exam: Vitals:   10/13/16 2232 10/14/16 0643  BP: 112/70 113/69  Pulse: 67 71  Resp: 16 16  Temp: 98.2 F (36.8 C) 98.4 F (36.9 C)   Vitals:   10/13/16 0622 10/13/16 1307 10/13/16 2232 10/14/16 0643  BP: 91/60 129/83 112/70 113/69  Pulse: 67 84 67 71  Resp: 17 16 16 16   Temp: 98.2 F (36.8 C) 98.3 F (36.8 C) 98.2 F (36.8 C) 98.4 F (36.9 C)  TempSrc: Oral Oral Oral Oral  SpO2: 99% 100% 98% 100%  Weight:      Height:       General: Pt is alert, awake, not in acute distress Cardiovascular: RRR, S1/S2 +, no rubs, no gallops Respiratory: CTA bilaterally, no wheezing, no rhonchi Abdominal: Soft, Mildly tender, Mildly distended, bowel sounds + Extremities: no edema, no cyanosis  The results of significant diagnostics from this hospitalization (including imaging, microbiology, ancillary and laboratory) are listed below for reference.    Microbiology: Recent Results (from the past 240 hour(s))  Culture, Urine     Status: Abnormal   Collection Time: 10/13/16  8:37 AM  Result Value Ref Range Status   Specimen Description URINE, CLEAN CATCH  Final   Special Requests NONE  Final   Culture MULTIPLE SPECIES PRESENT, SUGGEST RECOLLECTION (A)  Final   Report Status 10/14/2016 FINAL  Final    Labs: BNP (last 3 results) No results for input(s): BNP in the last 8760 hours. Basic Metabolic Panel:  Recent Labs Lab 10/11/16 2039 10/12/16 0456 10/13/16 0615 10/14/16 0537  NA 135 134* 135 136  K 3.7 3.4* 3.5 3.5  CL 99* 97* 101 102  CO2 23 23 24 24   GLUCOSE 301* 296* 295* 250*  BUN <5* <5* <5* 7  CREATININE 0.80 0.72 0.94 0.86  CALCIUM 8.7* 8.8* 8.8* 8.8*  MG  --  1.5*  --  1.8  PHOS  --   --   --  3.8   Liver Function Tests:  Recent Labs Lab 10/11/16 2039 10/12/16 0456 10/13/16 0615 10/14/16 0540  AST 419* 411* 237* 72*  ALT 247* 265* 241* 155*  ALKPHOS 567* 587* 567* 457*  BILITOT  2.5* 3.2* 1.9* 0.9  PROT 7.5 7.4 7.4 6.8  ALBUMIN 3.0* 2.9* 2.9* 2.7*    Recent Labs Lab 10/11/16 2039  LIPASE 17   No results for input(s): AMMONIA in the last 168 hours. CBC:  Recent Labs Lab 10/11/16 2039 10/12/16 0456 10/13/16 0615 10/14/16 0537  WBC 8.3 9.2 6.8 7.4  NEUTROABS  --  7.0 4.0 4.7  HGB 10.4* 10.6* 10.3* 9.5*  HCT 31.4* 32.3* 31.1* 29.6*  MCV 62.7* 61.1* 62.7* 63.1*  PLT 476* 480* 497* 473*   Cardiac Enzymes: No results for input(s):  CKTOTAL, CKMB, CKMBINDEX, TROPONINI in the last 168 hours. BNP: Invalid input(s): POCBNP CBG:  Recent Labs Lab 10/13/16 1152 10/13/16 1659 10/13/16 2231 10/14/16 0732 10/14/16 1156  GLUCAP 337* 357* 283* 282* 308*   D-Dimer No results for input(s): DDIMER in the last 72 hours. Hgb A1c No results for input(s): HGBA1C in the last 72 hours. Lipid Profile No results for input(s): CHOL, HDL, LDLCALC, TRIG, CHOLHDL, LDLDIRECT in the last 72 hours. Thyroid function studies No results for input(s): TSH, T4TOTAL, T3FREE, THYROIDAB in the last 72 hours.  Invalid input(s): FREET3 Anemia work up No results for input(s): VITAMINB12, FOLATE, FERRITIN, TIBC, IRON, RETICCTPCT in the last 72 hours. Urinalysis    Component Value Date/Time   COLORURINE AMBER (A) 10/13/2016 0837   APPEARANCEUR HAZY (A) 10/13/2016 0837   LABSPEC 1.018 10/13/2016 0837   PHURINE 5.0 10/13/2016 0837   GLUCOSEU 50 (A) 10/13/2016 0837   HGBUR NEGATIVE 10/13/2016 0837   BILIRUBINUR SMALL (A) 10/13/2016 0837   KETONESUR NEGATIVE 10/13/2016 0837   PROTEINUR 30 (A) 10/13/2016 0837   NITRITE NEGATIVE 10/13/2016 0837   LEUKOCYTESUR MODERATE (A) 10/13/2016 0837   Sepsis Labs Invalid input(s): PROCALCITONIN,  WBC,  LACTICIDVEN Microbiology Recent Results (from the past 240 hour(s))  Culture, Urine     Status: Abnormal   Collection Time: 10/13/16  8:37 AM  Result Value Ref Range Status   Specimen Description URINE, CLEAN CATCH  Final   Special  Requests NONE  Final   Culture MULTIPLE SPECIES PRESENT, SUGGEST RECOLLECTION (A)  Final   Report Status 10/14/2016 FINAL  Final   Time coordinating discharge: 35 minutes  SIGNED:  Kerney Elbe, DO [facility] 10/14/2016, 12:59 PM Pager [phone]  If 7PM-7AM, please contact night-coverage [email or web address] Password [email or web address]

## 2016-10-15 ENCOUNTER — Other Ambulatory Visit: Payer: Self-pay | Admitting: Internal Medicine

## 2016-10-15 DIAGNOSIS — Z1231 Encounter for screening mammogram for malignant neoplasm of breast: Secondary | ICD-10-CM

## 2016-10-15 LAB — HEMOGLOBIN A1C
HEMOGLOBIN A1C: 8.6 % — AB (ref 4.8–5.6)
Mean Plasma Glucose: 200 mg/dL

## 2016-10-16 LAB — ANTI-SMOOTH MUSCLE ANTIBODY, IGG: F-Actin IgG: 15 Units (ref 0–19)

## 2016-10-16 LAB — MITOCHONDRIAL ANTIBODIES: Mitochondrial M2 Ab, IgG: 9.6 Units (ref 0.0–20.0)

## 2016-10-22 DIAGNOSIS — N2889 Other specified disorders of kidney and ureter: Secondary | ICD-10-CM | POA: Insufficient documentation

## 2016-10-22 DIAGNOSIS — R519 Headache, unspecified: Secondary | ICD-10-CM | POA: Insufficient documentation

## 2016-10-27 ENCOUNTER — Other Ambulatory Visit: Payer: Self-pay | Admitting: Internal Medicine

## 2016-10-27 DIAGNOSIS — G4452 New daily persistent headache (NDPH): Secondary | ICD-10-CM

## 2016-10-29 ENCOUNTER — Encounter: Payer: Self-pay | Admitting: Internal Medicine

## 2016-10-29 ENCOUNTER — Ambulatory Visit (INDEPENDENT_AMBULATORY_CARE_PROVIDER_SITE_OTHER): Payer: Medicare Other | Admitting: Internal Medicine

## 2016-10-29 VITALS — BP 110/70 | HR 80 | Ht 63.0 in | Wt 201.0 lb

## 2016-10-29 DIAGNOSIS — Z1211 Encounter for screening for malignant neoplasm of colon: Secondary | ICD-10-CM

## 2016-10-29 DIAGNOSIS — K719 Toxic liver disease, unspecified: Secondary | ICD-10-CM

## 2016-10-29 DIAGNOSIS — D649 Anemia, unspecified: Secondary | ICD-10-CM | POA: Diagnosis not present

## 2016-10-29 MED ORDER — NA SULFATE-K SULFATE-MG SULF 17.5-3.13-1.6 GM/177ML PO SOLN: 1.0000 | Freq: Once | ORAL | 0 refills | Status: AC

## 2016-10-29 NOTE — Patient Instructions (Signed)

## 2016-10-29 NOTE — Progress Notes (Signed)
HISTORY OF PRESENT ILLNESS:  Jaime Boone is a 76 y.o. female , [place] native, with a history of hypertension, insulin requiring diabetes mellitus, and treated hepatitis C [facility] liver specialists). She was sent to me and seen in the office February 2015 regarding recent problems with abdominal pain and the need for surveillance endoscopy to rule out esophageal varices. See that dictation for details. Has not been seen since. Outside records at that time revealed hemoglobin of 11.7. Her abdominal pain had resolved. This was felt most likely musculoskeletal. He reported negative colonoscopy elsewhere in 2008 with follow-up recommended in 2018. We did recommend proceeding with upper endoscopy to rule out varices. The patient did not follow through as recommended. She is sent today at the recommendation of the [facility] hospitalists regarding anemia and the need for colonoscopy. The patient was hospitalized 2 weeks ago with abdominal pain nausea and abnormal liver tests. She underwent extensive evaluation. She was felt to have drug-induced liver injury either secondary to ciprofloxacin or nitrofurantoin. Blood work has improved but not normalized at the time of discharge. Her clinical symptoms resolved. She has since seen her hepatologist 2 days ago. Apparently has blood work pending from that visit. During the patient's hospitalization she was found to be anemic but hemoglobins ranging between 9.5 and 10.6. Low MCV ranging between 61 and 63. Slightly elevated platelets at 480,000. Normal white blood cell count. Patient tells me that she has had chronic anemia. She tells me that she was told that she may have thalassemia though I see no documentation. She has not been on iron. She has had various GI complaints over the years, none at this time.. During the patient's hospitalization she underwent CT, ultrasound, and MRI. No evidence for cirrhosis by imaging. Platelets and prothrombin time normal. She is diabetic and takes  insulin and oral agents  REVIEW OF SYSTEMS:  All non-GI ROS negative except for sinus allergy, anxiety, arthritis, low back pain, headaches, itching, increased thirst, exertional shortness of breath  Past Medical History:  Diagnosis Date  . Acid reflux   . Arthritis   . Asthma   . Cirrhosis ([place])   . 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 Degenerative joint disease   . Diabetes mellitus without complication ([place])   . Hepatitis C   . Hypertension   . Sleep apnea     Past Surgical History:  Procedure Laterality Date  . ABDOMINAL HYSTERECTOMY     TAH  . APPENDECTOMY    . DILATION AND CURETTAGE OF UTERUS    . EYE SURGERY Left    CATARACT  . FOOT SURGERY Bilateral    BUNIONECTOMY  . GALLBLADDER SURGERY    . keiloid removal      ear  . NOSE SURGERY    . oral stone extraction      Social History Jaime Boone  reports that she quit smoking about 42 years ago. She has never used smokeless tobacco. She reports that she drinks alcohol. She reports that she does not use drugs.  family history includes Diabetes in her father, mother, and sister; Heart disease in her brother and father; Hypertension in her father and mother; Lung cancer in her cousin; Prostate cancer in her brother and father.  Allergies  Allergen Reactions  . Aspirin Other (See Comments)    Stomach pain  . Ciprofloxacin Nausea And Vomiting  . Codeine Nausea And Vomiting  . Nitrofuran Derivatives   . Penicillins Swelling    THROAT  Has patient had a PCN reaction causing immediate rash, facial/tongue/throat swelling, SOB or lightheadedness with hypotension: yes Has patient had a PCN reaction causing severe rash involving mucus membranes or skin necrosis: no Has patient had a PCN reaction that required hospitalization: no Has patient had a PCN reaction occurring within the last 10 years: no If all of the above answers are "NO", then may proceed with Cephalosporin use.         PHYSICAL EXAMINATION: Vital signs: BP 110/70   Pulse 80   Ht 5\' 3"  (1.6 m)   Wt 201 lb (91.2 kg)   BMI 35.61 kg/m   Constitutional: generally well-appearing, no acute distress Psychiatric: alert and oriented x3, cooperative Eyes: extraocular movements intact, anicteric, conjunctiva pink Mouth: oral pharynx moist, no lesions Neck: supple no lymphadenopathy Cardiovascular: heart regular rate and rhythm, no murmur Lungs: clear to auscultation bilaterally Abdomen: soft, obese, nontender, nondistended, no obvious ascites, no peritoneal signs, normal bowel sounds, no organomegaly Rectal: Deferred until colonoscopy Extremities: no clubbing cyanosis or lower extremity edema bilaterally Skin: no lesions on visible extremities Neuro: No focal deficits. Cranial nerves intact. No asterixis.    ASSESSMENT:  #1. Microcytic anemia. Possible iron deficiency. Question of thalassemia raised #2. Colon cancer screening. Last examination per patient 2008. Due for follow-up #3. Recent hospitalization with drug-induced liver injury. Improved #4. History of hepatitis C. Treated. Followed by liver specialist. No evidence for cirrhosis clinically, biochemically, or radiographically #5. Multiple general medical problems on for care of Dr. Dagmar Hait. Stable #6. Insulin requiring diabetes mellitus   PLAN:  #1. Colonoscopy for colon cancer screening and to evaluate anemia the patient is high-risk given her comorbidities and the need to address her diabetes and diabetic medications. It has been recommended that she decrease her evening Lantus from 40 units to 30 units. Also, hold her Glucotrol in the morning of the procedure..The nature of the procedure, as well as the risks, benefits, and alternatives were carefully and thoroughly reviewed with the patient. Ample time for discussion and questions allowed. The patient understood, was satisfied, and agreed to proceed. #2. Continue follow-up with her  hepatologist #3. Gen. medical care ongoing with Dr. Dagmar Hait

## 2016-11-12 ENCOUNTER — Telehealth: Payer: Self-pay | Admitting: *Deleted

## 2016-11-12 NOTE — Telephone Encounter (Signed)
Pt left message in [employer] c/o UTI, I called pt back and left her a message for to schedule OV with provider.

## 2016-11-13 ENCOUNTER — Ambulatory Visit (INDEPENDENT_AMBULATORY_CARE_PROVIDER_SITE_OTHER): Payer: Medicare Other | Admitting: Obstetrics & Gynecology

## 2016-11-13 ENCOUNTER — Encounter: Payer: Self-pay | Admitting: Obstetrics & Gynecology

## 2016-11-13 ENCOUNTER — Other Ambulatory Visit: Payer: Self-pay | Admitting: Obstetrics & Gynecology

## 2016-11-13 VITALS — BP 130/82

## 2016-11-13 DIAGNOSIS — N898 Other specified noninflammatory disorders of vagina: Secondary | ICD-10-CM

## 2016-11-13 DIAGNOSIS — R350 Frequency of micturition: Secondary | ICD-10-CM

## 2016-11-13 DIAGNOSIS — B9689 Other specified bacterial agents as the cause of diseases classified elsewhere: Secondary | ICD-10-CM

## 2016-11-13 DIAGNOSIS — N760 Acute vaginitis: Secondary | ICD-10-CM | POA: Diagnosis not present

## 2016-11-13 LAB — URINALYSIS W MICROSCOPIC + REFLEX CULTURE
Bilirubin Urine: NEGATIVE
Casts: NONE SEEN [LPF]
Crystals: NONE SEEN [HPF]
GLUCOSE, UA: NEGATIVE
HGB URINE DIPSTICK: NEGATIVE
Ketones, ur: NEGATIVE
Nitrite: NEGATIVE
PH: 5.5 (ref 5.0–8.0)
RBC / HPF: NONE SEEN RBC/HPF (ref <=2)
Specific Gravity, Urine: 1.02 (ref 1.001–1.035)
Yeast: NONE SEEN [HPF]

## 2016-11-13 LAB — WET PREP FOR TRICH, YEAST, CLUE
Trich, Wet Prep: NONE SEEN
Yeast Wet Prep HPF POC: NONE SEEN

## 2016-11-13 MED ORDER — TINIDAZOLE 500 MG PO TABS: 2.0000 g | ORAL_TABLET | Freq: Every day | ORAL | 0 refills | Status: AC

## 2016-11-13 MED ORDER — FLUCONAZOLE 150 MG PO TABS: 150.0000 mg | ORAL_TABLET | Freq: Once | ORAL | 0 refills | Status: AC

## 2016-11-13 MED ORDER — SULFAMETHOXAZOLE-TRIMETHOPRIM 800-160 MG PO TABS: 1.0000 | ORAL_TABLET | Freq: Two times a day (BID) | ORAL | 0 refills | Status: AC

## 2016-11-13 NOTE — Progress Notes (Signed)
    Jaime Boone 1940/09/13 [id number]        75 y.o.  [id number]   RP:  Urinary frequency, urgency and vaginal irritation  HPI:  Seen by Dr Toney Rakes for Annual/Gyn exam 02/2016.  H/O Hep C with Liver disease followed by Dr Dagmar Hait.  C/O urinary frequency, urgency and vaginal/vulvar irritation x a couple of days.  No pelvic pain except mild tenderness in suprapubic area.  No fever.  BMs wnl.  Past medical history,surgical history, problem list, medications, allergies, family history and social history were all reviewed and documented in the EPIC chart.  Directed ROS with pertinent positives and negatives documented in the history of present illness/assessment and plan.  Exam:  Vitals:   11/13/16 1136  BP: 130/82   General appearance:  Normal  CVAT Neg bilaterally  Mild suprapubic tenderness  Gyn exam:  Vulva normal.  Speculum:  Vagina normal with mildly increased d/c.  Wet prep done.  U/A:  Tr. Blood, WBCs present, Moderate Bacterias.  Assessment/Plan:  76 y.o. [id number]   1. Frequency of urination Allergy to Pen, MacroBID and Cipro.  Probably UTI, U. Culture pending.  Bactrim BID x 3 days sent.  Usage reviewed. Fluconazole x 1 as needed for Yeast vaginitis. -U/A, U. Culture  2. Vaginal discharge Wet prep done.  BV positive.  Tindamax sent to pharmacy.  Usage explained.  Fluconazole as needed after treatment.  3. Bacterial vaginosis Decision to treat with Tindamax at patient's preference.  [name] MD, 11:49 AM 11/13/2016

## 2016-11-13 NOTE — Patient Instructions (Addendum)
1. Frequency of urination Allergy to Pen, MacroBID and Cipro.  Probably UTI, U. Culture pending.  Bactrim BID x 3 days sent.  Usage reviewed.  Fluconazole x 1 tab per mouth after Bactrim treatment if develops yeast vaginitis. - Urinalysis with Culture Reflex  2. Vaginal discharge Wet prep done.  BV positive.  Tindamax sent to pharmacy.  Usage explained.  Fluconazole after treatment as needed for yeast infection.  3. Bacterial vaginosis Decision to treat with Tindamax at patient's preference.  Rowan, it was a pleasure to meet you today!  I will inform you of your Urine Culture result as soon as available.  I sent you a prescription of Fluconazole 1 tab per mouth after you finish the antibiotics to take if you develop vaginal itchiness.   Bacterial Vaginosis Bacterial vaginosis is a vaginal infection that occurs when the normal balance of bacteria in the vagina is disrupted. It results from an overgrowth of certain bacteria. This is the most common vaginal infection among women ages 65-44. Because bacterial vaginosis increases your risk for STIs (sexually transmitted infections), getting treated can help reduce your risk for chlamydia, gonorrhea, herpes, and HIV (human immunodeficiency virus). Treatment is also important for preventing complications in pregnant women, because this condition can cause an early (premature) delivery. What are the causes? This condition is caused by an increase in harmful bacteria that are normally present in small amounts in the vagina. However, the reason that the condition develops is not fully understood. What increases the risk? The following factors may make you more likely to develop this condition:  Having a new sexual partner or multiple sexual partners.  Having unprotected sex.  Douching.  Having an intrauterine device (IUD).  Smoking.  Drug and alcohol abuse.  Taking certain antibiotic medicines.  Being pregnant.  You cannot get bacterial  vaginosis from toilet seats, bedding, swimming pools, or contact with objects around you. What are the signs or symptoms? Symptoms of this condition include:  Grey or white vaginal discharge. The discharge can also be watery or foamy.  A fish-like odor with discharge, especially after sexual intercourse or during menstruation.  Itching in and around the vagina.  Burning or pain with urination.  Some women with bacterial vaginosis have no signs or symptoms. How is this diagnosed? This condition is diagnosed based on:  Your medical history.  A physical exam of the vagina.  Testing a sample of vaginal fluid under a microscope to look for a large amount of bad bacteria or abnormal cells. Your health care provider may use a cotton swab or a small wooden spatula to collect the sample.  How is this treated? This condition is treated with antibiotics. These may be given as a pill, a vaginal cream, or a medicine that is put into the vagina (suppository). If the condition comes back after treatment, a second round of antibiotics may be needed. Follow these instructions at home: Medicines  Take over-the-counter and prescription medicines only as told by your health care provider.  Take or use your antibiotic as told by your health care provider. Do not stop taking or using the antibiotic even if you start to feel better. General instructions  If you have a female sexual partner, tell her that you have a vaginal infection. She should see her health care provider and be treated if she has symptoms. If you have a female sexual partner, he does not need treatment.  During treatment: ? Avoid sexual activity until you finish treatment. ?  Do not douche. ? Avoid alcohol as directed by your health care provider. ? Avoid breastfeeding as directed by your health care provider.  Drink enough water and fluids to keep your urine clear or pale yellow.  Keep the area around your vagina and rectum  clean. ? Wash the area daily with warm water. ? Wipe yourself from front to back after using the toilet.  Keep all follow-up visits as told by your health care provider. This is important. How is this prevented?  Do not douche.  Wash the outside of your vagina with warm water only.  Use protection when having sex. This includes latex condoms and dental dams.  Limit how many sexual partners you have. To help prevent bacterial vaginosis, it is best to have sex with just one partner (monogamous).  Make sure you and your sexual partner are tested for STIs.  Wear cotton or cotton-lined underwear.  Avoid wearing tight pants and pantyhose, especially during summer.  Limit the amount of alcohol that you drink.  Do not use any products that contain nicotine or tobacco, such as cigarettes and e-cigarettes. If you need help quitting, ask your health care provider.  Do not use illegal drugs. Where to find more information:  Centers for Disease Control and Prevention: AppraiserFraud.fi  American Sexual Health Association (ASHA): www.ashastd.org  U.S. Department of Health and Financial controller, Office on Women's Health: DustingSprays.pl or SecuritiesCard.it Contact a health care provider if:  Your symptoms do not improve, even after treatment.  You have more discharge or pain when urinating.  You have a fever.  You have pain in your abdomen.  You have pain during sex.  You have vaginal bleeding between periods. Summary  Bacterial vaginosis is a vaginal infection that occurs when the normal balance of bacteria in the vagina is disrupted.  Because bacterial vaginosis increases your risk for STIs (sexually transmitted infections), getting treated can help reduce your risk for chlamydia, gonorrhea, herpes, and HIV (human immunodeficiency virus). Treatment is also important for preventing complications in pregnant women, because the condition  can cause an early (premature) delivery.  This condition is treated with antibiotic medicines. These may be given as a pill, a vaginal cream, or a medicine that is put into the vagina (suppository). This information is not intended to replace advice given to you by your health care provider. Make sure you discuss any questions you have with your health care provider. Document Released: 03/16/2005 Document Revised: 11/30/2015 Document Reviewed: 11/30/2015 Elsevier Interactive Patient Education  2017 Else[employer]  Urinary Tract Infection, Adult A urinary tract infection (UTI) is an infection of any part of the urinary tract, which includes the kidneys, ureters, bladder, and urethra. These organs make, store, and get rid of urine in the body. UTI can be a bladder infection (cystitis) or kidney infection (pyelonephritis). What are the causes? This infection may be caused by fungi, viruses, or bacteria. Bacteria are the most common cause of UTIs. This condition can also be caused by repeated incomplete emptying of the bladder during urination. What increases the risk? This condition is more likely to develop if:  You ignore your need to urinate or hold urine for long periods of time.  You do not empty your bladder completely during urination.  You wipe back to front after urinating or having a bowel movement, if you are female.  You are uncircumcised, if you are female.  You are constipated.  You have a urinary catheter that stays in place (indwelling).  You have a weak defense (immune) system.  You have a medical condition that affects your bowels, kidneys, or bladder.  You have diabetes.  You take antibiotic medicines frequently or for long periods of time, and the antibiotics no longer work well against certain types of infections (antibiotic resistance).  You take medicines that irritate your urinary tract.  You are exposed to chemicals that irritate your urinary tract.  You are  female.  What are the signs or symptoms? Symptoms of this condition include:  Fever.  Frequent urination or passing small amounts of urine frequently.  Needing to urinate urgently.  Pain or burning with urination.  Urine that smells bad or unusual.  Cloudy urine.  Pain in the lower abdomen or back.  Trouble urinating.  Blood in the urine.  Vomiting or being less hungry than normal.  Diarrhea or abdominal pain.  Vaginal discharge, if you are female.  How is this diagnosed? This condition is diagnosed with a medical history and physical exam. You will also need to provide a urine sample to test your urine. Other tests may be done, including:  Blood tests.  Sexually transmitted disease (STD) testing.  If you have had more than one UTI, a cystoscopy or imaging studies may be done to determine the cause of the infections. How is this treated? Treatment for this condition often includes a combination of two or more of the following:  Antibiotic medicine.  Other medicines to treat less common causes of UTI.  Over-the-counter medicines to treat pain.  Drinking enough water to stay hydrated.  Follow these instructions at home:  Take over-the-counter and prescription medicines only as told by your health care provider.  If you were prescribed an antibiotic, take it as told by your health care provider. Do not stop taking the antibiotic even if you start to feel better.  Avoid alcohol, caffeine, tea, and carbonated beverages. They can irritate your bladder.  Drink enough fluid to keep your urine clear or pale yellow.  Keep all follow-up visits as told by your health care provider. This is important.  Make sure to: ? Empty your bladder often and completely. Do not hold urine for long periods of time. ? Empty your bladder before and after sex. ? Wipe from front to back after a bowel movement if you are female. Use each tissue one time when you wipe. Contact a health  care provider if:  You have back pain.  You have a fever.  You feel nauseous or vomit.  Your symptoms do not get better after 3 days.  Your symptoms go away and then return. Get help right away if:  You have severe back pain or lower abdominal pain.  You are vomiting and cannot keep down any medicines or water. This information is not intended to replace advice given to you by your health care provider. Make sure you discuss any questions you have with your health care provider. Document Released: 12/24/2004 Document Revised: 08/28/2015 Document Reviewed: 02/04/2015 Elsevier Interactive Patient Education  2017 [employer].

## 2016-11-16 LAB — URINE CULTURE

## 2016-11-25 ENCOUNTER — Ambulatory Visit
Admission: RE | Admit: 2016-11-25 | Discharge: 2016-11-25 | Disposition: A | Discharge status: Home / Self Care | Payer: Medicare Other | Source: Ambulatory Visit | Attending: Internal Medicine | Admitting: Internal Medicine

## 2016-11-25 DIAGNOSIS — Z1231 Encounter for screening mammogram for malignant neoplasm of breast: Secondary | ICD-10-CM

## 2017-01-11 ENCOUNTER — Encounter: Payer: Medicare Other | Admitting: Internal Medicine

## 2017-01-21 ENCOUNTER — Emergency Department (HOSPITAL_COMMUNITY)
Admission: EM | Admit: 2017-01-21 | Discharge: 2017-01-21 | Disposition: A | Discharge status: Home / Self Care | Payer: Medicare Other | Attending: Emergency Medicine | Admitting: Emergency Medicine

## 2017-01-21 ENCOUNTER — Encounter (HOSPITAL_COMMUNITY): Payer: Self-pay | Admitting: *Deleted

## 2017-01-21 ENCOUNTER — Emergency Department (HOSPITAL_COMMUNITY): Payer: Medicare Other

## 2017-01-21 DIAGNOSIS — J45909 Unspecified asthma, uncomplicated: Secondary | ICD-10-CM | POA: Insufficient documentation

## 2017-01-21 DIAGNOSIS — M25551 Pain in right hip: Secondary | ICD-10-CM

## 2017-01-21 DIAGNOSIS — I10 Essential (primary) hypertension: Secondary | ICD-10-CM | POA: Diagnosis not present

## 2017-01-21 DIAGNOSIS — M25552 Pain in left hip: Secondary | ICD-10-CM | POA: Diagnosis not present

## 2017-01-21 DIAGNOSIS — Z79899 Other long term (current) drug therapy: Secondary | ICD-10-CM | POA: Insufficient documentation

## 2017-01-21 DIAGNOSIS — Z794 Long term (current) use of insulin: Secondary | ICD-10-CM | POA: Insufficient documentation

## 2017-01-21 DIAGNOSIS — E119 Type 2 diabetes mellitus without complications: Secondary | ICD-10-CM | POA: Diagnosis not present

## 2017-01-21 DIAGNOSIS — Z87891 Personal history of nicotine dependence: Secondary | ICD-10-CM | POA: Insufficient documentation

## 2017-01-21 DIAGNOSIS — M545 Low back pain: Secondary | ICD-10-CM | POA: Diagnosis present

## 2017-01-21 MED ORDER — LIDOCAINE 5 % EX PTCH: 1.0000 | MEDICATED_PATCH | CUTANEOUS | 0 refills | Status: DC

## 2017-01-21 MED ORDER — KETOROLAC TROMETHAMINE 60 MG/2ML IM SOLN
30.0000 mg | Freq: Once | INTRAMUSCULAR | Status: AC
  Administered 2017-01-21: 30 mg via INTRAMUSCULAR
  Filled 2017-01-21: qty 2

## 2017-01-21 MED ORDER — DICLOFENAC SODIUM 1 % TD GEL: 4.0000 g | Freq: Four times a day (QID) | TRANSDERMAL | 0 refills | Status: DC

## 2017-01-21 MED ORDER — METHOCARBAMOL 500 MG PO TABS: 500.0000 mg | ORAL_TABLET | Freq: Two times a day (BID) | ORAL | 0 refills | Status: DC

## 2017-01-21 MED ORDER — METHOCARBAMOL 500 MG PO TABS
500.0000 mg | ORAL_TABLET | Freq: Once | ORAL | Status: AC
  Administered 2017-01-21: 500 mg via ORAL
  Filled 2017-01-21: qty 1

## 2017-01-21 NOTE — ED Notes (Signed)
Pt transported back from radiology via wheelchair with tech, tolerated well.

## 2017-01-21 NOTE — ED Notes (Signed)
Patient ambulated to restroom without difficulty.

## 2017-01-21 NOTE — Discharge Instructions (Signed)
Expect your soreness to increase over the next 2-3 days. Take it easy, but do not lay around too much as this may make any stiffness worse.  Antiinflammatory medications: Take 400 mg of ibuprofen every 6 hours or 220 mg (over the counter dose) to 325 mg (prescription dose) of naproxen every 12 hours for the next 3 days. After this time, these medications may be used as needed for pain. Take these medications with food to avoid upset stomach. Choose only one of these medications, do not take them together.  Tylenol: Should you continue to have additional pain while taking the ibuprofen or naproxen, you may add in tylenol as needed. Your daily total maximum amount of tylenol from all sources should be limited to 4000mg /day for persons without liver problems, or 2000mg /day for those with liver problems. Diclofenac: May apply the diclofenac gel to the painful areas instead of using oral medications. Muscle relaxer: Robaxin is a muscle relaxer and may help loosen stiff muscles. Do not take the Robaxin while driving or performing other dangerous activities.  Lidocaine patches: These are available via either prescription or over-the-counter. The over-the-counter option may be more economical one and are likely just as effective. There are multiple over-the-counter brands, such as Salonpas. Exercises: Be sure to perform the attached exercises starting with three times a week and working up to performing them daily. This is an essential part of preventing long term problems.   Follow up with a primary care provider or the orthopedic specialist for any future management of these complaints.  To make an appointment with orthopedic specialist, call the number provided.

## 2017-01-21 NOTE — ED Provider Notes (Signed)
[facility] DEPART[facility] Provider Note   CSN: [id number] Arrival date & time: 01/21/17  7035     History   Chief Complaint Chief Complaint  Patient presents with  . Back Pain  . Hip Pain    HPI Jaime Boone is a 76 y.o. female.  HPI   Jaime Boone is a 76 y.o. female, with a history of arthritis, asthma, DM, with hepatitis C, hepatic cirrhosis, and HTN, presenting to the ED with lower back pain intermittent since August 2018.  Pain feels like spasming, 10/10, and radiates sometimes to the left hip and sometimes to the right hip.  States that this is a worsening of chronic back pain.  Was seen by her PCP for this issue in August and prescribed flexeril and mobic with little relief. States she had a fall in Feb or March 2018, but no falls since. No IVDU or HIV.  BG averages about 160. Denies numbness, tingling, weakness, fever/chills, N/V, change in bowel or bladder function, urinary complaints, saddle anesthesias, or any other complaints.       Past Medical History:  Diagnosis Date  . Acid reflux   . Arthritis   . Asthma   . Cirrhosis ([place])   . 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 Degenerative joint disease   . Diabetes mellitus without complication ([place])   . Hepatitis C   . Hypertension   . Sleep apnea     Patient Active Problem List   Diagnosis Date Noted  . Acute hepatitis 10/12/2016  . Hypertension 10/12/2016  . Sleep apnea 10/12/2016  . Type 2 diabetes mellitus ([place]) 10/12/2016  . Menopause 03/16/2016  . Keratosis 04/17/2015  . Vaginal lesion 03/14/2015  . Sebaceous cyst of labia 04/06/2012  . History of hepatitis C 04/06/2012  . Constipation 04/06/2012  . Postmenopausal 03/01/2012  . Vaginal atrophy 03/01/2012    Past Surgical History:  Procedure Laterality Date  . ABDOMINAL HYSTERECTOMY     TAH  . APPENDECTOMY    . DILATION AND CURETTAGE OF UTERUS    . EYE SURGERY Left    CATARACT    . FOOT SURGERY Bilateral    BUNIONECTOMY  . GALLBLADDER SURGERY    . keiloid removal      ear  . NOSE SURGERY    . oral stone extraction      OB History    Gravida Para Term Preterm AB Living   6 3 3   3 3    SAB TAB Ectopic Multiple Live Births   3       3       Home Medications    Prior to Admission medications   Medication Sig Start Date End Date Taking? Authorizing Provider  BD PEN NEEDLE NANO U/F 32G X 4 MM MISC  07/19/13   [provider]  Calcium Carbonate-Vitamin D (CALTRATE 600+D) 600-400 MG-UNIT per tablet Take 1 tablet by mouth daily.    [provider]  cetirizine (ZYRTEC) 10 MG tablet Take 10 mg by mouth daily after breakfast.    [provider]  diclofenac sodium (VOLTAREN) 1 % GEL Apply 4 g topically 4 (four) times daily. 01/21/17   Gerlean Cid C, PA-C  fluticasone (FLONASE) 50 MCG/ACT nasal spray Place 2 sprays into both nostrils 2 (two) times daily.  04/22/13   [provider]  glipiZIDE (GLUCOTROL) 10 MG tablet Take 5-10 mg by mouth daily before breakfast. 10 mg every morning and  5 mg every evening    [provider]  insulin glargine (LANTUS) 100 UNIT/ML injection Inject 40 Units into the skin at bedtime.     [provider]  levothyroxine (SYNTHROID, LEVOTHROID) 50 MCG tablet Take 50 mcg by mouth daily before breakfast.  04/27/13   [provider]  lidocaine (LIDODERM) 5 % Place 1 patch onto the skin daily. Remove & Discard patch within 12 hours or as directed by MD 01/21/17   Darnelle Corp C, PA-C  losartan (COZAAR) 100 MG tablet Take 50 mg by mouth 2 (two) times daily.     [provider]  methocarbamol (ROBAXIN) 500 MG tablet Take 1 tablet (500 mg total) by mouth 2 (two) times daily. 01/21/17   Hildred Pharo C, PA-C  montelukast (SINGULAIR) 10 MG tablet Take 10 mg by mouth daily after breakfast.     [provider]  Omega-3 Fatty Acids (FISH OIL) 1000 MG CAPS Take 1,000 mg by mouth daily  after breakfast.     [provider]  ONE TOUCH ULTRA TEST test strip  04/03/13   [provider]  pantoprazole (PROTONIX) 40 MG tablet Take 40 mg by mouth 2 (two) times daily.     [provider]  Polyvinyl Alcohol-Povidone (MURINE TEARS FOR DRY EYES OP) Place 1 drop into both eyes daily as needed (dry eyes).     [provider]  PROAIR HFA 108 (90 BASE) MCG/ACT inhaler Inhale 2 puffs into the lungs every 6 (six) hours as needed for wheezing or shortness of breath.  04/19/13   [provider]  promethazine (PHENERGAN) 25 MG tablet Take 1 tablet (25 mg total) by mouth every 6 (six) hours as needed for nausea. 10/07/16   Veryl Speak, MD  triamcinolone cream (KENALOG) 0.1 % Apply 1 application topically daily as needed (skin irritation).  09/18/13   [provider]    Family History Family History  Problem Relation Age of Onset  . Hypertension Mother   . Diabetes Mother   . Hypertension Father   . Diabetes Father   . Heart disease Father   . Prostate cancer Father   . Diabetes Sister   . Heart disease Brother   . Prostate cancer Brother   . Lung cancer Cousin   . Colon cancer Neg Hx   . Stomach cancer Neg Hx   . Esophageal cancer Neg Hx   . Pancreatic cancer Neg Hx   . Liver disease Neg Hx     Social History Social History  Substance Use Topics  . Smoking status: Former Smoker    Quit date: 03/01/1974  . Smokeless tobacco: Never Used  . Alcohol use 0.0 oz/week     Comment: rare     Allergies   Aspirin; Ciprofloxacin; Codeine; Nitrofuran derivatives; and Penicillins   Review of Systems Review of Systems  Constitutional: Negative for chills and fever.  Respiratory: Negative for shortness of breath.   Gastrointestinal: Negative for abdominal pain, nausea and vomiting.  Genitourinary: Negative for difficulty urinating.  Musculoskeletal: Positive for back pain.  Neurological: Negative for weakness and numbness.  All other  systems reviewed and are negative.    Physical Exam Updated Vital Signs BP (!) 147/89 (BP Location: Right Arm)   Pulse 94   Temp 98.4 F (36.9 C) (Oral)   Resp 20   Ht 5\' 3"  (1.6 m)   Wt 90.3 kg (199 lb)   SpO2 99%   BMI 35.25 kg/m   Physical Exam  Constitutional: She appears well-developed and well-nourished. No distress.  HENT:  Head: Normocephalic and atraumatic.  Eyes: Conjunctivae are normal.  Neck: Neck supple.  Cardiovascular: Normal rate, regular rhythm and intact distal pulses.   Pulmonary/Chest: Effort normal. No respiratory distress.  Abdominal: Soft. There is no tenderness. There is no guarding.  Musculoskeletal: She exhibits tenderness. She exhibits no edema.       Arms: Tenderness in the bilateral sacral regions into the left buttock.  No midline spinal tenderness.  Lymphadenopathy:    She has no cervical adenopathy.  Neurological: She is alert.  No noted acute sensory deficits. Strength 5/5 with flexion and extension at the bilateral hips, knees, and ankles. Slow, steady, but antalgic gait.  Skin: Skin is warm and dry. She is not diaphoretic.  Psychiatric: She has a normal mood and affect. Her behavior is normal.  Nursing note and vitals reviewed.    ED Treatments / Results  Labs (all labs ordered are listed, but only abnormal results are displayed) Labs Reviewed - No data to display  EKG  EKG Interpretation None       Radiology  Dg Hips Bilat W Or Wo Pelvis 3-4 Views  Result Date: 01/21/2017 CLINICAL DATA:  Worsening low back and bilateral hip pain since August, 2018. EXAM: DG HIP (WITH OR WITHOUT PELVIS) 3-4V BILAT COMPARISON:  None. FINDINGS: There is no evidence of hip fracture or dislocation. There is no evidence of arthropathy or other focal bone abnormality. IMPRESSION: Negative exam. Electronically Signed   By: Inge Rise M.D.   On: 01/21/2017 14:06    Procedures Procedures (including critical care time)  Medications Ordered  in ED Medications  ketorolac (TORADOL) injection 30 mg (30 mg Intramuscular Given 01/21/17 1242)  methocarbamol (ROBAXIN) tablet 500 mg (500 mg Oral Given 01/21/17 1242)     Initial Impression / Assessment and Plan / ED Course  I have reviewed the triage vital signs and the nursing notes.  Pertinent labs & imaging results that were available during my care of the patient were reviewed by me and considered in my medical decision making (see chart for details).      Patient presents with acute on chronic lower back and hip pain.  No noted neuro deficits.  No red flag symptoms. PCP and orthopedic follow-up.  Resources given. The patient was given instructions for home care as well as return precautions. Patient voices understanding of these instructions, accepts the plan, and is comfortable with discharge.   Findings and plan of care discussed with Lajean Saver, MD. Dr. Ashok Cordia personally evaluated and examined this patient.     Final Clinical Impressions(s) / ED Diagnoses   Final diagnoses:  Pain of both hip joints    New Prescriptions Discharge Medication List as of 01/21/2017  2:36 PM    START taking these medications   Details  diclofenac sodium (VOLTAREN) 1 % GEL Apply 4 g topically 4 (four) times daily., Starting Thu 01/21/2017, Print    lidocaine (LIDODERM) 5 % Place 1 patch onto the skin daily. Remove & Discard patch within 12 hours or as directed by MD, Starting Thu 01/21/2017, Print    methocarbamol (ROBAXIN) 500 MG tablet Take 1 tablet (500 mg total) by mouth 2 (two) times daily., Starting Thu 01/21/2017, Print         Kahlen Boyde, [place], PA-C 01/24/17 1740    Lajean Saver, MD 01/25/17 [phone]

## 2017-01-21 NOTE — ED Notes (Signed)
Patient transported to X-ray 

## 2017-01-21 NOTE — ED Triage Notes (Signed)
To ED for eval of lower back pain with radiation down both legs. States she had a fall in March. After was seen by Chiropractor. States pain will move from one side of lower back to the other. Ambulatory but with pain

## 2017-01-26 ENCOUNTER — Emergency Department (HOSPITAL_COMMUNITY)
Admission: EM | Admit: 2017-01-26 | Discharge: 2017-01-26 | Disposition: A | Discharge status: Home / Self Care | Payer: Medicare Other | Attending: Emergency Medicine | Admitting: Emergency Medicine

## 2017-01-26 ENCOUNTER — Encounter (HOSPITAL_COMMUNITY): Payer: Self-pay | Admitting: Emergency Medicine

## 2017-01-26 DIAGNOSIS — E119 Type 2 diabetes mellitus without complications: Secondary | ICD-10-CM | POA: Diagnosis not present

## 2017-01-26 DIAGNOSIS — Z79899 Other long term (current) drug therapy: Secondary | ICD-10-CM | POA: Insufficient documentation

## 2017-01-26 DIAGNOSIS — Z794 Long term (current) use of insulin: Secondary | ICD-10-CM | POA: Insufficient documentation

## 2017-01-26 DIAGNOSIS — J45909 Unspecified asthma, uncomplicated: Secondary | ICD-10-CM | POA: Diagnosis not present

## 2017-01-26 DIAGNOSIS — Z87891 Personal history of nicotine dependence: Secondary | ICD-10-CM | POA: Insufficient documentation

## 2017-01-26 DIAGNOSIS — I10 Essential (primary) hypertension: Secondary | ICD-10-CM | POA: Diagnosis not present

## 2017-01-26 DIAGNOSIS — M545 Low back pain: Secondary | ICD-10-CM

## 2017-01-26 DIAGNOSIS — M5440 Lumbago with sciatica, unspecified side: Secondary | ICD-10-CM | POA: Diagnosis not present

## 2017-01-26 DIAGNOSIS — M549 Dorsalgia, unspecified: Secondary | ICD-10-CM | POA: Diagnosis present

## 2017-01-26 MED ORDER — KETOROLAC TROMETHAMINE 30 MG/ML IJ SOLN
30.0000 mg | Freq: Once | INTRAMUSCULAR | Status: AC
  Administered 2017-01-26: 30 mg via INTRAMUSCULAR
  Filled 2017-01-26: qty 1

## 2017-01-26 MED ORDER — METHOCARBAMOL 500 MG PO TABS: 500.0000 mg | ORAL_TABLET | Freq: Two times a day (BID) | ORAL | 0 refills | Status: DC

## 2017-01-26 MED ORDER — METHOCARBAMOL 500 MG PO TABS
500.0000 mg | ORAL_TABLET | Freq: Once | ORAL | Status: AC
  Administered 2017-01-26: 500 mg via ORAL
  Filled 2017-01-26: qty 1

## 2017-01-26 NOTE — ED Notes (Signed)
Bed: [facility] Expected date:  Expected time:  Means of arrival:  Comments: 

## 2017-01-26 NOTE — Discharge Instructions (Signed)
You were seen here today for Back Pain: Low back pain is discomfort in the lower back that may be due to injuries to muscles and ligaments around the spine. Occasionally, it may be caused by a problem to a part of the spine called a disc. Your back pain should be treated with medicines listed below as well as back exercises and this back pain should get better over the next 2 weeks. Most patients get completely well in 4 weeks. It is important to know however, if you develop severe or worsening pain, low back pain with fever, numbness, weakness or inability to walk or urinate, you should return to the ER immediately.  Please follow up with your doctor this week for a recheck if still having symptoms.  HOME INSTRUCTIONS Self - care:  The application of heat can help soothe the pain.  Maintaining your daily activities, including walking (this is encouraged), as it will help you get better faster than just staying in bed. Do not life, push, pull anything more than 10 pounds for the next week. I am attaching back exercises that you can do at home to help facilitate your recovery.   Back Exercises - I have attached a handout on back exercises that can be done at home to help facilitate your recovery.   Medications are also useful to help with pain control.   Acetaminophen.  This medication is generally safe, and found over the counter. Take as directed for your age. You should not take more than 8 of the extra strength (500mg ) pills a day (max dose is 4000mg  total OVER one day)  Non steroidal anti inflammatory:  If you can not take medications such as ibuprofen and naproxen, medications such as Diclofenac (Voltaren) gel can be applied directly to the area of pain.   Muscle relaxants:  These medications can help with muscle tightness that is a cause of lower back pain.  Most of these medications can cause drowsiness, and it is not safe to drive or use dangerous machinery while taking them. They are primarily  helpful when taken at night before sleep.   Follow up on Friday with your orthopedics appointment.   Be aware that if you develop new symptoms, such as a fever, leg weakness, difficulty with or loss of control of your urine or bowels, abdominal pain, or more severe pain, you will need to seek medical attention and/or return to the Emergency department. Additional Information:  Your vital signs today were: BP 114/68    Pulse 86    Temp 98.3 F (36.8 C) (Oral)    Resp 16    SpO2 98%  If your blood pressure (BP) was elevated above 135/85 this visit, please have this repeated by your doctor within one month. ---------------

## 2017-01-26 NOTE — ED Provider Notes (Signed)
[facility] DEPT Provider Note   CSN: [id number] Arrival date & time: 01/26/17  1626     History   Chief Complaint Chief Complaint  Patient presents with  . back spasms    HPI Jaime Boone is a 76 y.o. female with a history of arthritis, degenerative joint disease, diabetes, hypertension who presents the emergency department today for bilateral hip pain and back pain.  Patient was recently seen here on 01/21/2017 for the same where she had a x-ray of bilateral hips that were negative for any fracture or dislocation.  Patient states that she was sent home on diclofenac gel, lidocaine and Robaxin.  She has been taking this for several days which provided her complete relief of her symptoms.  On Friday she stopped taking the medications and her pain subsided.  She was performing back exercises Sunday night and stated that instead of the normal 3 sets that she was told to do she did 10 sets because "I felt so good".  Upon awakening this morning she felt soreness in her lower back.  While on the bus she felt spasms of her back and was unable to stand that it was so painful.  She has not taken anything for this since the pain was brought on.  She did call for follow-up to orthopedist Dr. Percell Miller and has an appointment scheduled for Friday. Denies history of cancer, trauma, fever, night pain, IV drug use, recent spinal manipulation or procedures/injections, upper back pain or neck pain, numbness/tingling/weakness of the lower extremities, urinary retention, loss of bowel/bladder function, saddle anesthesia, or unexplained weight loss.  Denies dysuria, flank pain, suprapubic pain, frequency, urgency, or hematuria.   HPI  Past Medical History:  Diagnosis Date  . Acid reflux   . Arthritis   . Asthma   . Cirrhosis ([place])   . 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 Degenerative joint disease   . Diabetes mellitus without complication  ([place])   . Hepatitis C   . Hypertension   . Sleep apnea     Patient Active Problem List   Diagnosis Date Noted  . Acute hepatitis 10/12/2016  . Hypertension 10/12/2016  . Sleep apnea 10/12/2016  . Type 2 diabetes mellitus ([place]) 10/12/2016  . Menopause 03/16/2016  . Keratosis 04/17/2015  . Vaginal lesion 03/14/2015  . Sebaceous cyst of labia 04/06/2012  . History of hepatitis C 04/06/2012  . Constipation 04/06/2012  . Postmenopausal 03/01/2012  . Vaginal atrophy 03/01/2012    Past Surgical History:  Procedure Laterality Date  . ABDOMINAL HYSTERECTOMY     TAH  . APPENDECTOMY    . DILATION AND CURETTAGE OF UTERUS    . EYE SURGERY Left    CATARACT  . FOOT SURGERY Bilateral    BUNIONECTOMY  . GALLBLADDER SURGERY    . keiloid removal      ear  . NOSE SURGERY    . oral stone extraction      OB History    Gravida Para Term Preterm AB Living   6 3 3   3 3    SAB TAB Ectopic Multiple Live Births   3       3       Home Medications    Prior to Admission medications   Medication Sig Start Date End Date Taking? Authorizing Provider  Calcium Carbonate-Vitamin D (CALTRATE 600+D) 600-400 MG-UNIT per tablet Take 1 tablet by mouth daily.   Yes [provider]  cetirizine (ZYRTEC) 10 MG tablet Take 10 mg by mouth daily after breakfast.   Yes [provider]  diclofenac sodium (VOLTAREN) 1 % GEL Apply 4 g topically 4 (four) times daily. 01/21/17  Yes Joy, Shawn C, PA-C  glipiZIDE (GLUCOTROL) 10 MG tablet Take 5-10 mg by mouth daily before breakfast. 10 mg every morning and 5 mg every evening   Yes [provider]  insulin glargine (LANTUS) 100 UNIT/ML injection Inject 28 Units into the skin at bedtime.    Yes [provider]  levothyroxine (SYNTHROID, LEVOTHROID) 50 MCG tablet Take 50 mcg by mouth daily before breakfast.  04/27/13  Yes [provider]  lidocaine (LIDODERM) 5 % Place 1 patch onto the skin daily. Remove & Discard patch  within 12 hours or as directed by MD 01/21/17  Yes Joy, Shawn C, PA-C  losartan (COZAAR) 100 MG tablet Take 50 mg by mouth 2 (two) times daily.    Yes [provider]  methocarbamol (ROBAXIN) 500 MG tablet Take 1 tablet (500 mg total) by mouth 2 (two) times daily. 01/21/17  Yes Joy, Shawn C, PA-C  montelukast (SINGULAIR) 10 MG tablet Take 10 mg by mouth daily after breakfast.    Yes [provider]  pantoprazole (PROTONIX) 40 MG tablet Take 40 mg by mouth 2 (two) times daily.    Yes [provider]  Polyvinyl Alcohol-Povidone (MURINE TEARS FOR DRY EYES OP) Place 1 drop into both eyes daily as needed (dry eyes).    Yes [provider]  PROAIR HFA 108 (90 BASE) MCG/ACT inhaler Inhale 2 puffs into the lungs every 6 (six) hours as needed for wheezing or shortness of breath.  04/19/13  Yes [provider]  triamcinolone cream (KENALOG) 0.1 % Apply 1 application topically daily as needed (skin irritation).  09/18/13  Yes [provider]  BD PEN NEEDLE NANO U/F 32G X 4 MM MISC  07/19/13   [provider]  ONE TOUCH ULTRA TEST test strip  04/03/13   [provider]  promethazine (PHENERGAN) 25 MG tablet Take 1 tablet (25 mg total) by mouth every 6 (six) hours as needed for nausea. Patient not taking: Reported on 01/26/2017 10/07/16   Veryl Speak, MD    Family History Family History  Problem Relation Age of Onset  . Hypertension Mother   . Diabetes Mother   . Hypertension Father   . Diabetes Father   . Heart disease Father   . Prostate cancer Father   . Diabetes Sister   . Heart disease Brother   . Prostate cancer Brother   . Lung cancer Cousin   . Colon cancer Neg Hx   . Stomach cancer Neg Hx   . Esophageal cancer Neg Hx   . Pancreatic cancer Neg Hx   . Liver disease Neg Hx     Social History Social History  Substance Use Topics  . Smoking status: Former Smoker    Quit date: 03/01/1974  . Smokeless tobacco: Never Used    . Alcohol use 0.0 oz/week     Comment: rare     Allergies   Aspirin; Ciprofloxacin; Codeine; Nitrofuran derivatives; and Penicillins   Review of Systems Review of Systems  All other systems reviewed and are negative.    Physical Exam Updated Vital Signs BP 132/76 (BP Location: Right Arm)   Pulse 68   Temp 98.3 F (36.8 C) (Oral)   Resp 16   SpO2 98%   Physical Exam  Constitutional:  She appears well-developed and well-nourished. No distress.  Non-toxic appearing  HENT:  Head: Normocephalic and atraumatic.  Right Ear: External ear normal.  Left Ear: External ear normal.  Neck: Normal range of motion. Neck supple. No spinous process tenderness present. No neck rigidity. Normal range of motion present.  Cardiovascular: Normal rate, regular rhythm, normal heart sounds and intact distal pulses.   No murmur heard. Pulses:      Radial pulses are 2+ on the right side, and 2+ on the left side.       Femoral pulses are 2+ on the right side, and 2+ on the left side.      Dorsalis pedis pulses are 2+ on the right side, and 2+ on the left side.       Posterior tibial pulses are 2+ on the right side, and 2+ on the left side.  Pulmonary/Chest: Effort normal and breath sounds normal. No respiratory distress.  Abdominal: Soft. Bowel sounds are normal. She exhibits no pulsatile midline mass. There is no tenderness. There is no rigidity, no rebound and no CVA tenderness.  Musculoskeletal:       Right hip: She exhibits tenderness. She exhibits normal range of motion and normal strength.       Left hip: She exhibits tenderness. She exhibits normal range of motion and normal strength.  Posterior and appearance appears normal. No evidence of obvious scoliosis or kyphosis. No obvious signs of skin changes, trauma, deformity, infection. No C, T, or L spine tenderness or step-offs to palpation. No C, T paraspinal tenderness. Lumbar paraspinal TTP b/l. Lung expansion normal. Bilateral lower  extremity strength 5 out of 5. Patellar and Achilles deep tendon reflex 2+ and equal bilaterally. Sensation of lower extremities grossly intact. Gait able but patient notes painful. Lower extremity compartments soft. PT and DP 2+ b/l. Cap refill <2 seconds.   Neurological: She is alert. She has normal strength.  Skin: Skin is warm, dry and intact. Capillary refill takes less than 2 seconds. No rash noted. She is not diaphoretic. No erythema.  Nursing note and vitals reviewed.    ED Treatments / Results  Labs (all labs ordered are listed, but only abnormal results are displayed) Labs Reviewed - No data to display  EKG  EKG Interpretation None       Radiology No results found.  Procedures Procedures (including critical care time)  Medications Ordered in ED Medications  methocarbamol (ROBAXIN) tablet 500 mg (not administered)  ketorolac (TORADOL) 30 MG/ML injection 30 mg (not administered)     Initial Impression / Assessment and Plan / ED Course  I have reviewed the triage vital signs and the nursing notes.  Pertinent labs & imaging results that were available during my care of the patient were reviewed by me and considered in my medical decision making (see chart for details).        Patient with back pain.  No neurological deficits and normal neuro exam.  Patient can walk but states is painful. Pain improved after robaxin. No loss of bowel or bladder control.  No concern for cauda equina.  No pulsatile mass to suggest AAA.  No fever, night sweats, weight loss, h/o cancer, IVDU.  No urinary symptoms that would suggest this is a UTI.  Suggest continued treatment from previous visit as it has provided relief and follow up with Ortho on Friday. Strict cautions discussed.  Patient in agreement of plan and appears safe for discharge. Patient case discussed with Dr. Ellender Hose who is  in agreement with plan.  Final Clinical Impressions(s) / ED Diagnoses   Final diagnoses:  Acute  bilateral low back pain, with sciatica presence unspecified    New Prescriptions Current Discharge Medication List       Lorelle Gibbs 01/26/17 1953    Lorelle Gibbs 01/26/17 2033    Duffy Bruce, MD 01/27/17 1140

## 2017-01-26 NOTE — ED Triage Notes (Signed)
Per EMS-states started having lower back pain and spasms at bus stop around 0700-states has progressively gotten worse-history of chronic back pain

## 2017-02-05 ENCOUNTER — Encounter (HOSPITAL_COMMUNITY): Payer: Self-pay | Admitting: *Deleted

## 2017-02-05 ENCOUNTER — Other Ambulatory Visit: Payer: Self-pay

## 2017-02-05 ENCOUNTER — Emergency Department (HOSPITAL_COMMUNITY)
Admission: EM | Admit: 2017-02-05 | Discharge: 2017-02-05 | Disposition: A | Discharge status: Home / Self Care | Payer: Medicare Other | Attending: Emergency Medicine | Admitting: Emergency Medicine

## 2017-02-05 DIAGNOSIS — J45909 Unspecified asthma, uncomplicated: Secondary | ICD-10-CM | POA: Insufficient documentation

## 2017-02-05 DIAGNOSIS — Z87891 Personal history of nicotine dependence: Secondary | ICD-10-CM | POA: Insufficient documentation

## 2017-02-05 DIAGNOSIS — M545 Low back pain: Secondary | ICD-10-CM | POA: Diagnosis present

## 2017-02-05 DIAGNOSIS — M5441 Lumbago with sciatica, right side: Secondary | ICD-10-CM

## 2017-02-05 DIAGNOSIS — I10 Essential (primary) hypertension: Secondary | ICD-10-CM | POA: Insufficient documentation

## 2017-02-05 DIAGNOSIS — Z79899 Other long term (current) drug therapy: Secondary | ICD-10-CM | POA: Diagnosis not present

## 2017-02-05 DIAGNOSIS — E119 Type 2 diabetes mellitus without complications: Secondary | ICD-10-CM | POA: Diagnosis not present

## 2017-02-05 DIAGNOSIS — Z794 Long term (current) use of insulin: Secondary | ICD-10-CM | POA: Insufficient documentation

## 2017-02-05 MED ORDER — METHOCARBAMOL 500 MG PO TABS
500.0000 mg | ORAL_TABLET | Freq: Once | ORAL | Status: AC
  Administered 2017-02-05: 500 mg via ORAL
  Filled 2017-02-05: qty 1

## 2017-02-05 MED ORDER — KETOROLAC TROMETHAMINE 30 MG/ML IJ SOLN
15.0000 mg | Freq: Once | INTRAMUSCULAR | Status: AC
  Administered 2017-02-05: 15 mg via INTRAMUSCULAR
  Filled 2017-02-05: qty 1

## 2017-02-05 NOTE — ED Provider Notes (Signed)
[facility] EMERGENCY DEPARTMENT Provider Note   CSN: [id number] Arrival date & time: 02/05/17  9735     History   Chief Complaint Chief Complaint  Patient presents with  . Back Pain  . Hip Pain    HPI Jaime Boone is a 76 y.o. female w PMHx hep C, DM, arthritis, HTN, presenting to the ED for subsequent visit with right-sided lower back pain.  Patient seen multiple times for similar complaint on 01/21/17 and 01/26/17.  Patient then seen by Dr. Percell Miller with orthopedics on Friday of last week, who prescribed her steroid taper dose pack.  She states she just finished the last pill this morning, however pain has been worsening today.  She also states they did an "x-ray" of her back during this visit, and reports she was told she has a "pinched nerve."  She states she may have aggravated her pain by doing a lot of house chores yesterday, including mopping the floor and multiple loads of laundry.  She states pain is similar to the pain she has been having on and off, located in her right buttock with some pain radiating down the leg.  Denies abdominal pain, numbness or tingling, bowel or bladder incontinence, fever, history of cancer.  States during her previous ED visit, she was given Toradol and Robaxin with significant relief of symptoms.  She is here today for some relief of pain to get her to her next orthopedic appointment a week from today.  She has been prescribed Robaxin, which provided her with relief of symptoms, however she was unaware if she could take that with the steroid pack she is been taking for the past week. She has a refill remaining for robaxin.  No other pain medications taken for symptoms.  No recent injuries.  The history is provided by the patient.    Past Medical History:  Diagnosis Date  . Acid reflux   . Arthritis   . Asthma   . Cirrhosis ([place])   . 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 Degenerative joint disease    . Diabetes mellitus without complication ([place])   . Hepatitis C   . Hypertension   . Sleep apnea     Patient Active Problem List   Diagnosis Date Noted  . Acute hepatitis 10/12/2016  . Hypertension 10/12/2016  . Sleep apnea 10/12/2016  . Type 2 diabetes mellitus ([place]) 10/12/2016  . Menopause 03/16/2016  . Keratosis 04/17/2015  . Vaginal lesion 03/14/2015  . Sebaceous cyst of labia 04/06/2012  . History of hepatitis C 04/06/2012  . Constipation 04/06/2012  . Postmenopausal 03/01/2012  . Vaginal atrophy 03/01/2012    Past Surgical History:  Procedure Laterality Date  . ABDOMINAL HYSTERECTOMY     TAH  . APPENDECTOMY    . DILATION AND CURETTAGE OF UTERUS    . EYE SURGERY Left    CATARACT  . FOOT SURGERY Bilateral    BUNIONECTOMY  . GALLBLADDER SURGERY    . keiloid removal      ear  . NOSE SURGERY    . oral stone extraction      OB History    Gravida Para Term Preterm AB Living   6 3 3   3 3    SAB TAB Ectopic Multiple Live Births   3       3       Home Medications    Prior to Admission medications   Medication Sig Start Date  End Date Taking? Authorizing Provider  BD PEN NEEDLE NANO U/F 32G X 4 MM MISC  07/19/13  Yes [provider]  Calcium Carbonate-Vitamin D (CALTRATE 600+D) 600-400 MG-UNIT per tablet Take 1 tablet by mouth daily.   Yes [provider]  cetirizine (ZYRTEC) 10 MG tablet Take 10 mg by mouth daily after breakfast.   Yes [provider]  diclofenac sodium (VOLTAREN) 1 % GEL Apply 4 g topically 4 (four) times daily. 01/21/17  Yes Joy, Shawn C, PA-C  glipiZIDE (GLUCOTROL) 10 MG tablet Take 5-10 mg by mouth daily before breakfast. 10 mg every morning and 5 mg every evening   Yes [provider]  insulin glargine (LANTUS) 100 UNIT/ML injection Inject 28 Units into the skin at bedtime.    Yes [provider]  levothyroxine (SYNTHROID, LEVOTHROID) 50 MCG tablet Take 50 mcg by mouth daily before breakfast.   04/27/13  Yes [provider]  lidocaine (LIDODERM) 5 % Place 1 patch onto the skin daily. Remove & Discard patch within 12 hours or as directed by MD 01/21/17  Yes Joy, Shawn C, PA-C  losartan (COZAAR) 100 MG tablet Take 50 mg by mouth 2 (two) times daily.    Yes [provider]  methocarbamol (ROBAXIN) 500 MG tablet Take 1 tablet (500 mg total) by mouth 2 (two) times daily. 01/26/17  Yes Maczis, Barth Kirks, PA-C  methylPREDNISolone (MEDROL DOSEPAK) 4 MG TBPK tablet Take 4-24 mg See admin instructions by mouth. Pt started on 01-29-17 for 6 day therapy. Finished 02-05-17   Yes [provider]  montelukast (SINGULAIR) 10 MG tablet Take 10 mg by mouth daily after breakfast.    Yes [provider]  ONE TOUCH ULTRA TEST test strip  04/03/13  Yes [provider]  pantoprazole (PROTONIX) 40 MG tablet Take 40 mg by mouth 2 (two) times daily.    Yes [provider]  Polyvinyl Alcohol-Povidone (MURINE TEARS FOR DRY EYES OP) Place 1 drop into both eyes daily as needed (dry eyes).    Yes [provider]  PROAIR HFA 108 (90 BASE) MCG/ACT inhaler Inhale 2 puffs into the lungs every 6 (six) hours as needed for wheezing or shortness of breath.  04/19/13  Yes [provider]  triamcinolone cream (KENALOG) 0.1 % Apply 1 application topically daily as needed (skin irritation).  09/18/13  Yes [provider]  promethazine (PHENERGAN) 25 MG tablet Take 1 tablet (25 mg total) by mouth every 6 (six) hours as needed for nausea. Patient not taking: Reported on 01/26/2017 10/07/16   Veryl Speak, MD    Family History Family History  Problem Relation Age of Onset  . Hypertension Mother   . Diabetes Mother   . Hypertension Father   . Diabetes Father   . Heart disease Father   . Prostate cancer Father   . Diabetes Sister   . Heart disease Brother   . Prostate cancer Brother   . Lung cancer Cousin   . Colon cancer Neg Hx   . Stomach cancer Neg  Hx   . Esophageal cancer Neg Hx   . Pancreatic cancer Neg Hx   . Liver disease Neg Hx     Social History Social History   Tobacco Use  . Smoking status: Former Smoker    Last attempt to quit: 03/01/1974    Years since quitting: 42.9  . Smokeless tobacco: Never Used  Substance Use Topics  . Alcohol use: Yes    Alcohol/week: 0.0  oz    Comment: rare  . Drug use: No     Allergies   Aspirin; Ciprofloxacin; Codeine; Nitrofuran derivatives; and Penicillins   Review of Systems Review of Systems  Constitutional: Negative for fever.  Gastrointestinal: Negative for abdominal pain.       No bowel incontinence  Genitourinary: Negative for difficulty urinating.  Musculoskeletal: Positive for back pain.  Neurological: Negative for weakness and numbness.  All other systems reviewed and are negative.    Physical Exam Updated Vital Signs BP (!) 147/89   Pulse 60   Temp 98 F (36.7 C) (Oral)   Resp 18   SpO2 100%   Physical Exam  Constitutional: She appears well-developed and well-nourished. No distress.  HENT:  Head: Normocephalic and atraumatic.  Eyes: Conjunctivae are normal.  Neck: Normal range of motion. Neck supple.  Cardiovascular: Normal rate and intact distal pulses.  Pulmonary/Chest: Effort normal.  Abdominal: Soft. Bowel sounds are normal. There is no tenderness.  Musculoskeletal:  No midline spinal or paraspinal tenderness, no bony step-offs, no gross deformities.  TTP in right gluteal region.  Hips with normal range of motion.  Neurological: She is alert.  Motor:  Normal tone. 5/5 in upper and lower extremities bilaterally including strong and equal grip strength and dorsiflexion/plantar flexion Sensory: Pinprick and light touch normal in all extremities.  Deep Tendon Reflexes: 2+ and symmetric in the biceps and patella Cerebellar: normal finger-to-nose with bilateral upper extremities Gait: gait favoring right, normal balance CV: distal pulses palpable  throughout    Skin: Skin is warm.  Psychiatric: She has a normal mood and affect. Her behavior is normal.  Nursing note and vitals reviewed.    ED Treatments / Results  Labs (all labs ordered are listed, but only abnormal results are displayed) Labs Reviewed - No data to display  EKG  EKG Interpretation None       Radiology No results found.  Procedures Procedures (including critical care time)  Medications Ordered in ED Medications  ketorolac (TORADOL) 30 MG/ML injection 15 mg (15 mg Intramuscular Given 02/05/17 1239)  methocarbamol (ROBAXIN) tablet 500 mg (500 mg Oral Given 02/05/17 1239)     Initial Impression / Assessment and Plan / ED Course  I have reviewed the triage vital signs and the nursing notes.  Pertinent labs & imaging results that were available during my care of the patient were reviewed by me and considered in my medical decision making (see chart for details).     Patient with subsequent visit from R-sided back pain, likely d/t sciatica.  Pt followed by Dr. Percell Miller with ortho, and has a follow up appointment in 1 week. No neurological deficits and normal neuro exam.  Patient can walk but states is painful.  No loss of bowel or bladder control.  No concern for cauda equina.  No fever, night sweats, weight loss, h/o cancer, IVDU. Pain medication given in ED. Recommend OTC anti-inflammatories, pt can continue taking robaxin, and recommended topical ice or heat for pain. Pt to follow up with Dr. Percell Miller. Pt is safe for discharge.  Patient discussed with and seen by Dr. Leonette Monarch.  Discussed results, findings, treatment and follow up. Patient advised of return precautions. Patient verbalized understanding and agreed with plan.   Final Clinical Impressions(s) / ED Diagnoses   Final diagnoses:  Acute right-sided low back pain with right-sided sciatica    ED Discharge Orders    None       Robinson, [place] N, [place] 02/05/17 1325  Fatima Blank, MD 02/10/17 [phone]

## 2017-02-05 NOTE — ED Triage Notes (Signed)
Pt is here with right lower back and hip pain and states started in August.  Pt has had several ED visits and states she knows she has a pinched nerve.

## 2017-02-05 NOTE — Discharge Instructions (Signed)
Please read instructions below.   You can take continue taking robaxin as previously prescribed for muscle spasm/pain. You can take advil/ibuprofen every 6 hours as needed for pain.  Apply ice to your back/buttock for 20 minutes at a time. You can also apply heat. Avoid heavy lifting or strenuous activity to prevent new injuries or worsening symptoms. Attend your follow up appointment with Dr. Percell Miller. Return to ER if new numbness or tingling in your arms or legs, inability to urinate, inability to hold your bowels, or weakness in your extremities.

## 2017-02-05 NOTE — ED Provider Notes (Signed)
Medical screening examination/treatment/procedure(s) were conducted as a shared visit with non-physician practitioner(s) and myself.  I personally evaluated the patient during the encounter. Briefly, the patient is a 76 y.o. female with a history of sciatica and muscle strain here for exacerbation of her chronic lower back pain.  Treated symptomatically Toradol and muscle relaxer.  No evidence historical or physical exam evidence concerning for cauda equina. The patient is safe for discharge with strict return precautions. .    EKG Interpretation None           Cardama, Grayce Sessions, MD 02/05/17 1257

## 2017-02-12 ENCOUNTER — Other Ambulatory Visit: Payer: Self-pay | Admitting: Orthopedic Surgery

## 2017-02-12 DIAGNOSIS — M545 Low back pain: Secondary | ICD-10-CM

## 2017-02-26 ENCOUNTER — Other Ambulatory Visit: Payer: Medicare Other

## 2017-03-01 ENCOUNTER — Ambulatory Visit
Admission: RE | Admit: 2017-03-01 | Discharge: 2017-03-01 | Disposition: A | Discharge status: Home / Self Care | Payer: Medicare Other | Source: Ambulatory Visit | Attending: Orthopedic Surgery | Admitting: Orthopedic Surgery

## 2017-03-01 DIAGNOSIS — M545 Low back pain: Secondary | ICD-10-CM

## 2017-03-07 ENCOUNTER — Other Ambulatory Visit: Payer: Medicare Other

## 2017-03-17 ENCOUNTER — Ambulatory Visit (INDEPENDENT_AMBULATORY_CARE_PROVIDER_SITE_OTHER): Payer: Medicare Other | Admitting: Obstetrics & Gynecology

## 2017-03-17 ENCOUNTER — Encounter: Payer: Self-pay | Admitting: Obstetrics & Gynecology

## 2017-03-17 VITALS — BP 132/70 | Ht 62.0 in | Wt 198.0 lb

## 2017-03-17 DIAGNOSIS — Z780 Asymptomatic menopausal state: Secondary | ICD-10-CM

## 2017-03-17 DIAGNOSIS — Z90710 Acquired absence of both cervix and uterus: Secondary | ICD-10-CM | POA: Diagnosis not present

## 2017-03-17 DIAGNOSIS — Z01411 Encounter for gynecological examination (general) (routine) with abnormal findings: Secondary | ICD-10-CM

## 2017-03-17 NOTE — Progress Notes (Signed)
Jaime Boone [birth year] [id number]   History:    76 y.o. [id number]  RP:  Established patient presenting for annual gyn exam   HPI:  Menopause, well on no HRT.  S/P Hysterectomy.  No pelvic pain.  Breasts wnl.  Urine and bowel movements normal.  Past medical history,surgical history, family history and social history were all reviewed and documented in the EPIC chart.  Gynecologic History No LMP recorded. Patient has had a hysterectomy. Contraception: status post hysterectomy Last Pap: 2016. Results were: normal Last mammogram: 2018. Results were: normal Bone Density normal 03/2016 [place] 2008, will repeat this year  Obstetric History OB History  Gravida Para Term Preterm AB Living  6 3 3   3 3   SAB TAB Ectopic Multiple Live Births  3       3    # Outcome Date GA Lbr Len/2nd Weight Sex Delivery Anes PTL Lv  6 SAB           5 SAB           4 SAB           3 Term     F Vag-Spont  N LIV  2 Term     F Vag-Spont  N LIV  1 Term     M Vag-Spont  N LIV       ROS: A ROS was performed and pertinent positives and negatives are included in the history.  GENERAL: No fevers or chills. HEENT: No change in vision, no earache, sore throat or sinus congestion. NECK: No pain or stiffness. CARDIOVASCULAR: No chest pain or pressure. No palpitations. PULMONARY: No shortness of breath, cough or wheeze. GASTROINTESTINAL: No abdominal pain, nausea, vomiting or diarrhea, melena or bright red blood per rectum. GENITOURINARY: No urinary frequency, urgency, hesitancy or dysuria. MUSCULOSKELETAL: No joint or muscle pain, no back pain, no recent trauma. DERMATOLOGIC: No rash, no itching, no lesions. ENDOCRINE: No polyuria, polydipsia, no heat or cold intolerance. No recent change in weight. HEMATOLOGICAL: No anemia or easy bruising or bleeding. NEUROLOGIC: No headache, seizures, numbness, tingling or weakness. PSYCHIATRIC: No depression, no loss of interest in normal activity or change in sleep pattern.      Exam:   BP 132/70   Ht 5\' 2"  (1.575 m)   Wt 198 lb (89.8 kg)   BMI 36.21 kg/m   Body mass index is 36.21 kg/m.  General appearance : Well developed well nourished female. No acute distress HEENT: Eyes: no retinal hemorrhage or exudates,  Neck supple, trachea midline, no carotid bruits, no thyroidmegaly Lungs: Clear to auscultation, no rhonchi or wheezes, or rib retractions  Heart: Regular rate and rhythm, no murmurs or gallops Breast:Examined in sitting and supine position were symmetrical in appearance, no palpable masses or tenderness,  no skin retraction, no nipple inversion, no nipple discharge, no skin discoloration, no axillary or supraclavicular lymphadenopathy Abdomen: no palpable masses or tenderness, no rebound or guarding Extremities: no edema or skin discoloration or tenderness  Pelvic: Vulva normal  Bartholin, Urethra, Skene Glands: Within normal limits             Vagina: No gross lesions or discharge  Cervix/Uterus absent  Adnexa  Without masses or tenderness  Anus and perineum  normal    Assessment/Plan:  76 y.o. female for annual exam  1. Encounter for gynecological examination with abnormal finding Gynecologic exam status post hysterectomy.  Pap test normal in 2016.  Will repeat at 3-5 years.  Breast exam  normal.  Screening mammogram negative in 2018.  Colonoscopy done in 2008, will repeat this year.  Health labs with family physician.  2. H/O total hysterectomy   3. Menopause present Well on no hormone replacement therapy.  Status post hysterectomy.  Recommend vitamin D supplements, calcium rich nutrition and regular weightbearing physical activity.  Recent bone density was normal in 2018.  [name] MD, 10:37 AM 03/17/2017

## 2017-03-21 ENCOUNTER — Encounter: Payer: Self-pay | Admitting: Obstetrics & Gynecology

## 2017-03-21 NOTE — Patient Instructions (Signed)
1. Encounter for gynecological examination with abnormal finding Gynecologic exam status post hysterectomy.  Pap test normal in 2016.  Will repeat at 3-5 years.  Breast exam normal.  Screening mammogram negative in 2018.  Colonoscopy done in 2008, will repeat this year.  Health labs with family physician.  2. H/O total hysterectomy   3. Menopause present Well on no hormone replacement therapy.  Status post hysterectomy.  Recommend vitamin D supplements, calcium rich nutrition and regular weightbearing physical activity.  Recent bone density was normal in 2018.  Raschelle, it was a pleasure meeting you today!   Health Maintenance for Postmenopausal Women Menopause is a normal process in which your reproductive ability comes to an end. This process happens gradually over a span of months to years, usually between the ages of 51 and 68. Menopause is complete when you have missed 12 consecutive menstrual periods. It is important to talk with your health care provider about some of the most common conditions that affect postmenopausal women, such as heart disease, cancer, and bone loss (osteoporosis). Adopting a healthy lifestyle and getting preventive care can help to promote your health and wellness. Those actions can also lower your chances of developing some of these common conditions. What should I know about menopause? During menopause, you may experience a number of symptoms, such as:  Moderate-to-severe hot flashes.  Night sweats.  Decrease in sex drive.  Mood swings.  Headaches.  Tiredness.  Irritability.  Memory problems.  Insomnia.  Choosing to treat or not to treat menopausal changes is an individual decision that you make with your health care provider. What should I know about hormone replacement therapy and supplements? Hormone therapy products are effective for treating symptoms that are associated with menopause, such as hot flashes and night sweats. Hormone replacement  carries certain risks, especially as you become older. If you are thinking about using estrogen or estrogen with progestin treatments, discuss the benefits and risks with your health care provider. What should I know about heart disease and stroke? Heart disease, heart attack, and stroke become more likely as you age. This may be due, in part, to the hormonal changes that your body experiences during menopause. These can affect how your body processes dietary fats, triglycerides, and cholesterol. Heart attack and stroke are both medical emergencies. There are many things that you can do to help prevent heart disease and stroke:  Have your blood pressure checked at least every 1-2 years. High blood pressure causes heart disease and increases the risk of stroke.  If you are 51-58 years old, ask your health care provider if you should take aspirin to prevent a heart attack or a stroke.  Do not use any tobacco products, including cigarettes, chewing tobacco, or electronic cigarettes. If you need help quitting, ask your health care provider.  It is important to eat a healthy diet and maintain a healthy weight. ? Be sure to include plenty of vegetables, fruits, low-fat dairy products, and lean protein. ? Avoid eating foods that are high in solid fats, added sugars, or salt (sodium).  Get regular exercise. This is one of the most important things that you can do for your health. ? Try to exercise for at least 150 minutes each week. The type of exercise that you do should increase your heart rate and make you sweat. This is known as moderate-intensity exercise. ? Try to do strengthening exercises at least twice each week. Do these in addition to the moderate-intensity exercise.  Know  your numbers.Ask your health care provider to check your cholesterol and your blood glucose. Continue to have your blood tested as directed by your health care provider.  What should I know about cancer screening? There  are several types of cancer. Take the following steps to reduce your risk and to catch any cancer development as early as possible. Breast Cancer  Practice breast self-awareness. ? This means understanding how your breasts normally appear and feel. ? It also means doing regular breast self-exams. Let your health care provider know about any changes, no matter how small.  If you are 41 or older, have a clinician do a breast exam (clinical breast exam or CBE) every year. Depending on your age, family history, and medical history, it may be recommended that you also have a yearly breast X-ray (mammogram).  If you have a family history of breast cancer, talk with your health care provider about genetic screening.  If you are at high risk for breast cancer, talk with your health care provider about having an MRI and a mammogram every year.  Breast cancer (BRCA) gene test is recommended for women who have family members with BRCA-related cancers. Results of the assessment will determine the need for genetic counseling and BRCA1 and for BRCA2 testing. BRCA-related cancers include these types: ? Breast. This occurs in males or females. ? Ovarian. ? Tubal. This may also be called fallopian tube cancer. ? Cancer of the abdominal or pelvic lining (peritoneal cancer). ? Prostate. ? Pancreatic.  Cervical, Uterine, and Ovarian Cancer Your health care provider may recommend that you be screened regularly for cancer of the pelvic organs. These include your ovaries, uterus, and vagina. This screening involves a pelvic exam, which includes checking for microscopic changes to the surface of your cervix (Pap test).  For women ages 21-65, health care providers may recommend a pelvic exam and a Pap test every three years. For women ages 56-65, they may recommend the Pap test and pelvic exam, combined with testing for human papilloma virus (HPV), every five years. Some types of HPV increase your risk of cervical  cancer. Testing for HPV may also be done on women of any age who have unclear Pap test results.  Other health care providers may not recommend any screening for nonpregnant women who are considered low risk for pelvic cancer and have no symptoms. Ask your health care provider if a screening pelvic exam is right for you.  If you have had past treatment for cervical cancer or a condition that could lead to cancer, you need Pap tests and screening for cancer for at least 20 years after your treatment. If Pap tests have been discontinued for you, your risk factors (such as having a new sexual partner) need to be reassessed to determine if you should start having screenings again. Some women have medical problems that increase the chance of getting cervical cancer. In these cases, your health care provider may recommend that you have screening and Pap tests more often.  If you have a family history of uterine cancer or ovarian cancer, talk with your health care provider about genetic screening.  If you have vaginal bleeding after reaching menopause, tell your health care provider.  There are currently no reliable tests available to screen for ovarian cancer.  Lung Cancer Lung cancer screening is recommended for adults 65-57 years old who are at high risk for lung cancer because of a history of smoking. A yearly low-dose CT scan of the lungs  is recommended if you:  Currently smoke.  Have a history of at least 30 pack-years of smoking and you currently smoke or have quit within the past 15 years. A pack-year is smoking an average of one pack of cigarettes per day for one year.  Yearly screening should:  Continue until it has been 15 years since you quit.  Stop if you develop a health problem that would prevent you from having lung cancer treatment.  Colorectal Cancer  This type of cancer can be detected and can often be prevented.  Routine colorectal cancer screening usually begins at age 55  and continues through age 29.  If you have risk factors for colon cancer, your health care provider may recommend that you be screened at an earlier age.  If you have a family history of colorectal cancer, talk with your health care provider about genetic screening.  Your health care provider may also recommend using home test kits to check for hidden blood in your stool.  A small camera at the end of a tube can be used to examine your colon directly (sigmoidoscopy or colonoscopy). This is done to check for the earliest forms of colorectal cancer.  Direct examination of the colon should be repeated every 5-10 years until age 100. However, if early forms of precancerous polyps or small growths are found or if you have a family history or genetic risk for colorectal cancer, you may need to be screened more often.  Skin Cancer  Check your skin from head to toe regularly.  Monitor any moles. Be sure to tell your health care provider: ? About any new moles or changes in moles, especially if there is a change in a mole's shape or color. ? If you have a mole that is larger than the size of a pencil eraser.  If any of your family members has a history of skin cancer, especially at a young age, talk with your health care provider about genetic screening.  Always use sunscreen. Apply sunscreen liberally and repeatedly throughout the day.  Whenever you are outside, protect yourself by wearing long sleeves, pants, a wide-brimmed hat, and sunglasses.  What should I know about osteoporosis? Osteoporosis is a condition in which bone destruction happens more quickly than new bone creation. After menopause, you may be at an increased risk for osteoporosis. To help prevent osteoporosis or the bone fractures that can happen because of osteoporosis, the following is recommended:  If you are 43-16 years old, get at least 1,000 mg of calcium and at least 600 mg of vitamin D per day.  If you are older than  age 65 but younger than age 24, get at least 1,200 mg of calcium and at least 600 mg of vitamin D per day.  If you are older than age 3, get at least 1,200 mg of calcium and at least 800 mg of vitamin D per day.  Smoking and excessive alcohol intake increase the risk of osteoporosis. Eat foods that are rich in calcium and vitamin D, and do weight-bearing exercises several times each week as directed by your health care provider. What should I know about how menopause affects my mental health? Depression may occur at any age, but it is more common as you become older. Common symptoms of depression include:  Low or sad mood.  Changes in sleep patterns.  Changes in appetite or eating patterns.  Feeling an overall lack of motivation or enjoyment of activities that you previously enjoyed.  Frequent crying spells.  Talk with your health care provider if you think that you are experiencing depression. What should I know about immunizations? It is important that you get and maintain your immunizations. These include:  Tetanus, diphtheria, and pertussis (Tdap) booster vaccine.  Influenza every year before the flu season begins.  Pneumonia vaccine.  Shingles vaccine.  Your health care provider may also recommend other immunizations. This information is not intended to replace advice given to you by your health care provider. Make sure you discuss any questions you have with your health care provider. Document Released: 05/08/2005 Document Revised: 10/04/2015 Document Reviewed: 12/18/2014 Elsevier Interactive Patient Education  2018 [employer].

## 2017-03-27 ENCOUNTER — Ambulatory Visit
Admission: RE | Admit: 2017-03-27 | Discharge: 2017-03-27 | Disposition: A | Discharge status: Home / Self Care | Payer: Medicare Other | Source: Ambulatory Visit | Attending: Orthopedic Surgery | Admitting: Orthopedic Surgery

## 2017-04-15 ENCOUNTER — Ambulatory Visit: Payer: Medicare Other | Attending: Internal Medicine | Admitting: Physical Therapy

## 2017-04-15 ENCOUNTER — Other Ambulatory Visit: Payer: Self-pay

## 2017-04-15 ENCOUNTER — Encounter: Payer: Self-pay | Admitting: Physical Therapy

## 2017-04-15 DIAGNOSIS — G8929 Other chronic pain: Secondary | ICD-10-CM | POA: Insufficient documentation

## 2017-04-15 DIAGNOSIS — R293 Abnormal posture: Secondary | ICD-10-CM | POA: Diagnosis not present

## 2017-04-15 DIAGNOSIS — R269 Unspecified abnormalities of gait and mobility: Secondary | ICD-10-CM | POA: Insufficient documentation

## 2017-04-15 DIAGNOSIS — M6281 Muscle weakness (generalized): Secondary | ICD-10-CM | POA: Diagnosis present

## 2017-04-15 DIAGNOSIS — M545 Low back pain: Secondary | ICD-10-CM | POA: Diagnosis present

## 2017-04-15 NOTE — Therapy (Signed)
[facility] [place], [place], [address] Phone: [phone]   Fax:  [phone]  Physical Therapy Evaluation  Patient Details  Name: Jaime Boone MRN: [id number] Date of Birth: 02-07-1941 Referring Provider: Laroy Apple MD   Encounter Date: 04/15/2017  PT End of Session - 04/15/17 1412    Visit Number  1    Number of Visits  13    Date for PT Re-Evaluation  05/27/17    PT Start Time  1410    PT Stop Time  1455    PT Time Calculation (min)  45 min    Activity Tolerance  Patient tolerated treatment well    Behavior During Therapy  [facility] for tasks assessed/performed       Past Medical History:  Diagnosis Date  . Acid reflux   . Arthritis   . Asthma   . Cirrhosis ([place])   . 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 Degenerative joint disease   . Diabetes mellitus without complication ([place])   . Hepatitis C   . Hypertension   . Sleep apnea     Past Surgical History:  Procedure Laterality Date  . ABDOMINAL HYSTERECTOMY     TAH  . APPENDECTOMY    . DILATION AND CURETTAGE OF UTERUS    . EYE SURGERY Left    CATARACT  . FOOT SURGERY Bilateral    BUNIONECTOMY  . GALLBLADDER SURGERY    . keiloid removal      ear  . NOSE SURGERY    . oral stone extraction      There were no vitals filed for this visit.   Subjective Assessment - 04/15/17 1412    Subjective  pt is a 77 y.o F with CC of low back pain R>L that started in August of 2018 which she report started with non-specific onset. pain is in the low back with the R side worse than the L, with referred pain to the R knee. She reports since onset she states it does feel like it has improved a little. she denies any red flags as welll as N/T.     Limitations  Sitting;Walking;Standing    How long can you sit comfortably?  30 min    How long can you stand comfortably?  15 min    How long can you walk comfortably?  15 min    Diagnostic tests  MRI on 03/27/2017    Patient Stated Goals  to decrease pain, improve motion, know what to do to calm pain if it returns.     Currently in Pain?  Yes    Pain Score  5  At worst 10/10    Pain Location  Back    Pain Orientation  Lower;Right    Pain Descriptors / Indicators  Spasm;Tightness;Aching;Sore    Pain Type  Chronic pain    Pain Radiating Towards  to the R knee    Pain Onset  More than a month ago    Pain Frequency  Constant    Aggravating Factors   prolonged standing/ walking, bending forward    Pain Relieving Factors  sitting in bath tub in warm water    Effect of Pain on Daily Activities  limited endurance walking/ standing.          [facility] PT Assessment - 04/15/17 1405      Assessment   Medical Diagnosis  LBP and spondylosis    Referring Provider  Lake Bells  Ibazebo MD    Onset Date/Surgical Date  -- august 2018    Hand Dominance  Right    Prior Therapy  yes for back and knee      Precautions   Precautions  None      Restrictions   Weight Bearing Restrictions  No      Balance Screen   Has the patient fallen in the past 6 months  No    Has the patient had a decrease in activity level because of a fear of falling?   No    Is the patient reluctant to leave their home because of a fear of falling?   No      Home Environment   Living Environment  Private residence    Living Arrangements  Alone    Type of [place] Access  Elevator;Level entry    [place]  One level      Prior Function   Level of Independence  Independent with basic ADLs    Vocation  On disability      Observation/Other Assessments   Focus on Therapeutic Outcomes (FOTO)   62% limited predicted 47% limited      Posture/Postural Control   Posture/Postural Control  Postural limitations    Postural Limitations  Rounded Shoulders;Forward head      ROM / Strength   AROM / PROM / Strength  AROM;Strength      AROM   Overall AROM Comments  pt utilizes mostly thoracic  mobility to mov    AROM Assessment Site  Lumbar    Lumbar Flexion  10    Lumbar Extension  10    Lumbar - Right Side Bend  8    Lumbar - Left Side Bend  10      Strength   Strength Assessment Site  Hip;Knee    Right/Left Hip  Right;Left    Right Hip Flexion  4-/5    Right Hip Extension  3/5    Right Hip ABduction  3+/5    Right Hip ADduction  3+/5    Left Hip Flexion  4-/5    Left Hip Extension  3/5    Left Hip ABduction  3+/5    Left Hip ADduction  3+/5    Right/Left Knee  Right;Left    Right Knee Flexion  4-/5 pain in the back    Right Knee Extension  4-/5    Left Knee Flexion  4/5    Left Knee Extension  4/5      Palpation   Palpation comment  TTP along R lumbar paraspinals and tenderness at the R PSIS      Special Tests    Special Tests  Lumbar      Ambulation/Gait   Ambulation/Gait  Yes    Gait Pattern  Step-through pattern;Decreased stride length;Antalgic;Trendelenburg;Trunk flexed             Objective measurements completed on examination: See above findings.      [facility] PT Treatment/Exercise - 04/15/17 1405      Lumbar Exercises: Stretches   Active Hamstring Stretch  3 reps;30 seconds PNF contract/ relax with 10 sec contraction    Lower Trunk Rotation  -- 1 x 10      Lumbar Exercises: Supine   Bent Knee Raise  10 reps whil keeping core tight    Other Supine Lumbar Exercises  SLR 2 x 10 RLE only      Manual Therapy  Manual Therapy  Muscle Energy Technique    Muscle Energy Technique  resisted R hip flexion 5 x 10 sec hold pt fatigues very quickly             PT Education - 04/15/17 1455    Education provided  Yes    Education Details  evaluation findings, POC, goals, HEP wiht proper form/ rationale, anatomy of area involved and benefits of treatment.     Person(s) Educated  Patient    Methods  Explanation;Verbal cues;Handout    Comprehension  Verbalized understanding;Verbal cues required       PT Short Term Goals - 04/15/17  1501      PT SHORT TERM GOAL #1   Title  She will be independent with inital HEP    Baseline  -    Time  3    Period  Weeks    Status  New    Target Date  05/06/17      PT SHORT TERM GOAL #2   Title  pt to verbalize/ demo proper posture in sitting/ standing activites and demonstrate proper lifting using appropriate mechanics to prevent and reduce low back pain.     Baseline  -    Time  3    Period  Weeks    Status  New    Target Date  05/06/17      PT SHORT TERM GOAL #3   Title  reduce lumbr paraspinal spasm to reduce pain to </= 4/10 pain and promote trunk mobilty     Baseline  -    Time  3    Period  Weeks    Status  New    Target Date  05/06/17        PT Long Term Goals - 04/15/17 1503      PT LONG TERM GOAL #1   Title  pt to improve trunk mobility by >/=20 degress for flexion, 10 degrees for extension and bil side bending with </= 2/10 pain to promote functional mobility for ADLs    Time  6    Period  Weeks    Status  New    Target Date  05/27/17      PT LONG TERM GOAL #2   Title  pt will increase bil strenght to >4/5 to assist walking endurance and safety and symmatry to reduce low back pain    Baseline  -    Time  6    Period  Weeks    Status  New    Target Date  05/27/17      PT LONG TERM GOAL #3   Title  pt to be able to sit / stand and walk for >/= 30 min to promote functional endurance required for community ambulation with LRAD    Baseline  -    Time  6    Period  Weeks    Status  New    Target Date  05/27/17      PT LONG TERM GOAL #4   Title  increase FOTO score to </= 47% limited to demo improvement in function    Time  6    Period  Weeks    Status  New    Target Date  05/27/17      PT LONG TERM GOAL #5   Title  pt to be I with all HEP given as of last visit    Baseline  -    Time  6  Period  Weeks    Status  New    Target Date  05/27/17             Plan - 04/15/17 1456    Clinical Impression Statement  pt presents to OPPT  with CC of low back pain with R>L that started back in August of 2018 with no-specific MOI. limited trunk mobility which she utilizes thoracic region for primary trunk motion. limited strength in bil LE with R>L and TTP along bil lumbar paraspinals with signifcant soreness at the R PSIS. Special testing is consistent with high likelihood of R posteriorly rotated innominate. She would benefit from physical therapy to improve trunk mobility, reduce pain, increase bil LE strength and return pt to PLOF by addressing the deficits listed .     Clinical Presentation  Stable    Clinical Presentation due to:  low due to pt improving with pain    Clinical Decision Making  Low    Rehab Potential  Good    PT Frequency  2x / week    PT Duration  6 weeks    PT Treatment/Interventions  ADLs/Self Care Home Management;Cryotherapy;Electrical Stimulation;Iontophoresis '4mg'$ /ml Dexamethasone;Moist Heat;Ultrasound;Gait training;Stair training;Functional mobility training;Therapeutic activities;Therapeutic exercise;Manual techniques;Taping;Dry needling;Patient/family education;Neuromuscular re-education;Balance training    PT Next Visit Plan  review and update HEP: posterior innominate rotation on R. hamstring stretching and R hip flexor MET, hip distraction mobs, hip / core strength, modalities PRN    PT Home Exercise Plan  hamstring stretching, supine marching, SLR, lower trunk rotation    Consulted and Agree with Plan of Care  Patient       Patient will benefit from skilled therapeutic intervention in order to improve the following deficits and impairments:  Abnormal gait, Pain, Improper body mechanics, Postural dysfunction, Decreased range of motion, Decreased mobility, Decreased strength, Decreased endurance, Decreased activity tolerance, Decreased balance, Increased muscle spasms  Visit Diagnosis: Chronic right-sided low back pain, with sciatica presence unspecified  Muscle weakness (generalized)  Abnormal  posture  Abnormal gait     Problem List Patient Active Problem List   Diagnosis Date Noted  . Acute hepatitis 10/12/2016  . Hypertension 10/12/2016  . Sleep apnea 10/12/2016  . Type 2 diabetes mellitus ([place]) 10/12/2016  . Menopause 03/16/2016  . Keratosis 04/17/2015  . Vaginal lesion 03/14/2015  . Sebaceous cyst of labia 04/06/2012  . History of hepatitis C 04/06/2012  . Constipation 04/06/2012  . Postmenopausal 03/01/2012  . Vaginal atrophy 03/01/2012   Starr Lake PT, DPT, LAT, ATC  04/15/17  3:15 PM      [facility] [address] [place], [place], [address] Phone: [phone]   Fax:  [phone]  Name: Jaime Boone MRN: [id number] Date of Birth: September 15, 1940

## 2017-04-19 ENCOUNTER — Other Ambulatory Visit: Payer: Self-pay | Admitting: Nurse Practitioner

## 2017-04-19 DIAGNOSIS — K760 Fatty (change of) liver, not elsewhere classified: Secondary | ICD-10-CM

## 2017-04-19 DIAGNOSIS — K7400 Hepatic fibrosis, unspecified: Secondary | ICD-10-CM

## 2017-04-21 ENCOUNTER — Encounter: Payer: Self-pay | Admitting: Physical Therapy

## 2017-04-21 ENCOUNTER — Ambulatory Visit: Payer: Medicare Other | Admitting: Physical Therapy

## 2017-04-21 DIAGNOSIS — M545 Low back pain: Principal | ICD-10-CM

## 2017-04-21 DIAGNOSIS — R293 Abnormal posture: Secondary | ICD-10-CM

## 2017-04-21 DIAGNOSIS — R269 Unspecified abnormalities of gait and mobility: Secondary | ICD-10-CM

## 2017-04-21 DIAGNOSIS — G8929 Other chronic pain: Secondary | ICD-10-CM

## 2017-04-21 DIAGNOSIS — M6281 Muscle weakness (generalized): Secondary | ICD-10-CM

## 2017-04-21 NOTE — Patient Instructions (Addendum)
Pelvic Rotation: Contract / Relax (Supine)    Hands against right knee, resist bent leg moving toward head. Press straight leg down. Hold _5___ seconds. Relax. Repeat _5___ times per set. Do _5___ sets per session. Do __as needed__ sessions per day.  http://orth.exer.us/277   Copyright  VHI. All rights reserved.  Issued from Ex Drawer:  QL info sheet anatomy.  How to scoot hips for sit to stand.

## 2017-04-21 NOTE — Therapy (Signed)
[place], [place], [address] Phone: [phone]   Fax:  [phone]  Physical Therapy Treatment  Patient Details  Name: Jaime Boone MRN: [id number] Date of Birth: 11/06/1940 Referring Provider: Laroy Apple MD   Encounter Date: 04/21/2017  PT End of Session - 04/21/17 0849    Visit Number  2    Number of Visits  13    Date for PT Re-Evaluation  05/27/17    PT Start Time  0807    PT Stop Time  0904    PT Time Calculation (min)  57 min    Activity Tolerance  Patient tolerated treatment well    Behavior During Therapy  [facility] for tasks assessed/performed       Past Medical History:  Diagnosis Date  . Acid reflux   . Arthritis   . Asthma   . Cirrhosis ([place])   . 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 Degenerative joint disease   . Diabetes mellitus without complication ([place])   . Hepatitis C   . Hypertension   . Sleep apnea     Past Surgical History:  Procedure Laterality Date  . ABDOMINAL HYSTERECTOMY     TAH  . APPENDECTOMY    . DILATION AND CURETTAGE OF UTERUS    . EYE SURGERY Left    CATARACT  . FOOT SURGERY Bilateral    BUNIONECTOMY  . GALLBLADDER SURGERY    . keiloid removal      ear  . NOSE SURGERY    . oral stone extraction      There were no vitals filed for this visit.  Subjective Assessment - 04/21/17 0813    Subjective  Able to some of the exercises.  8/10 pain this morning.  Right leg weak.     Currently in Pain?  Yes    Pain Score  8     Pain Location  Back    Pain Orientation  Right;Left;Lower    Pain Descriptors / Indicators  Spasm;Tightness;Sore    Pain Type  Chronic pain    Pain Radiating Towards  to knee    Pain Frequency  Constant    Aggravating Factors   drying legs,  bending,  walking    Pain Relieving Factors  warm tub,  cane    Effect of Pain on Daily Activities  Limited sleep,  walking                       [facility] Adult PT Treatment/Exercise - 04/21/17 0001      Self-Care   Self-Care  -- sit to stand technique for QL      Lumbar Exercises: Stretches   Active Hamstring Stretch  3 reps;30 seconds right    Single Knee to Chest Stretch  3 reps;30 seconds    Lower Trunk Rotation  -- 10 X    Hip Flexor Stretch  3 reps;30 seconds    Other Lumbar Stretch Exercise  QL stretch right    Other Lumbar Stretch Exercise  quad stretch 3 X 30 seconds right      Lumbar Exercises: Supine   Bent Knee Raise  20 reps right    Straight Leg Raise  20 reps AA right    Other Supine Lumbar Exercises  Isometric right hip muscle energy  right right, AA SLR 15 x      Moist Heat Therapy   Number Minutes Moist Heat  15 Minutes    Moist Heat Location  Lumbar Spine      Manual Therapy   Manual Therapy  Muscle Energy Technique    Muscle Energy Technique  resisted R hip flexion 5 x 10 sec hold pt fatigues very quickly             PT Education - 04/21/17 0848    Education provided  Yes    Education Details  Self care,  HEP    Person(s) Educated  Patient    Methods  Explanation;Demonstration;Tactile cues;Verbal cues;Handout    Comprehension  Verbalized understanding;Returned demonstration       PT Short Term Goals - 04/21/17 0859      PT SHORT TERM GOAL #1   Title  She will be independent with inital HEP    Baseline  cues needed,  some difficult to do    Time  3    Period  Weeks    Status  On-going      PT SHORT TERM GOAL #2   Title  pt to verbalize/ demo proper posture in sitting/ standing activites and demonstrate proper lifting using appropriate mechanics to prevent and reduce low back pain.     Time  3    Period  Weeks    Status  Unable to assess      PT SHORT TERM GOAL #3   Title  reduce lumbr paraspinal spasm to reduce pain to </= 4/10 pain and promote trunk mobilty     Baseline  6-8/10    Time  3    Status  On-going        PT Long Term Goals -  04/15/17 1503      PT LONG TERM GOAL #1   Title  pt to improve trunk mobility by >/=20 degress for flexion, 10 degrees for extension and bil side bending with </= 2/10 pain to promote functional mobility for ADLs    Time  6    Period  Weeks    Status  New    Target Date  05/27/17      PT LONG TERM GOAL #2   Title  pt will increase bil strenght to >4/5 to assist walking endurance and safety and symmatry to reduce low back pain    Baseline  -    Time  6    Period  Weeks    Status  New    Target Date  05/27/17      PT LONG TERM GOAL #3   Title  pt to be able to sit / stand and walk for >/= 30 min to promote functional endurance required for community ambulation with LRAD    Baseline  -    Time  6    Period  Weeks    Status  New    Target Date  05/27/17      PT LONG TERM GOAL #4   Title  increase FOTO score to </= 47% limited to demo improvement in function    Time  6    Period  Weeks    Status  New    Target Date  05/27/17      PT LONG TERM GOAL #5   Title  pt to be I with all HEP given as of last visit    Baseline  -    Time  6    Period  Weeks    Status  New    Target Date  05/27/17  Plan - 04/21/17 0856    Clinical Impression Statement  QL right in spasm today.  Able to decrease pain with exercise and muscle energy .  Decreased pain to discomfort prior to heat.  Bed mobility improved in speed at end of session with less guarding.    PT Next Visit Plan  review and update HEP: posterior innominate rotation on R. hamstring stretching and R hip flexor MET, hip distraction mobs, hip / core strength, modalities PRN    PT Home Exercise Plan  hamstring stretching, supine marching, SLR, lower trunk rotation,  Muscle hip contract relax right    Consulted and Agree with Plan of Care  Patient       Patient will benefit from skilled therapeutic intervention in order to improve the following deficits and impairments:     Visit Diagnosis: Chronic right-sided low  back pain, with sciatica presence unspecified  Muscle weakness (generalized)  Abnormal posture  Abnormal gait     Problem List Patient Active Problem List   Diagnosis Date Noted  . Acute hepatitis 10/12/2016  . Hypertension 10/12/2016  . Sleep apnea 10/12/2016  . Type 2 diabetes mellitus ([place]) 10/12/2016  . Menopause 03/16/2016  . Keratosis 04/17/2015  . Vaginal lesion 03/14/2015  . Sebaceous cyst of labia 04/06/2012  . History of hepatitis C 04/06/2012  . Constipation 04/06/2012  . Postmenopausal 03/01/2012  . Vaginal atrophy 03/01/2012    HARRIS,KAREN PTA 04/21/2017, 9:01 AM  [facility] [address] [place], [place], [address] Phone: [phone]   Fax:  [phone]  Name: Alphia Behanna MRN: [id number] Date of Birth: [birth year]

## 2017-04-26 ENCOUNTER — Ambulatory Visit
Admission: RE | Admit: 2017-04-26 | Discharge: 2017-04-26 | Disposition: A | Discharge status: Home / Self Care | Payer: Medicare Other | Source: Ambulatory Visit | Attending: Nurse Practitioner | Admitting: Nurse Practitioner

## 2017-04-26 DIAGNOSIS — K7400 Hepatic fibrosis, unspecified: Secondary | ICD-10-CM

## 2017-04-26 DIAGNOSIS — K760 Fatty (change of) liver, not elsewhere classified: Secondary | ICD-10-CM

## 2017-04-28 ENCOUNTER — Ambulatory Visit: Payer: Medicare Other | Admitting: Physical Therapy

## 2017-04-28 ENCOUNTER — Encounter: Payer: Self-pay | Admitting: Physical Therapy

## 2017-04-28 DIAGNOSIS — G8929 Other chronic pain: Secondary | ICD-10-CM

## 2017-04-28 DIAGNOSIS — R293 Abnormal posture: Secondary | ICD-10-CM

## 2017-04-28 DIAGNOSIS — M545 Low back pain: Secondary | ICD-10-CM | POA: Diagnosis not present

## 2017-04-28 DIAGNOSIS — R269 Unspecified abnormalities of gait and mobility: Secondary | ICD-10-CM

## 2017-04-28 DIAGNOSIS — M6281 Muscle weakness (generalized): Secondary | ICD-10-CM

## 2017-04-28 NOTE — Therapy (Signed)
[place], [place], [address] Phone: [phone]   Fax:  [phone]  Physical Therapy Treatment  Patient Details  Name: Jaime Boone MRN: [id number] Date of Birth: [birth year] Referring Provider: Laroy Apple MD   Encounter Date: 04/28/2017  PT End of Session - 04/28/17 1016    Visit Number  2    Number of Visits  13    Date for PT Re-Evaluation  05/27/17    PT Start Time  0916    PT Stop Time  1005    PT Time Calculation (min)  49 min    Activity Tolerance  Patient tolerated treatment well    Behavior During Therapy  [facility] for tasks assessed/performed       Past Medical History:  Diagnosis Date  . Acid reflux   . Arthritis   . Asthma   . Cirrhosis ([place])   . 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 Degenerative joint disease   . Diabetes mellitus without complication ([place])   . Hepatitis C   . Hypertension   . Sleep apnea     Past Surgical History:  Procedure Laterality Date  . ABDOMINAL HYSTERECTOMY     TAH  . APPENDECTOMY    . DILATION AND CURETTAGE OF UTERUS    . EYE SURGERY Left    CATARACT  . FOOT SURGERY Bilateral    BUNIONECTOMY  . GALLBLADDER SURGERY    . keiloid removal      ear  . NOSE SURGERY    . oral stone extraction      There were no vitals filed for this visit.  Subjective Assessment - 04/28/17 0914    Subjective  Had a sharp pain posterior right thigh,  bending over the wrong way.  I was in tears. The LTR seems to help.  Left pain continues  it varies.      Currently in Pain?  Yes    Pain Score  7  spike to higher    Pain Location  Back    Pain Orientation  Right;Left;Lower    Pain Radiating Towards  to knee right    Pain Frequency  Constant    Aggravating Factors   bending the wrong way  ,, lay on right side.  stretching legs out on the bed.    Pain Relieving Factors  warm tub,  cane, lidocaine patch?    Effect of Pain on Daily  Activities  limited sleep walking,  steps                      [facility] Adult PT Treatment/Exercise - 04/28/17 0001      Lumbar Exercises: Stretches   Passive Hamstring Stretch  3 reps;30 seconds    Lower Trunk Rotation  5 reps;10 seconds    Piriformis Stretch  -- gentle 3 times    Other Lumbar Stretch Exercise  QL stretch right.      Lumbar Exercises: Supine   Clam  5 reps green band,  paIN increased with, so stopped    Bent Knee Raise  20 reps left today    Straight Leg Raise  20 reps    Other Supine Lumbar Exercises    Ball squeeze 20 X      Manual Therapy   Manual therapy comments  tennis ball issued for trigger point  and soft tissue work at home.  ,  soft tissue work to right hip  and gluteak,  , strumming to QL and PNF stretching moving right hip anterior to decrease spasm.             PT Education - 04/28/17 1016    Education provided  Yes    Education Details  how to use tennis ball for soft tissue work    [employer]) Educated  Patient    Methods  Explanation;Demonstration;Tactile cues;Verbal cues    Comprehension  Verbalized understanding;Returned demonstration       PT Short Term Goals - 04/28/17 1031      PT SHORT TERM GOAL #1   Title  She will be independent with inital HEP    Baseline  cues needed to do exercises within her tolerance.    Time  3    Period  Weeks    Status  On-going      PT SHORT TERM GOAL #2   Title  pt to verbalize/ demo proper posture in sitting/ standing activites and demonstrate proper lifting using appropriate mechanics to prevent and reduce low back pain.     Time  3    Period  Weeks    Status  Unable to assess      PT SHORT TERM GOAL #3   Title  reduce lumbr paraspinal spasm to reduce pain to </= 4/10 pain and promote trunk mobilty     Baseline  less spasm at end of session,  not yet consistant.    Time  3    Period  Weeks    Status  On-going        PT Long Term Goals - 04/15/17 1503      PT LONG TERM GOAL  #1   Title  pt to improve trunk mobility by >/=20 degress for flexion, 10 degrees for extension and bil side bending with </= 2/10 pain to promote functional mobility for ADLs    Time  6    Period  Weeks    Status  New    Target Date  05/27/17      PT LONG TERM GOAL #2   Title  pt will increase bil strenght to >4/5 to assist walking endurance and safety and symmatry to reduce low back pain    Baseline  -    Time  6    Period  Weeks    Status  New    Target Date  05/27/17      PT LONG TERM GOAL #3   Title  pt to be able to sit / stand and walk for >/= 30 min to promote functional endurance required for community ambulation with LRAD    Baseline  -    Time  6    Period  Weeks    Status  New    Target Date  05/27/17      PT LONG TERM GOAL #4   Title  increase FOTO score to </= 47% limited to demo improvement in function    Time  6    Period  Weeks    Status  New    Target Date  05/27/17      PT LONG TERM GOAL #5   Title  pt to be I with all HEP given as of last visit    Baseline  -    Time  6    Period  Weeks    Status  New    Target Date  05/27/17  Plan - 04/28/17 1018    Clinical Impression Statement  QL continues with pain spikes at home.  She is doing mainly the LTR for exercise.   She has been doing things that were painful to do because she didn't want to  "give in" to the pain.  Patient had less spasm in QL at end of session with .  Progress toward STG# 2..  She is able to walk to the store,  sometimes she needs to use a shopping cart to walk across the parking lot.      PT Next Visit Plan  review and update HEP: posterior innominate rotation on R. hamstring stretching and R hip flexor MET, hip distraction mobs, hip / core strength, modalities PRN    PT Home Exercise Plan  hamstring stretching, supine marching, SLR, lower trunk rotation,  Muscle hip contract relax right    Consulted and Agree with Plan of Care  Patient       Patient will benefit from  skilled therapeutic intervention in order to improve the following deficits and impairments:     Visit Diagnosis: Chronic right-sided low back pain, with sciatica presence unspecified  Muscle weakness (generalized)  Abnormal posture  Abnormal gait  Chronic midline low back pain without sciatica     Problem List Patient Active Problem List   Diagnosis Date Noted  . Acute hepatitis 10/12/2016  . Hypertension 10/12/2016  . Sleep apnea 10/12/2016  . Type 2 diabetes mellitus ([place]) 10/12/2016  . Menopause 03/16/2016  . Keratosis 04/17/2015  . Vaginal lesion 03/14/2015  . Sebaceous cyst of labia 04/06/2012  . History of hepatitis C 04/06/2012  . Constipation 04/06/2012  . Postmenopausal 03/01/2012  . Vaginal atrophy 03/01/2012    Jaime Boone   PTA 04/28/2017, 10:33 AM  [facility] [address] [place], [place], [address] Phone: [phone]   Fax:  [phone]  Name: Jaime Boone MRN: [id number] Date of Birth: 07/27/1940

## 2017-04-28 NOTE — Patient Instructions (Signed)

## 2017-04-30 ENCOUNTER — Encounter: Payer: Self-pay | Admitting: Physical Therapy

## 2017-04-30 ENCOUNTER — Ambulatory Visit: Payer: Medicare Other | Attending: Internal Medicine | Admitting: Physical Therapy

## 2017-04-30 DIAGNOSIS — M5386 Other specified dorsopathies, lumbar region: Secondary | ICD-10-CM | POA: Diagnosis not present

## 2017-04-30 DIAGNOSIS — M238X2 Other internal derangements of left knee: Secondary | ICD-10-CM | POA: Insufficient documentation

## 2017-04-30 DIAGNOSIS — R29898 Other symptoms and signs involving the musculoskeletal system: Secondary | ICD-10-CM | POA: Diagnosis not present

## 2017-04-30 DIAGNOSIS — R269 Unspecified abnormalities of gait and mobility: Secondary | ICD-10-CM

## 2017-04-30 DIAGNOSIS — R293 Abnormal posture: Secondary | ICD-10-CM

## 2017-04-30 DIAGNOSIS — M62830 Muscle spasm of back: Secondary | ICD-10-CM | POA: Diagnosis not present

## 2017-04-30 DIAGNOSIS — M25551 Pain in right hip: Secondary | ICD-10-CM | POA: Insufficient documentation

## 2017-04-30 DIAGNOSIS — M25561 Pain in right knee: Secondary | ICD-10-CM | POA: Insufficient documentation

## 2017-04-30 DIAGNOSIS — G8929 Other chronic pain: Secondary | ICD-10-CM | POA: Diagnosis present

## 2017-04-30 DIAGNOSIS — M545 Low back pain: Secondary | ICD-10-CM | POA: Insufficient documentation

## 2017-04-30 DIAGNOSIS — M6281 Muscle weakness (generalized): Secondary | ICD-10-CM

## 2017-04-30 DIAGNOSIS — R2681 Unsteadiness on feet: Secondary | ICD-10-CM | POA: Insufficient documentation

## 2017-04-30 DIAGNOSIS — M25562 Pain in left knee: Secondary | ICD-10-CM | POA: Insufficient documentation

## 2017-04-30 NOTE — Patient Instructions (Signed)

## 2017-04-30 NOTE — Therapy (Signed)
[place], [place], [address] Phone: [phone]   Fax:  [phone]  Physical Therapy Treatment  Patient Details  Name: Jaime Boone MRN: [id number] Date of Birth: 06/21/1940 Referring Provider: Laroy Apple MD   Encounter Date: 04/30/2017  PT End of Session - 04/30/17 0941    Visit Number  4    Number of Visits  13    Date for PT Re-Evaluation  05/27/17    PT Start Time  0933    PT Stop Time  1014    PT Time Calculation (min)  41 min    Activity Tolerance  Patient tolerated treatment well    Behavior During Therapy  [facility] for tasks assessed/performed       Past Medical History:  Diagnosis Date  . Acid reflux   . Arthritis   . Asthma   . Cirrhosis ([place])   . 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 Degenerative joint disease   . Diabetes mellitus without complication ([place])   . Hepatitis C   . Hypertension   . Sleep apnea     Past Surgical History:  Procedure Laterality Date  . ABDOMINAL HYSTERECTOMY     TAH  . APPENDECTOMY    . DILATION AND CURETTAGE OF UTERUS    . EYE SURGERY Left    CATARACT  . FOOT SURGERY Bilateral    BUNIONECTOMY  . GALLBLADDER SURGERY    . keiloid removal      ear  . NOSE SURGERY    . oral stone extraction      There were no vitals filed for this visit.  Subjective Assessment - 04/30/17 0936    Subjective  " I am feeling like the pain is about the same, I have been working trying push on muscle to calm it down"    Currently in Pain?  Yes    Pain Score  6     Pain Location  Back    Pain Orientation  Right;Left;Lower    Pain Type  Chronic pain    Pain Onset  More than a month ago    Pain Frequency  Intermittent                      [facility] Adult PT Treatment/Exercise - 04/30/17 0943      Lumbar Exercises: Stretches   Active Hamstring Stretch  3 reps;30 seconds PNF contract/ relax with 10 sec contraction    Lower Trunk Rotation  -- 2   x 10 holding end range x 5 sec      Lumbar Exercises: Aerobic   Nustep  L5 x 6 min UE/LE      Lumbar Exercises: Supine   Bent Knee Raise  20 reps WIth ADIM    Straight Leg Raise  -- 2 x 12 verbal cues for controlled lowering      Manual Therapy   Manual therapy comments  MTPR along the R QL and lumbar paraspinals x 2 ea.    Muscle Energy Technique  resisted R hip flexion 5 x 10 sec hold in L sidelying combined with innominate Anterior mob grade 4             PT Education - 04/30/17 1015    Education provided  Yes    Education Details  education regarding DN and benefits of treatment.     Person(s) Educated  Patient    Methods  Explanation;Verbal  cues;Handout    Comprehension  Verbalized understanding;Verbal cues required       PT Short Term Goals - 04/28/17 1031      PT SHORT TERM GOAL #1   Title  She will be independent with inital HEP    Baseline  cues needed to do exercises within her tolerance.    Time  3    Period  Weeks    Status  On-going      PT SHORT TERM GOAL #2   Title  pt to verbalize/ demo proper posture in sitting/ standing activites and demonstrate proper lifting using appropriate mechanics to prevent and reduce low back pain.     Time  3    Period  Weeks    Status  Unable to assess      PT SHORT TERM GOAL #3   Title  reduce lumbr paraspinal spasm to reduce pain to </= 4/10 pain and promote trunk mobilty     Baseline  less spasm at end of session,  not yet consistant.    Time  3    Period  Weeks    Status  On-going        PT Long Term Goals - 04/15/17 1503      PT LONG TERM GOAL #1   Title  pt to improve trunk mobility by >/=20 degress for flexion, 10 degrees for extension and bil side bending with </= 2/10 pain to promote functional mobility for ADLs    Time  6    Period  Weeks    Status  New    Target Date  05/27/17      PT LONG TERM GOAL #2   Title  pt will increase bil strenght to >4/5 to assist walking  endurance and safety and symmatry to reduce low back pain    Baseline  -    Time  6    Period  Weeks    Status  New    Target Date  05/27/17      PT LONG TERM GOAL #3   Title  pt to be able to sit / stand and walk for >/= 30 min to promote functional endurance required for community ambulation with LRAD    Baseline  -    Time  6    Period  Weeks    Status  New    Target Date  05/27/17      PT LONG TERM GOAL #4   Title  increase FOTO score to </= 47% limited to demo improvement in function    Time  6    Period  Weeks    Status  New    Target Date  05/27/17      PT LONG TERM GOAL #5   Title  pt to be I with all HEP given as of last visit    Baseline  -    Time  6    Period  Weeks    Status  New    Target Date  05/27/17            Plan - 04/30/17 1004    Clinical Impression Statement  pt reports continued pain in the R low back. continued working on SIJ with MET techniques with R hip flexor activation which she reported decreased pain and tightness. reviewed HEP and benefits of stretching which she reported understanding. post session she reported decreased pain post session with minimal soreness rated at 4/10.     PT  Next Visit Plan  review and update HEP: posterior innominate rotation on R. hamstring stretching and R hip flexor MET, hip distraction mobs, hip / core strength, modalities PRN    PT Home Exercise Plan  hamstring stretching, supine marching, SLR, lower trunk rotation,  Muscle hip contract relax right    Consulted and Agree with Plan of Care  Patient       Patient will benefit from skilled therapeutic intervention in order to improve the following deficits and impairments:  Abnormal gait, Pain, Improper body mechanics, Postural dysfunction, Decreased range of motion, Decreased mobility, Decreased strength, Decreased endurance, Decreased activity tolerance, Decreased balance, Increased muscle spasms  Visit Diagnosis: Chronic right-sided low back pain, with  sciatica presence unspecified  Muscle weakness (generalized)  Abnormal posture  Abnormal gait     Problem List Patient Active Problem List   Diagnosis Date Noted  . Acute hepatitis 10/12/2016  . Hypertension 10/12/2016  . Sleep apnea 10/12/2016  . Type 2 diabetes mellitus ([place]) 10/12/2016  . Menopause 03/16/2016  . Keratosis 04/17/2015  . Vaginal lesion 03/14/2015  . Sebaceous cyst of labia 04/06/2012  . History of hepatitis C 04/06/2012  . Constipation 04/06/2012  . Postmenopausal 03/01/2012  . Vaginal atrophy 03/01/2012   Starr Lake PT, DPT, LAT, ATC  04/30/17  10:16 AM      [facility] [address] [place], [place], [address] Phone: [phone]   Fax:  [phone]  Name: Jaime Boone MRN: [id number] Date of Birth: 1941/03/27

## 2017-05-03 ENCOUNTER — Encounter: Payer: Self-pay | Admitting: Physical Therapy

## 2017-05-03 ENCOUNTER — Ambulatory Visit: Payer: Medicare Other | Admitting: Physical Therapy

## 2017-05-03 DIAGNOSIS — M545 Low back pain: Principal | ICD-10-CM

## 2017-05-03 DIAGNOSIS — R269 Unspecified abnormalities of gait and mobility: Secondary | ICD-10-CM

## 2017-05-03 DIAGNOSIS — R293 Abnormal posture: Secondary | ICD-10-CM

## 2017-05-03 DIAGNOSIS — M6281 Muscle weakness (generalized): Secondary | ICD-10-CM

## 2017-05-03 DIAGNOSIS — G8929 Other chronic pain: Secondary | ICD-10-CM

## 2017-05-03 NOTE — Therapy (Signed)
[place], [place], [address] Phone: [phone]   Fax:  [phone]  Physical Therapy Treatment  Patient Details  Name: Jaime Boone MRN: [id number] Date of Birth: [birth year] Referring Provider: Laroy Apple MD   Encounter Date: 05/03/2017  PT End of Session - 05/03/17 0956    Visit Number  5    Number of Visits  13    Date for PT Re-Evaluation  05/27/17    PT Start Time  0932    PT Stop Time  1014    PT Time Calculation (min)  42 min    Activity Tolerance  Patient tolerated treatment well;Patient limited by fatigue       Past Medical History:  Diagnosis Date  . Acid reflux   . Arthritis   . Asthma   . Cirrhosis ([place])   . 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 Degenerative joint disease   . Diabetes mellitus without complication ([place])   . Hepatitis C   . Hypertension   . Sleep apnea     Past Surgical History:  Procedure Laterality Date  . ABDOMINAL HYSTERECTOMY     TAH  . APPENDECTOMY    . DILATION AND CURETTAGE OF UTERUS    . EYE SURGERY Left    CATARACT  . FOOT SURGERY Bilateral    BUNIONECTOMY  . GALLBLADDER SURGERY    . keiloid removal      ear  . NOSE SURGERY    . oral stone extraction      There were no vitals filed for this visit.  Subjective Assessment - 05/03/17 0934    Subjective  "I feel like I am doing better today"   (Pended)     Currently in Pain?  Yes  (Pended)     Pain Score  4   (Pended)     Pain Location  Back  (Pended)     Pain Orientation  Right;Left;Lower  (Pended)                       [facility] Adult PT Treatment/Exercise - 05/03/17 0957      Lumbar Exercises: Stretches   Active Hamstring Stretch  3 reps;30 seconds PNF contract/ relax with 10 sec hold    Lower Trunk Rotation  5 reps;10 seconds    Other Lumbar Stretch Exercise  QL stretching R side performed in L sidelying over rolled towel in L Flank       Lumbar Exercises: Aerobic   Nustep  L5 x 6 min UE/LE      Lumbar Exercises: Supine   Other Supine Lumbar Exercises  isometric ball squeeze 1 x 10 holding 8 sec      Manual Therapy   Manual Therapy  Soft tissue mobilization    Manual therapy comments  MTPR along the R QL and lumbar paraspinals x 2 ea.    Soft tissue mobilization  IASTM over the glute med    Muscle Energy Technique  resisted R hip flexion 5 x 10 sec hold in L sidelying combined with innominate Anterior mob grade 4             PT Education - 05/03/17 1017    Education provided  Yes    Education Details  updated HEP for hip adductors    Person(s) Educated  Patient    Methods  Explanation;Verbal cues;Handout    Comprehension  Verbalized understanding;Verbal cues required  PT Short Term Goals - 04/28/17 1031      PT SHORT TERM GOAL #1   Title  She will be independent with inital HEP    Baseline  cues needed to do exercises within her tolerance.    Time  3    Period  Weeks    Status  On-going      PT SHORT TERM GOAL #2   Title  pt to verbalize/ demo proper posture in sitting/ standing activites and demonstrate proper lifting using appropriate mechanics to prevent and reduce low back pain.     Time  3    Period  Weeks    Status  Unable to assess      PT SHORT TERM GOAL #3   Title  reduce lumbr paraspinal spasm to reduce pain to </= 4/10 pain and promote trunk mobilty     Baseline  less spasm at end of session,  not yet consistant.    Time  3    Period  Weeks    Status  On-going        PT Long Term Goals - 04/15/17 1503      PT LONG TERM GOAL #1   Title  pt to improve trunk mobility by >/=20 degress for flexion, 10 degrees for extension and bil side bending with </= 2/10 pain to promote functional mobility for ADLs    Time  6    Period  Weeks    Status  New    Target Date  05/27/17      PT LONG TERM GOAL #2   Title  pt will increase bil strenght to >4/5 to assist walking endurance and  safety and symmatry to reduce low back pain    Baseline  -    Time  6    Period  Weeks    Status  New    Target Date  05/27/17      PT LONG TERM GOAL #3   Title  pt to be able to sit / stand and walk for >/= 30 min to promote functional endurance required for community ambulation with LRAD    Baseline  -    Time  6    Period  Weeks    Status  New    Target Date  05/27/17      PT LONG TERM GOAL #4   Title  increase FOTO score to </= 47% limited to demo improvement in function    Time  6    Period  Weeks    Status  New    Target Date  05/27/17      PT LONG TERM GOAL #5   Title  pt to be I with all HEP given as of last visit    Baseline  -    Time  6    Period  Weeks    Status  New    Target Date  05/27/17            Plan - 05/03/17 1018    Clinical Impression Statement  pt reports decreased pain since the last session. Continued working on hip stretching and strengthening following MET techniques to promote R innominate stability both anteriorly (with hip flexors and medial with hip adductors). post session she reporte decreased pain to 3/10 and declined modalities.     PT Treatment/Interventions  ADLs/Self Care Home Management;Cryotherapy;Electrical Stimulation;Iontophoresis 57m/ml Dexamethasone;Moist Heat;Ultrasound;Gait training;Stair training;Functional mobility training;Therapeutic activities;Therapeutic exercise;Manual techniques;Taping;Dry needling;Patient/family education;Neuromuscular re-education;Balance training  PT Next Visit Plan  Update HEP PRN, posterior innominate rotation on R, hamstring/ glute med stretching and R hip flexor MET,  mobs for hip: distraction, manual for glute med/ QL, and lumbar paraspinals, modalities pRN.     PT Home Exercise Plan  hamstring stretching, supine marching, SLR, lower trunk rotation,  Muscle hip contract relax right, isometric adductors    Consulted and Agree with Plan of Care  Patient       Patient will benefit from  skilled therapeutic intervention in order to improve the following deficits and impairments:  Abnormal gait, Pain, Improper body mechanics, Postural dysfunction, Decreased range of motion, Decreased mobility, Decreased strength, Decreased endurance, Decreased activity tolerance, Decreased balance, Increased muscle spasms  Visit Diagnosis: Chronic right-sided low back pain, with sciatica presence unspecified  Muscle weakness (generalized)  Abnormal posture  Abnormal gait     Problem List Patient Active Problem List   Diagnosis Date Noted  . Acute hepatitis 10/12/2016  . Hypertension 10/12/2016  . Sleep apnea 10/12/2016  . Type 2 diabetes mellitus ([place]) 10/12/2016  . Menopause 03/16/2016  . Keratosis 04/17/2015  . Vaginal lesion 03/14/2015  . Sebaceous cyst of labia 04/06/2012  . History of hepatitis C 04/06/2012  . Constipation 04/06/2012  . Postmenopausal 03/01/2012  . Vaginal atrophy 03/01/2012   Starr Lake PT, DPT, LAT, ATC  05/03/17  11:30 AM      [facility] [address] [place], [place], [address] Phone: [phone]   Fax:  [phone]  Name: Jaime Boone MRN: [id number] Date of Birth: June 13, 1940

## 2017-05-05 ENCOUNTER — Ambulatory Visit: Payer: Medicare Other | Admitting: Physical Therapy

## 2017-05-05 ENCOUNTER — Encounter: Payer: Self-pay | Admitting: Physical Therapy

## 2017-05-05 DIAGNOSIS — M545 Low back pain, unspecified: Secondary | ICD-10-CM

## 2017-05-05 DIAGNOSIS — R293 Abnormal posture: Secondary | ICD-10-CM

## 2017-05-05 DIAGNOSIS — G8929 Other chronic pain: Secondary | ICD-10-CM

## 2017-05-05 DIAGNOSIS — R269 Unspecified abnormalities of gait and mobility: Secondary | ICD-10-CM

## 2017-05-05 DIAGNOSIS — M6281 Muscle weakness (generalized): Secondary | ICD-10-CM

## 2017-05-05 NOTE — Therapy (Signed)
[place], [place], [address] Phone: [phone]   Fax:  [phone]  Physical Therapy Treatment  Patient Details  Name: Jaime Boone MRN: [id number] Date of Birth: 1940/06/16 Referring Provider: Laroy Apple MD   Encounter Date: 05/05/2017  PT End of Session - 05/05/17 1011    Visit Number  6    Number of Visits  13    Date for PT Re-Evaluation  05/27/17    PT Start Time  0915    PT Stop Time  1005    PT Time Calculation (min)  50 min    Activity Tolerance  Patient tolerated treatment well    Behavior During Therapy  [facility] for tasks assessed/performed       Past Medical History:  Diagnosis Date  . Acid reflux   . Arthritis   . Asthma   . Cirrhosis ([place])   . 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 Degenerative joint disease   . Diabetes mellitus without complication ([place])   . Hepatitis C   . Hypertension   . Sleep apnea     Past Surgical History:  Procedure Laterality Date  . ABDOMINAL HYSTERECTOMY     TAH  . APPENDECTOMY    . DILATION AND CURETTAGE OF UTERUS    . EYE SURGERY Left    CATARACT  . FOOT SURGERY Bilateral    BUNIONECTOMY  . GALLBLADDER SURGERY    . keiloid removal      ear  . NOSE SURGERY    . oral stone extraction      There were no vitals filed for this visit.  Subjective Assessment - 05/05/17 0923    Subjective  Sore today.  Had mid gluteal on left side it was brief.  (Rare for left)  pain has not interrupted her sleep in 1 week    Currently in Pain?  Yes    Pain Score  5     Pain Location  Back    Pain Orientation  Right;Left;Lower    Pain Descriptors / Indicators  Spasm;Tightness;Throbbing;Sore    Pain Type  Chronic pain    Pain Radiating Towards  buttock     Pain Frequency  Intermittent    Aggravating Factors   not sure why yesterday    Pain Relieving Factors  exercise    Effect of Pain on Daily Activities  Sleeps on stomach                       [facility] Adult PT Treatment/Exercise - 05/05/17 0001      Lumbar Exercises: Stretches   Active Hamstring Stretch  3 reps;30 seconds    Lower Trunk Rotation  10 seconds legs on ball ,  cued for pain free range    Hip Flexor Stretch  3 reps;30 seconds  right concurrent with quad stretch    Piriformis Stretch  3 reps;30 seconds PROM right      Lumbar Exercises: Supine   Ab Set  5 reps    Bent Knee Raise  10 reps    Bridge  10 reps    Other Supine Lumbar Exercises  met right hip flex 5 X 5 seconds    Other Supine Lumbar Exercises  isometric ball squeeze 1 x 10 holding 8 sec      Manual Therapy   Manual Therapy  Soft tissue mobilization    Manual therapy comments  MTPR piriformis right  Soft tissue mobilization  QL.  paraspinals and right glut.  tissue softened,  Passive QL stretch   3 X 30 while on side arm overhead.              PT Education - 05/05/17 1010    Education provided  Yes    Education Details  Log roll ib/out of bed    Person(s) Educated  Patient    Methods  Explanation;Tactile cues    Comprehension  Verbalized understanding;Returned demonstration       PT Short Term Goals - 05/05/17 1014      PT SHORT TERM GOAL #1   Title  She will be independent with inital HEP    Baseline  does some exercises daily,  does not do all.    Time  3    Period  Weeks    Status  On-going      PT SHORT TERM GOAL #3   Title  reduce lumbr paraspinal spasm to reduce pain to </= 4/10 pain and promote trunk mobilty     Baseline  spasm 8-9/10 yesterday    Time  3    Period  Weeks    Status  On-going      PT SHORT TERM GOAL #4   Title  She will report pain decreased 30%         PT Long Term Goals - 04/15/17 1503      PT LONG TERM GOAL #1   Title  pt to improve trunk mobility by >/=20 degress for flexion, 10 degrees for extension and bil side bending with </= 2/10 pain to promote functional mobility for ADLs    Time  6    Period  Weeks     Status  New    Target Date  05/27/17      PT LONG TERM GOAL #2   Title  pt will increase bil strenght to >4/5 to assist walking endurance and safety and symmatry to reduce low back pain    Baseline  -    Time  6    Period  Weeks    Status  New    Target Date  05/27/17      PT LONG TERM GOAL #3   Title  pt to be able to sit / stand and walk for >/= 30 min to promote functional endurance required for community ambulation with LRAD    Baseline  -    Time  6    Period  Weeks    Status  New    Target Date  05/27/17      PT LONG TERM GOAL #4   Title  increase FOTO score to </= 47% limited to demo improvement in function    Time  6    Period  Weeks    Status  New    Target Date  05/27/17      PT LONG TERM GOAL #5   Title  pt to be I with all HEP given as of last visit    Baseline  -    Time  6    Period  Weeks    Status  New    Target Date  05/27/17            Plan - 05/05/17 1011    Clinical Impression Statement  Pain flare on her left yesterday,  brief.  Patient knew what to do  and was able to exercise.  Tissue softened and ROM improved  post manual and stretching.  She has decided to try DN.  Able to get hip flexor to neutrap post session. Patient declined the need for modalities.  Pain mild post session no number given.      PT Next Visit Plan  Update HEP PRN, posterior innominate rotation on R, hamstring/ glute med stretching and R hip flexor MET,  mobs for hip: distraction, manual for glute med/ QL, and lumbar paraspinals, modalities pRN.     PT Home Exercise Plan  hamstring stretching, supine marching, SLR, lower trunk rotation,  Muscle hip contract relax right, isometric adductors    Consulted and Agree with Plan of Care  Patient       Patient will benefit from skilled therapeutic intervention in order to improve the following deficits and impairments:     Visit Diagnosis: Chronic right-sided low back pain, with sciatica presence unspecified  Muscle weakness  (generalized)  Abnormal posture  Abnormal gait  Chronic midline low back pain without sciatica     Problem List Patient Active Problem List   Diagnosis Date Noted  . Acute hepatitis 10/12/2016  . Hypertension 10/12/2016  . Sleep apnea 10/12/2016  . Type 2 diabetes mellitus ([place]) 10/12/2016  . Menopause 03/16/2016  . Keratosis 04/17/2015  . Vaginal lesion 03/14/2015  . Sebaceous cyst of labia 04/06/2012  . History of hepatitis C 04/06/2012  . Constipation 04/06/2012  . Postmenopausal 03/01/2012  . Vaginal atrophy 03/01/2012    Matyas Baisley  PTA 05/05/2017, 10:16 AM  [facility] [address] [place], [place], [address] Phone: [phone]   Fax:  [phone]  Name: Aradhana Gin MRN: [id number] Date of Birth: [birth year]

## 2017-05-10 ENCOUNTER — Ambulatory Visit: Payer: Medicare Other | Admitting: Physical Therapy

## 2017-05-10 ENCOUNTER — Encounter: Payer: Self-pay | Admitting: Physical Therapy

## 2017-05-10 DIAGNOSIS — M545 Low back pain: Principal | ICD-10-CM

## 2017-05-10 DIAGNOSIS — R293 Abnormal posture: Secondary | ICD-10-CM

## 2017-05-10 DIAGNOSIS — R269 Unspecified abnormalities of gait and mobility: Secondary | ICD-10-CM

## 2017-05-10 DIAGNOSIS — M6281 Muscle weakness (generalized): Secondary | ICD-10-CM

## 2017-05-10 DIAGNOSIS — G8929 Other chronic pain: Secondary | ICD-10-CM

## 2017-05-10 NOTE — Therapy (Signed)
[place], [place], [address] Phone: [phone]   Fax:  [phone]  Physical Therapy Treatment  Patient Details  Name: Jaime Boone MRN: [id number] Date of Birth: [birth year] Referring Provider: Laroy Apple MD   Encounter Date: 05/10/2017  PT End of Session - 05/10/17 1239    Visit Number  7    Number of Visits  13    Date for PT Re-Evaluation  05/27/17    PT Start Time  0935    PT Stop Time  1015    PT Time Calculation (min)  40 min       Past Medical History:  Diagnosis Date  . Acid reflux   . Arthritis   . Asthma   . Cirrhosis ([place])   . 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 Degenerative joint disease   . Diabetes mellitus without complication ([place])   . Hepatitis C   . Hypertension   . Sleep apnea     Past Surgical History:  Procedure Laterality Date  . ABDOMINAL HYSTERECTOMY     TAH  . APPENDECTOMY    . DILATION AND CURETTAGE OF UTERUS    . EYE SURGERY Left    CATARACT  . FOOT SURGERY Bilateral    BUNIONECTOMY  . GALLBLADDER SURGERY    . keiloid removal      ear  . NOSE SURGERY    . oral stone extraction      There were no vitals filed for this visit.  Subjective Assessment - 05/10/17 0939    Subjective   Pain flare last night.  I exercises to make it go away,  It helped a little and I was able to go back to sleep.     Pain Score  7  7/10 last night    Pain Location  Back    Pain Orientation  Right;Posterior    Pain Descriptors / Indicators  Tightness;Spasm;Sore    Pain Type  Chronic pain    Pain Radiating Towards  buttock    Pain Onset  More than a month ago    Pain Frequency  Constant    Aggravating Factors   sleeping on left side    Pain Relieving Factors    sitting or laying still    Effect of Pain on Daily Activities  limits ADl/  sleeping                      [facility] Adult PT Treatment/Exercise - 05/10/17 0001      Lumbar Exercises: Stretches   Passive Hamstring Stretch  3 reps;30 seconds    Double Knee to Chest Stretch  10 seconds 1 X feet on red ball,  AA    Lower Trunk Rotation  5 reps cued for pain free range    Piriformis Stretch  3 reps;30 seconds    Figure 4 Stretch  3 reps;30 seconds    Other Lumbar Stretch Exercise  QL stretchinbg with arm overhead while on side      Lumbar Exercises: Supine   Ab Set  5 reps    Bent Knee Raise  10 reps    Bridge  10 reps    Other Supine Lumbar Exercises  isometric ball squeex3e 10 X with abdominal bracing      Manual Therapy   Manual Therapy  Soft tissue mobilization    Manual therapy comments  MTPR piriformis right    Soft  tissue mobilization  Piriformis stretch and trigger poijt wor most helpful today             PT Education - 05/10/17 1231    Education provided  Yes    Education Details  current exercises,  cues    Person(s) Educated  Patient    Methods  Explanation;Demonstration;Verbal cues    Comprehension  Verbalized understanding;Returned demonstration       PT Short Term Goals - 05/10/17 1242      PT SHORT TERM GOAL #1   Title  She will be independent with inital HEP    Baseline  cued for technique    Time  3    Period  Weeks    Status  On-going      PT SHORT TERM GOAL #2   Title  pt to verbalize/ demo proper posture in sitting/ standing activites and demonstrate proper lifting using appropriate mechanics to prevent and reduce low back pain.     Time  3    Period  Weeks    Status  Unable to assess      PT SHORT TERM GOAL #3   Title  reduce lumbr paraspinal spasm to reduce pain to </= 4/10 pain and promote trunk mobilty     Baseline  spasm 8-9/10 yesterday    Time  3    Period  Weeks    Status  On-going      PT SHORT TERM GOAL #4   Title  She will report pain decreased 30%     Baseline  varies    Time  3    Period  Weeks    Status  On-going        PT Long Term Goals - 04/15/17 1503      PT LONG TERM GOAL  #1   Title  pt to improve trunk mobility by >/=20 degress for flexion, 10 degrees for extension and bil side bending with </= 2/10 pain to promote functional mobility for ADLs    Time  6    Period  Weeks    Status  New    Target Date  05/27/17      PT LONG TERM GOAL #2   Title  pt will increase bil strenght to >4/5 to assist walking endurance and safety and symmatry to reduce low back pain    Baseline  -    Time  6    Period  Weeks    Status  New    Target Date  05/27/17      PT LONG TERM GOAL #3   Title  pt to be able to sit / stand and walk for >/= 30 min to promote functional endurance required for community ambulation with LRAD    Baseline  -    Time  6    Period  Weeks    Status  New    Target Date  05/27/17      PT LONG TERM GOAL #4   Title  increase FOTO score to </= 47% limited to demo improvement in function    Time  6    Period  Weeks    Status  New    Target Date  05/27/17      PT LONG TERM GOAL #5   Title  pt to be I with all HEP given as of last visit    Baseline  -    Time  6    Period  Weeks  Status  New    Target Date  05/27/17            Plan - 05/10/17 1240    Clinical Impression Statement  Pain flares at times.  She exercises to decrease pain.  pain has not reached levels she had prior to the injection on November.  Trigger point released and stretches were able to decrease pain.  She was able to walk to the store this morning with her cane.    PT Next Visit Plan  Update HEP PRN, posterior innominate rotation on R, hamstring/ glute med stretching and R hip flexor MET,  mobs for hip: distraction, manual for glute med/ QL, and lumbar paraspinals, modalities pRN.     PT Home Exercise Plan  hamstring stretching, supine marching, SLR, lower trunk rotation,  Muscle hip contract relax right, isometric adductors    Consulted and Agree with Plan of Care  Patient       Patient will benefit from skilled therapeutic intervention in order to improve the  following deficits and impairments:     Visit Diagnosis: Chronic right-sided low back pain, with sciatica presence unspecified  Muscle weakness (generalized)  Abnormal posture  Abnormal gait     Problem List Patient Active Problem List   Diagnosis Date Noted  . Acute hepatitis 10/12/2016  . Hypertension 10/12/2016  . Sleep apnea 10/12/2016  . Type 2 diabetes mellitus ([place]) 10/12/2016  . Menopause 03/16/2016  . Keratosis 04/17/2015  . Vaginal lesion 03/14/2015  . Sebaceous cyst of labia 04/06/2012  . History of hepatitis C 04/06/2012  . Constipation 04/06/2012  . Postmenopausal 03/01/2012  . Vaginal atrophy 03/01/2012    HARRIS,KAREN  PTA 05/10/2017, 12:52 PM  [facility] [address] [place], [place], [address] Phone: [phone]   Fax:  [phone]  Name: Jaime Boone MRN: [id number] Date of Birth: February 04, 1941

## 2017-05-10 NOTE — Patient Instructions (Signed)
Piriformis Stretch, Sitting    Sit, one ankle on opposite knee, same-side hand on crossed knee. Push down on knee, keeping spine straight. Lean torso forward, with flat back, until tension is felt in hamstrings and gluteals of crossed-leg side. Hold 30___ seconds.  Repeat _3_ times per session. Do __as needed_ sessions per day.  Copyright  V[employer]. All rights reserved.

## 2017-05-12 ENCOUNTER — Ambulatory Visit: Payer: Medicare Other | Admitting: Physical Therapy

## 2017-05-12 ENCOUNTER — Encounter: Payer: Self-pay | Admitting: Physical Therapy

## 2017-05-12 DIAGNOSIS — M6281 Muscle weakness (generalized): Secondary | ICD-10-CM

## 2017-05-12 DIAGNOSIS — R293 Abnormal posture: Secondary | ICD-10-CM

## 2017-05-12 DIAGNOSIS — M545 Low back pain: Principal | ICD-10-CM

## 2017-05-12 DIAGNOSIS — G8929 Other chronic pain: Secondary | ICD-10-CM

## 2017-05-12 DIAGNOSIS — R269 Unspecified abnormalities of gait and mobility: Secondary | ICD-10-CM

## 2017-05-12 NOTE — Patient Instructions (Signed)
Issued from exercise drawer:  Standing at wall with  Abdominal bracing Scapular strengthening unattached Yellow band ER Horizontal abduction Diagonals  Away from wall shoulder extension

## 2017-05-12 NOTE — Therapy (Signed)
[place], [place], [address] Phone: [phone]   Fax:  [phone]  Physical Therapy Treatment  Patient Details  Name: Jaime Boone MRN: [id number] Date of Birth: 10/02/1940 Referring Provider: Laroy Apple MD   Encounter Date: 05/12/2017  PT End of Session - 05/12/17 1156    Visit Number  8    Number of Visits  13    Date for PT Re-Evaluation  05/27/17    PT Start Time  0928    PT Stop Time  1015    PT Time Calculation (min)  47 min    Activity Tolerance  Patient tolerated treatment well    Behavior During Therapy  [facility] for tasks assessed/performed       Past Medical History:  Diagnosis Date  . Acid reflux   . Arthritis   . Asthma   . Cirrhosis ([place])   . 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 Degenerative joint disease   . Diabetes mellitus without complication ([place])   . Hepatitis C   . Hypertension   . Sleep apnea     Past Surgical History:  Procedure Laterality Date  . ABDOMINAL HYSTERECTOMY     TAH  . APPENDECTOMY    . DILATION AND CURETTAGE OF UTERUS    . EYE SURGERY Left    CATARACT  . FOOT SURGERY Bilateral    BUNIONECTOMY  . GALLBLADDER SURGERY    . keiloid removal      ear  . NOSE SURGERY    . oral stone extraction      There were no vitals filed for this visit.  Subjective Assessment - 05/12/17 0946    Subjective  Bad day yesterday  it was spasm  up to 8/10.    It took awhile for the stretching to help. No pain today.    Currently in Pain?  No/denies    Pain Score  0-No pain    Pain Location  Back    Pain Type  Chronic pain    Pain Frequency  Constant    Aggravating Factors   sometimes for no reason    Pain Relieving Factors  stretching    Effect of Pain on Daily Activities  limits AD? sleeping                      [place] Adult PT Treatment/Exercise - 05/12/17 0001      Lumbar Exercises: Aerobic   Nustep  8  minutes L5 UE/LE      Lumbar Exercises: Standing   Wall Slides  -- could not do due to knee pain    Other Standing Lumbar Exercises  Abdominal bracing with shoulder resistance,  yellow band 10 X each ,  HEP after technique instruction. Also:  Standing pelvic tilt.    Other Standing Lumbar Exercises  AT wall with abdominal bracing    UE bands ER,  diagonals, Horizontal abduction with abdominal  .  HEP      Lumbar Exercises: Seated   Other Seated Lumbar Exercises  10 x 2 yellow hand hamstring curl yellow with abdominal bracing.     Other Seated Lumbar Exercises  sit to stand  10 x higher mat             PT Education - 05/12/17 1156    Education provided  Yes    Education Details  HEP    Methods  Explanation;Demonstration;Tactile cues;Verbal cues;Handout  Comprehension  Verbalized understanding;Returned demonstration       PT Short Term Goals - 05/10/17 1242      PT SHORT TERM GOAL #1   Title  She will be independent with inital HEP    Baseline  cued for technique    Time  3    Period  Weeks    Status  On-going      PT SHORT TERM GOAL #2   Title  pt to verbalize/ demo proper posture in sitting/ standing activites and demonstrate proper lifting using appropriate mechanics to prevent and reduce low back pain.     Time  3    Period  Weeks    Status  Unable to assess      PT SHORT TERM GOAL #3   Title  reduce lumbr paraspinal spasm to reduce pain to </= 4/10 pain and promote trunk mobilty     Baseline  spasm 8-9/10 yesterday    Time  3    Period  Weeks    Status  On-going      PT SHORT TERM GOAL #4   Title  She will report pain decreased 30%     Baseline  varies    Time  3    Period  Weeks    Status  On-going        PT Long Term Goals - 04/15/17 1503      PT LONG TERM GOAL #1   Title  pt to improve trunk mobility by >/=20 degress for flexion, 10 degrees for extension and bil side bending with </= 2/10 pain to promote functional mobility for ADLs    Time  6     Period  Weeks    Status  New    Target Date  05/27/17      PT LONG TERM GOAL #2   Title  pt will increase bil strenght to >4/5 to assist walking endurance and safety and symmatry to reduce low back pain    Baseline  -    Time  6    Period  Weeks    Status  New    Target Date  05/27/17      PT LONG TERM GOAL #3   Title  pt to be able to sit / stand and walk for >/= 30 min to promote functional endurance required for community ambulation with LRAD    Baseline  -    Time  6    Period  Weeks    Status  New    Target Date  05/27/17      PT LONG TERM GOAL #4   Title  increase FOTO score to </= 47% limited to demo improvement in function    Time  6    Period  Weeks    Status  New    Target Date  05/27/17      PT LONG TERM GOAL #5   Title  pt to be I with all HEP given as of last visit    Baseline  -    Time  6    Period  Weeks    Status  New    Target Date  05/27/17            Plan - 05/12/17 1158    Clinical Impression Statement  Patient was able to exercise pain free today in her back.  She was able to tolerate standing stabilization exercise.  She was unable to tolerate wall sits and  does note she bends over for tasks at home vs squatting due to knee issues.    PT Next Visit Plan  Update HEP PRN, posterior innominate rotation on R, hamstring/ glute med stretching and R hip flexor MET,  mobs for hip: distraction, manual for glute med/ QL, and lumbar paraspinals, modalities pRN.  FOTO if needed.   PT Home Exercise Plan  hamstring stretching, supine marching, SLR, lower trunk rotation,  Muscle hip contract relax right, isometric adductors.  standing at wall scapular unattached _ yellow    Consulted and Agree with Plan of Care  Patient       Patient will benefit from skilled therapeutic intervention in order to improve the following deficits and impairments:     Visit Diagnosis: Chronic right-sided low back pain, with sciatica presence unspecified  Muscle weakness  (generalized)  Abnormal posture  Chronic midline low back pain without sciatica  Abnormal gait     Problem List Patient Active Problem List   Diagnosis Date Noted  . Acute hepatitis 10/12/2016  . Hypertension 10/12/2016  . Sleep apnea 10/12/2016  . Type 2 diabetes mellitus ([place]) 10/12/2016  . Menopause 03/16/2016  . Keratosis 04/17/2015  . Vaginal lesion 03/14/2015  . Sebaceous cyst of labia 04/06/2012  . History of hepatitis C 04/06/2012  . Constipation 04/06/2012  . Postmenopausal 03/01/2012  . Vaginal atrophy 03/01/2012    Cassidie Veiga PTA 05/12/2017, 12:14 PM  [facility] [address] [place], [place], [address] Phone: [phone]   Fax:  [phone]  Name: Jaime Boone MRN: [id number] Date of Birth: [birth year]

## 2017-05-17 ENCOUNTER — Ambulatory Visit: Payer: Medicare Other | Admitting: Physical Therapy

## 2017-05-17 DIAGNOSIS — M6281 Muscle weakness (generalized): Secondary | ICD-10-CM

## 2017-05-17 DIAGNOSIS — R269 Unspecified abnormalities of gait and mobility: Secondary | ICD-10-CM

## 2017-05-17 DIAGNOSIS — M545 Low back pain: Secondary | ICD-10-CM | POA: Diagnosis not present

## 2017-05-17 DIAGNOSIS — R293 Abnormal posture: Secondary | ICD-10-CM

## 2017-05-17 DIAGNOSIS — G8929 Other chronic pain: Secondary | ICD-10-CM

## 2017-05-17 NOTE — Patient Instructions (Addendum)

## 2017-05-17 NOTE — Therapy (Signed)
[place], [place], [address] Phone: [phone]   Fax:  [phone]  Physical Therapy Treatment   Patient Details  Name: Jaime Boone MRN: [id number] Date of Birth: 15-Sep-1940 Referring Provider: Laroy Apple MD   Encounter Date: 05/17/2017  PT End of Session - 05/17/17 0943    Visit Number  9    Number of Visits  13    Date for PT Re-Evaluation  05/27/17    PT Start Time  0932    PT Stop Time  1014    PT Time Calculation (min)  42 min    Activity Tolerance  Patient tolerated treatment well    Behavior During Therapy  [facility] for tasks assessed/performed       Past Medical History:  Diagnosis Date  . Acid reflux   . Arthritis   . Asthma   . Cirrhosis ([place])   . 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 Degenerative joint disease   . Diabetes mellitus without complication ([place])   . Hepatitis C   . Hypertension   . Sleep apnea     Past Surgical History:  Procedure Laterality Date  . ABDOMINAL HYSTERECTOMY     TAH  . APPENDECTOMY    . DILATION AND CURETTAGE OF UTERUS    . EYE SURGERY Left    CATARACT  . FOOT SURGERY Bilateral    BUNIONECTOMY  . GALLBLADDER SURGERY    . keiloid removal      ear  . NOSE SURGERY    . oral stone extraction      There were no vitals filed for this visit.  Subjective Assessment - 05/17/17 0937    Subjective  "I think I slept wrong last night and my neck is stiff, Saturday I had some trouble in the R hip, the spasm wants to keep coming back. My low back has been doing okay.     Currently in Pain?  No/denies    Pain Score  -- reports it more as stiffness                      [facility] Adult PT Treatment/Exercise - 05/17/17 1017      Self-Care   Self-Care  Posture    Posture  appropriate posutre in sitting/ supine and other various positions as well as appropriate lifting mechanics with emphasis on proper postural  control      Lumbar Exercises: Aerobic   Nustep  5 minutes L5 UE/LE      Lumbar Exercises: Seated   Other Seated Lumbar Exercises  seated on dyna disc pelvic tilts 2 x 10 keeping core tight. 2 x 10 with sustained anterior pelvic tilt and marching in place  educated about posture inbetween sets       Knee/Hip Exercises: Seated   Long Arc Quad  2 sets;10 reps;Both;Strengthening    Sit to Sand  1 set;10 reps;without UE support hands on thighs, with elevated table              PT Education - 05/17/17 1019    Education provided  Yes    Education Details  handout for proper posture and lifting mechanics.     Person(s) Educated  Patient    Methods  Explanation;Verbal cues;Handout;Demonstration    Comprehension  Verbalized understanding;Verbal cues required;Returned demonstration       PT Short Term Goals - 05/10/17 1242      PT  SHORT TERM GOAL #1   Title  She will be independent with inital HEP    Baseline  cued for technique    Time  3    Period  Weeks    Status  On-going      PT SHORT TERM GOAL #2   Title  pt to verbalize/ demo proper posture in sitting/ standing activites and demonstrate proper lifting using appropriate mechanics to prevent and reduce low back pain.     Time  3    Period  Weeks    Status  Unable to assess      PT SHORT TERM GOAL #3   Title  reduce lumbr paraspinal spasm to reduce pain to </= 4/10 pain and promote trunk mobilty     Baseline  spasm 8-9/10 yesterday    Time  3    Period  Weeks    Status  On-going      PT SHORT TERM GOAL #4   Title  She will report pain decreased 30%     Baseline  varies    Time  3    Period  Weeks    Status  On-going        PT Long Term Goals - 04/15/17 1503      PT LONG TERM GOAL #1   Title  pt to improve trunk mobility by >/=20 degress for flexion, 10 degrees for extension and bil side bending with </= 2/10 pain to promote functional mobility for ADLs    Time  6    Period  Weeks    Status  New    Target  Date  05/27/17      PT LONG TERM GOAL #2   Title  pt will increase bil strenght to >4/5 to assist walking endurance and safety and symmatry to reduce low back pain    Baseline  -    Time  6    Period  Weeks    Status  New    Target Date  05/27/17      PT LONG TERM GOAL #3   Title  pt to be able to sit / stand and walk for >/= 30 min to promote functional endurance required for community ambulation with LRAD    Baseline  -    Time  6    Period  Weeks    Status  New    Target Date  05/27/17      PT LONG TERM GOAL #4   Title  increase FOTO score to </= 47% limited to demo improvement in function    Time  6    Period  Weeks    Status  New    Target Date  05/27/17      PT LONG TERM GOAL #5   Title  pt to be I with all HEP given as of last visit    Baseline  -    Time  6    Period  Weeks    Status  New    Target Date  05/27/17            Plan - 05/17/17 1021    Clinical Impression Statement  pt reports feeling mostly just stiffness today which she reports causes her to stay in a flexed position. Reviewed posture and lifting mechanics, and progressed core strengthening as well and hip/ knee strengthening to allow for strength to maintain good posture during ADLs. post session she reported feeling better.    PT  Next Visit Plan  Update HEP PRN, posterior innominate rotation on R, hamstring/ glute med stretching and R hip flexor MET,  mobs for hip: distraction, manual for glute med/ QL, and lumbar paraspinals, modalities pRN.     PT Home Exercise Plan  hamstring stretching, supine marching, SLR, lower trunk rotation,  Muscle hip contract relax right, isometric adductors.  standing at wall scapular unattached _ yellow, sit to stand, seated pelvic tilt, LAQ    Consulted and Agree with Plan of Care  Patient       Patient will benefit from skilled therapeutic intervention in order to improve the following deficits and impairments:  Abnormal gait, Pain, Improper body mechanics,  Postural dysfunction, Decreased range of motion, Decreased mobility, Decreased strength, Decreased endurance, Decreased activity tolerance, Decreased balance, Increased muscle spasms  Visit Diagnosis: Chronic right-sided low back pain, with sciatica presence unspecified  Muscle weakness (generalized)  Abnormal posture  Chronic midline low back pain without sciatica  Abnormal gait     Problem List Patient Active Problem List   Diagnosis Date Noted  . Acute hepatitis 10/12/2016  . Hypertension 10/12/2016  . Sleep apnea 10/12/2016  . Type 2 diabetes mellitus ([place]) 10/12/2016  . Menopause 03/16/2016  . Keratosis 04/17/2015  . Vaginal lesion 03/14/2015  . Sebaceous cyst of labia 04/06/2012  . History of hepatitis C 04/06/2012  . Constipation 04/06/2012  . Postmenopausal 03/01/2012  . Vaginal atrophy 03/01/2012   Starr Lake PT, DPT, LAT, ATC  05/17/17  10:52 AM      [facility] [address] [place], [place], [address] Phone: [phone]   Fax:  [phone]  Name: Jaime Boone MRN: [id number] Date of Birth: June 22, 1940

## 2017-05-19 ENCOUNTER — Ambulatory Visit: Payer: Medicare Other | Admitting: Physical Therapy

## 2017-05-19 ENCOUNTER — Encounter: Payer: Self-pay | Admitting: Physical Therapy

## 2017-05-19 DIAGNOSIS — M545 Low back pain, unspecified: Secondary | ICD-10-CM

## 2017-05-19 DIAGNOSIS — R293 Abnormal posture: Secondary | ICD-10-CM

## 2017-05-19 DIAGNOSIS — G8929 Other chronic pain: Secondary | ICD-10-CM

## 2017-05-19 DIAGNOSIS — M6281 Muscle weakness (generalized): Secondary | ICD-10-CM

## 2017-05-19 NOTE — Therapy (Signed)
[place], [place], [address] Phone: [phone]   Fax:  [phone]  Physical Therapy Treatment  Patient Details  Name: Jaime Boone MRN: [id number] Date of Birth: October 22, 1940 Referring Provider: Laroy Apple MD   Encounter Date: 05/19/2017  PT End of Session - 05/19/17 1014    Visit Number  10    Number of Visits  13    Date for PT Re-Evaluation  05/27/17    PT Start Time  0931    PT Stop Time  1013    PT Time Calculation (min)  42 min    Activity Tolerance  Patient tolerated treatment well    Behavior During Therapy  [facility] for tasks assessed/performed       Past Medical History:  Diagnosis Date  . Acid reflux   . Arthritis   . Asthma   . Cirrhosis ([place])   . 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 Degenerative joint disease   . Diabetes mellitus without complication ([place])   . Hepatitis C   . Hypertension   . Sleep apnea     Past Surgical History:  Procedure Laterality Date  . ABDOMINAL HYSTERECTOMY     TAH  . APPENDECTOMY    . DILATION AND CURETTAGE OF UTERUS    . EYE SURGERY Left    CATARACT  . FOOT SURGERY Bilateral    BUNIONECTOMY  . GALLBLADDER SURGERY    . keiloid removal      ear  . NOSE SURGERY    . oral stone extraction      There were no vitals filed for this visit.  Subjective Assessment - 05/19/17 0931    Subjective  "i am feeling more sore today but it is unrelated to what I have been dealing with "     Currently in Pain?  Yes    Pain Score  5     Pain Location  Back    Pain Orientation  Right;Posterior;Lateral    Pain Descriptors / Indicators  Aching;Tightness;Sore    Pain Onset  More than a month ago    Pain Frequency  Constant    Aggravating Factors   sometimes for no reasion,     Pain Relieving Factors  stretching                       [facility] Adult PT Treatment/Exercise - 05/19/17 0946      Lumbar Exercises:  Aerobic   Nustep  5 minutes L5 LE      Knee/Hip Exercises: Sidelying   Hip ABduction  Strengthening;Right;2 sets;15 reps    Other Sidelying Knee/Hip Exercises  reverse clam shell 2 x 10 in L sidelying      Manual Therapy   Manual Therapy  Other (comment)    Manual therapy comments  skilled palpation and monitoring pt throughout DN.     Soft tissue mobilization  IASTM over the R glue med/ piriformis    Other Manual Therapy  tack and stretch of the piriformsi on the L       Trigger Point Dry Needling - 05/19/17 1572    Consent Given?  Yes    Education Handout Provided  Yes    Muscles Treated Lower Body  Gluteus minimus;Piriformis    Gluteus Minimus Response  Twitch response elicited;Palpable increased muscle length glute medius    Piriformis Response  Twitch response elicited;Palpable increased muscle length R only  PT Short Term Goals - 05/10/17 1242      PT SHORT TERM GOAL #1   Title  She will be independent with inital HEP    Baseline  cued for technique    Time  3    Period  Weeks    Status  On-going      PT SHORT TERM GOAL #2   Title  pt to verbalize/ demo proper posture in sitting/ standing activites and demonstrate proper lifting using appropriate mechanics to prevent and reduce low back pain.     Time  3    Period  Weeks    Status  Unable to assess      PT SHORT TERM GOAL #3   Title  reduce lumbr paraspinal spasm to reduce pain to </= 4/10 pain and promote trunk mobilty     Baseline  spasm 8-9/10 yesterday    Time  3    Period  Weeks    Status  On-going      PT SHORT TERM GOAL #4   Title  She will report pain decreased 30%     Baseline  varies    Time  3    Period  Weeks    Status  On-going        PT Long Term Goals - 04/15/17 1503      PT LONG TERM GOAL #1   Title  pt to improve trunk mobility by >/=20 degress for flexion, 10 degrees for extension and bil side bending with </= 2/10 pain to promote functional mobility for ADLs    Time   6    Period  Weeks    Status  New    Target Date  05/27/17      PT LONG TERM GOAL #2   Title  pt will increase bil strenght to >4/5 to assist walking endurance and safety and symmatry to reduce low back pain    Baseline  -    Time  6    Period  Weeks    Status  New    Target Date  05/27/17      PT LONG TERM GOAL #3   Title  pt to be able to sit / stand and walk for >/= 30 min to promote functional endurance required for community ambulation with LRAD    Baseline  -    Time  6    Period  Weeks    Status  New    Target Date  05/27/17      PT LONG TERM GOAL #4   Title  increase FOTO score to </= 47% limited to demo improvement in function    Time  6    Period  Weeks    Status  New    Target Date  05/27/17      PT LONG TERM GOAL #5   Title  pt to be I with all HEP given as of last visit    Baseline  -    Time  6    Period  Weeks    Status  New    Target Date  05/27/17            Plan - 05/19/17 1014    Clinical Impression Statement  pt reports feeling more soreness today rated at 4-5/10. Reviewed DN and peformed on the R glute med and piriformis. manual techniques which she reported decreased pain/ soreness. she was able to do all exercise with no report of pain  but just stated she felt fatigued. standing and walking she reported soreness from the DN but no pain.     PT Next Visit Plan  Update HEP PRN, posterior innominate rotation on R, how was DN,  manual for glute med/ QL, and lumbar paraspinals, modalities pRN.     PT Home Exercise Plan  hamstring stretching, supine marching, SLR, lower trunk rotation,  Muscle hip contract relax right, isometric adductors.  standing at wall scapular unattached _ yellow, sit to stand, seated pelvic tilt, LAQ    Consulted and Agree with Plan of Care  Patient       Patient will benefit from skilled therapeutic intervention in order to improve the following deficits and impairments:  Abnormal gait, Pain, Improper body mechanics,  Postural dysfunction, Decreased range of motion, Decreased mobility, Decreased strength, Decreased endurance, Decreased activity tolerance, Decreased balance, Increased muscle spasms  Visit Diagnosis: Chronic right-sided low back pain, with sciatica presence unspecified  Muscle weakness (generalized)  Abnormal posture  Chronic midline low back pain without sciatica     Problem List Patient Active Problem List   Diagnosis Date Noted  . Acute hepatitis 10/12/2016  . Hypertension 10/12/2016  . Sleep apnea 10/12/2016  . Type 2 diabetes mellitus ([place]) 10/12/2016  . Menopause 03/16/2016  . Keratosis 04/17/2015  . Vaginal lesion 03/14/2015  . Sebaceous cyst of labia 04/06/2012  . History of hepatitis C 04/06/2012  . Constipation 04/06/2012  . Postmenopausal 03/01/2012  . Vaginal atrophy 03/01/2012    Starr Lake PT, DPT, LAT, ATC  05/19/17  10:19 AM      [facility] [address] [place], [place], [address] Phone: [phone]   Fax:  [phone]  Name: Jaime Boone MRN: [id number] Date of Birth: 11/22/1940

## 2017-05-24 ENCOUNTER — Ambulatory Visit: Payer: Medicare Other | Admitting: Physical Therapy

## 2017-05-26 ENCOUNTER — Encounter: Payer: Self-pay | Admitting: Physical Therapy

## 2017-05-26 ENCOUNTER — Ambulatory Visit: Payer: Medicare Other | Admitting: Physical Therapy

## 2017-05-26 DIAGNOSIS — M545 Low back pain, unspecified: Secondary | ICD-10-CM

## 2017-05-26 DIAGNOSIS — M238X2 Other internal derangements of left knee: Secondary | ICD-10-CM

## 2017-05-26 DIAGNOSIS — R262 Difficulty in walking, not elsewhere classified: Secondary | ICD-10-CM

## 2017-05-26 DIAGNOSIS — R29898 Other symptoms and signs involving the musculoskeletal system: Secondary | ICD-10-CM

## 2017-05-26 DIAGNOSIS — G8929 Other chronic pain: Secondary | ICD-10-CM

## 2017-05-26 DIAGNOSIS — R2681 Unsteadiness on feet: Secondary | ICD-10-CM

## 2017-05-26 DIAGNOSIS — M5386 Other specified dorsopathies, lumbar region: Secondary | ICD-10-CM

## 2017-05-26 DIAGNOSIS — M25562 Pain in left knee: Secondary | ICD-10-CM

## 2017-05-26 DIAGNOSIS — R269 Unspecified abnormalities of gait and mobility: Secondary | ICD-10-CM

## 2017-05-26 DIAGNOSIS — M6281 Muscle weakness (generalized): Secondary | ICD-10-CM

## 2017-05-26 DIAGNOSIS — M25561 Pain in right knee: Secondary | ICD-10-CM

## 2017-05-26 DIAGNOSIS — R293 Abnormal posture: Secondary | ICD-10-CM

## 2017-05-26 DIAGNOSIS — M62830 Muscle spasm of back: Secondary | ICD-10-CM

## 2017-05-26 DIAGNOSIS — M25551 Pain in right hip: Secondary | ICD-10-CM

## 2017-05-26 NOTE — Therapy (Signed)
[place], [place], [address] Phone: [phone]   Fax:  [phone]  Physical Therapy Treatment Re-certification  Patient Details  Name: Jaime Boone MRN: [id number] Date of Birth: 1940-04-12 Referring Provider: Laroy Apple, MD   Encounter Date: 05/26/2017  PT End of Session - 05/26/17 1036    Visit Number  11    Number of Visits  17    Date for PT Re-Evaluation  07/25/17    Authorization Type  Sent recert for 6 additional visits on 05/26/17.     PT Start Time  1015    PT Stop Time  1105    PT Time Calculation (min)  50 min    Activity Tolerance  Patient tolerated treatment well    Behavior During Therapy  WFL for tasks assessed/performed       Past Medical History:  Diagnosis Date  . Acid reflux   . Arthritis   . Asthma   . Cirrhosis ([place])   . 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 Degenerative joint disease   . Diabetes mellitus without complication ([place])   . Hepatitis C   . Hypertension   . Sleep apnea     Past Surgical History:  Procedure Laterality Date  . ABDOMINAL HYSTERECTOMY     TAH  . APPENDECTOMY    . DILATION AND CURETTAGE OF UTERUS    . EYE SURGERY Left    CATARACT  . FOOT SURGERY Bilateral    BUNIONECTOMY  . GALLBLADDER SURGERY    . keiloid removal      ear  . NOSE SURGERY    . oral stone extraction      There were no vitals filed for this visit.  Subjective Assessment - 05/26/17 1032    Subjective  Pt arriving to therapy 4-5/10 pain in R low back. Pt reported dry needling helped and pt is interested in trying it again.     Limitations  Sitting;Walking;Standing    How long can you sit comfortably?  30 min    How long can you stand comfortably?  15 min    How long can you walk comfortably?  15 min    Diagnostic tests  MRI on 03/27/2017    Patient Stated Goals  to decrease pain, improve motion, know what to do to calm pain if it  returns.     Currently in Pain?  Yes    Pain Score  5     Pain Location  Back    Pain Orientation  Right;Posterior;Lateral    Pain Descriptors / Indicators  Aching;Tightness;Discomfort;Sore    Pain Type  Chronic pain    Pain Onset  More than a month ago    Pain Frequency  Constant    Multiple Pain Sites  No         [facility] PT Assessment - 05/26/17 0001      Assessment   Medical Diagnosis  LBP and spondylosis    Referring Provider  Laroy Apple, MD    Onset Date/Surgical Date  -- august 2018      ROM / Strength   AROM / PROM / Strength  AROM;Strength      AROM   AROM Assessment Site  Lumbar    Lumbar Flexion  38    Lumbar Extension  10    Lumbar - Right Side Bend  28    Lumbar - Left Side Bend  30  Strength   Strength Assessment Site  Hip;Knee    Right/Left Hip  Right;Left    Right Hip Flexion  4/5    Right Hip Extension  3+/5    Right Hip ABduction  3+/5    Right Hip ADduction  3+/5    Left Hip Flexion  4/5    Left Hip Extension  3+/5    Left Hip ABduction  4-/5    Left Hip ADduction  4-/5    Right Knee Flexion  4/5    Right Knee Extension  4/5    Left Knee Flexion  4/5    Left Knee Extension  4/5      Ambulation/Gait   Gait Pattern  Step-through pattern;Decreased stride length;Trunk flexed                  [facility] Adult PT Treatment/Exercise - 05/26/17 0001      Lumbar Exercises: Stretches   Single Knee to Chest Stretch  3 reps;20 seconds    Lower Trunk Rotation  3 reps;20 seconds    Piriformis Stretch  3 reps;30 seconds    Figure 4 Stretch  3 reps;30 seconds      Lumbar Exercises: Aerobic   Nustep  6 minutes L5 LE      Lumbar Exercises: Supine   Ab Set  5 reps    Bridge  10 reps;5 seconds    Other Supine Lumbar Exercises  supine marching x 30 reps      Knee/Hip Exercises: Sidelying   Hip ABduction  Strengthening;Right;2 sets;15 reps    Clams  x 15    Other Sidelying Knee/Hip Exercises  reverse clam shell 2 x 10 in L sidelying       Manual Therapy   Manual Therapy  Other (comment)    Manual therapy comments  MTPR right piriformis    Soft tissue mobilization  STW to right pifromis and right QL    Other Manual Therapy   piriformis stretch  the L             PT Education - 05/26/17 1259    Education Details  discusses extending POC and continuing with DN and exercises    Person(s) Educated  Patient    Methods  Explanation    Comprehension  Verbalized understanding       PT Short Term Goals - 05/26/17 1049      PT SHORT TERM GOAL #1   Title  She will be independent with inital HEP    Baseline  cued for technique    Time  3    Period  Weeks    Status  Achieved      PT SHORT TERM GOAL #2   Title  pt to verbalize/ demo proper posture in sitting/ standing activites and demonstrate proper lifting using appropriate mechanics to prevent and reduce low back pain.     Status  On-going      PT SHORT TERM GOAL #3   Title  reduce lumbr paraspinal spasm to reduce pain to </= 4/10 pain and promote trunk mobilty     Baseline  Pt reporting still having paraspinal spasms with reduced pain to 4-5/10.     Status  On-going      PT SHORT TERM GOAL #4   Title  She will report pain decreased 30%     Baseline  varies    Time  3    Period  Weeks    Status  On-going  PT Long Term Goals - 05/26/17 1051      PT LONG TERM GOAL #1   Title  pt to improve trunk mobility by >/=20 degress for flexion, 10 degrees for extension and bil side bending with </= 2/10 pain to promote functional mobility for ADLs    Period  Weeks    Status  New      PT LONG TERM GOAL #2   Title  pt will increase bil strenght to >4/5 to assist walking endurance and safety and symmatry to reduce low back pain    Time  6    Period  Weeks    Status  New      PT LONG TERM GOAL #3   Title  pt to be able to sit / stand and walk for >/= 30 min to promote functional endurance required for community ambulation with LRAD    Time  6    Period   Weeks    Status  New      PT LONG TERM GOAL #4   Title  increase FOTO score to </= 47% limited to demo improvement in function    Time  6    Period  Weeks    Status  New      PT LONG TERM GOAL #5   Title  pt to be I with all HEP given as of last visit    Time  6    Period  Weeks    Status  New            Plan - 05/26/17 1047    Clinical Impression Statement  Pt reporting feeling relief following DN from previous visit. Pt tolerated exercises and manual therapy well today and reported less pain at end of session. I belive pt could benefit from further DN. Recert submitted for additional visits and date extension.     Rehab Potential  Good    PT Frequency  2x / week    PT Duration  6 weeks    PT Treatment/Interventions  ADLs/Self Care Home Management;Cryotherapy;Electrical Stimulation;Iontophoresis 4mg /ml Dexamethasone;Moist Heat;Gait training;Stair training;Functional mobility training;Therapeutic activities;Therapeutic exercise;Manual techniques;Taping;Dry needling;Patient/family education;Neuromuscular re-education;Balance training;Passive range of motion    PT Next Visit Plan  Update HEP PRN, posterior innominate rotation on R, how was DN,  manual for glute med/ QL, and lumbar paraspinals, modalities pRN.     PT Home Exercise Plan  hamstring stretching, supine marching, SLR, lower trunk rotation,  Muscle hip contract relax right, isometric adductors.  standing at wall scapular unattached _ yellow, sit to stand, seated pelvic tilt, LAQ    Consulted and Agree with Plan of Care  Patient       Patient will benefit from skilled therapeutic intervention in order to improve the following deficits and impairments:  Abnormal gait, Pain, Improper body mechanics, Postural dysfunction, Decreased range of motion, Decreased mobility, Decreased strength, Decreased endurance, Decreased activity tolerance, Decreased balance, Increased muscle spasms  Visit Diagnosis: Chronic right-sided low back  pain, with sciatica presence unspecified  Muscle weakness (generalized)  Abnormal posture  Chronic midline low back pain without sciatica  Abnormal gait  Chronic pain of left knee  Crepitus of joint of left knee  Muscle spasm of back  Decreased ROM of lumbar spine  Difficulty in walking, not elsewhere classified  Chronic pain of right knee  Unsteadiness on feet  Weakness of both legs  Right hip pain     Problem List Patient Active Problem List   Diagnosis Date  Noted  . Acute hepatitis 10/12/2016  . Hypertension 10/12/2016  . Sleep apnea 10/12/2016  . Type 2 diabetes mellitus ([place]) 10/12/2016  . Menopause 03/16/2016  . Keratosis 04/17/2015  . Vaginal lesion 03/14/2015  . Sebaceous cyst of labia 04/06/2012  . History of hepatitis C 04/06/2012  . Constipation 04/06/2012  . Postmenopausal 03/01/2012  . Vaginal atrophy 03/01/2012    Oretha Caprice, MPT 05/26/2017, 1:07 PM  [facility] [address] [place], [place], [address] Phone: [phone]   Fax:  [phone]  Name: Jaime Boone MRN: [id number] Date of Birth: [birth year]

## 2017-06-07 ENCOUNTER — Ambulatory Visit: Payer: Medicare Other | Attending: Internal Medicine | Admitting: Physical Therapy

## 2017-06-07 ENCOUNTER — Encounter: Payer: Self-pay | Admitting: Physical Therapy

## 2017-06-07 DIAGNOSIS — G8929 Other chronic pain: Secondary | ICD-10-CM | POA: Diagnosis present

## 2017-06-07 DIAGNOSIS — M545 Low back pain: Secondary | ICD-10-CM | POA: Insufficient documentation

## 2017-06-07 DIAGNOSIS — M6281 Muscle weakness (generalized): Secondary | ICD-10-CM | POA: Diagnosis present

## 2017-06-07 DIAGNOSIS — R293 Abnormal posture: Secondary | ICD-10-CM | POA: Diagnosis present

## 2017-06-07 NOTE — Therapy (Signed)
[place], [place], [address] Phone: [phone]   Fax:  [phone]  Physical Therapy Treatment  Patient Details  Name: Jaime Boone MRN: [id number] Date of Birth: 17-Oct-1940 Referring Provider: Laroy Apple, MD   Encounter Date: 06/07/2017  PT End of Session - 06/07/17 1415    Visit Number  12    Number of Visits  17    Date for PT Re-Evaluation  07/25/17    PT Start Time  0350    PT Stop Time  1501    PT Time Calculation (min)  46 min    Activity Tolerance  Patient tolerated treatment well    Behavior During Therapy  [facility] for tasks assessed/performed       Past Medical History:  Diagnosis Date  . Acid reflux   . Arthritis   . Asthma   . Cirrhosis ([place])   . 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 Degenerative joint disease   . Diabetes mellitus without complication ([place])   . Hepatitis C   . Hypertension   . Sleep apnea     Past Surgical History:  Procedure Laterality Date  . ABDOMINAL HYSTERECTOMY     TAH  . APPENDECTOMY    . DILATION AND CURETTAGE OF UTERUS    . EYE SURGERY Left    CATARACT  . FOOT SURGERY Bilateral    BUNIONECTOMY  . GALLBLADDER SURGERY    . keiloid removal      ear  . NOSE SURGERY    . oral stone extraction      There were no vitals filed for this visit.  Subjective Assessment - 06/07/17 1414    Subjective  "I think the DN really helped alot, I did see the MD he reports I don't need to come back unless I need to"     Currently in Pain?  Yes    Pain Score  0-No pain 4-5/10     Aggravating Factors   getting up turning    Pain Relieving Factors  stretching, exercise, core activity                      [facility] Adult PT Treatment/Exercise - 06/07/17 1428      Lumbar Exercises: Stretches   ITB Stretch  2 reps;30 seconds in L sidelying      Lumbar Exercises: Aerobic   Nustep  L5 x 8 min LE only to promote  endurance training      Lumbar Exercises: Supine   Bent Knee Raise  20 reps keeping core tight throughout    Other Supine Lumbar Exercises  pressing ball into legs using bil UE with focus on breathing out during press down.  2 x 10 holding 5 sec      Knee/Hip Exercises: Seated   Sit to Sand  --      Knee/Hip Exercises: Sidelying   Hip ABduction  Strengthening;Right;2 sets 12 reps    Other Sidelying Knee/Hip Exercises  reverse clam shell 2 x 10 in L sidelying      Manual Therapy   Manual therapy comments  skilled palpation and monitoring patient throughou TPDN     Soft tissue mobilization  STW along the piriforims and glute medius    Other Manual Therapy   piriformis stretch  the L       Trigger Point Dry Needling - 06/07/17 1422    Consent Given?  Yes  Education Handout Provided  No given previously    Gluteus Minimus Response  Twitch response elicited;Palpable increased muscle length R     Piriformis Response  Twitch response elicited;Palpable increased muscle length           PT Education - 06/07/17 1453    Education provided  Yes    Education Details  importance of HEP with specific focus on hip abductor strengthening.     Person(s) Educated  Patient    Methods  Explanation;Verbal cues    Comprehension  Verbalized understanding;Verbal cues required       PT Short Term Goals - 05/26/17 1049      PT SHORT TERM GOAL #1   Title  She will be independent with inital HEP    Baseline  cued for technique    Time  3    Period  Weeks    Status  Achieved      PT SHORT TERM GOAL #2   Title  pt to verbalize/ demo proper posture in sitting/ standing activites and demonstrate proper lifting using appropriate mechanics to prevent and reduce low back pain.     Status  On-going      PT SHORT TERM GOAL #3   Title  reduce lumbr paraspinal spasm to reduce pain to </= 4/10 pain and promote trunk mobilty     Baseline  Pt reporting still having paraspinal spasms with reduced pain  to 4-5/10.     Status  On-going      PT SHORT TERM GOAL #4   Title  She will report pain decreased 30%     Baseline  varies    Time  3    Period  Weeks    Status  On-going        PT Long Term Goals - 05/26/17 1051      PT LONG TERM GOAL #1   Title  pt to improve trunk mobility by >/=20 degress for flexion, 10 degrees for extension and bil side bending with </= 2/10 pain to promote functional mobility for ADLs    Period  Weeks    Status  New      PT LONG TERM GOAL #2   Title  pt will increase bil strenght to >4/5 to assist walking endurance and safety and symmatry to reduce low back pain    Time  6    Period  Weeks    Status  New      PT LONG TERM GOAL #3   Title  pt to be able to sit / stand and walk for >/= 30 min to promote functional endurance required for community ambulation with LRAD    Time  6    Period  Weeks    Status  New      PT LONG TERM GOAL #4   Title  increase FOTO score to </= 47% limited to demo improvement in function    Time  6    Period  Weeks    Status  New      PT LONG TERM GOAL #5   Title  pt to be I with all HEP given as of last visit    Time  6    Period  Weeks    Status  New            Plan - 06/07/17 1434    Clinical Impression Statement  pt reports she saw her MD and that she no longer needs to come  to him. Continued TPDN for glute medius and piriforims followed IASTM techniques. Continued hip strengthening which she performed well, with minimal report of soreness. utilized increased reps to promote endurnace training. end of session she reported continued soreness with sit to stand transition but that it was fleeting once she was standing    Rehab Potential  Good    PT Treatment/Interventions  ADLs/Self Care Home Management;Cryotherapy;Electrical Stimulation;Iontophoresis 4mg /ml Dexamethasone;Moist Heat;Gait training;Stair training;Functional mobility training;Therapeutic activities;Therapeutic exercise;Manual techniques;Taping;Dry  needling;Patient/family education;Neuromuscular re-education;Balance training;Passive range of motion    PT Next Visit Plan  Update HEP PRN, posterior innominate rotation on R, how was DN,  manual for glute med/ QL, and lumbar paraspinals, modalities pRN. endurnace training and hip abductor strengthening.     PT Home Exercise Plan  hamstring stretching, supine marching, SLR, lower trunk rotation,  Muscle hip contract relax right, isometric adductors.  standing at wall scapular unattached _ yellow, sit to stand, seated pelvic tilt, LAQ    Consulted and Agree with Plan of Care  Patient       Patient will benefit from skilled therapeutic intervention in order to improve the following deficits and impairments:  Abnormal gait, Pain, Improper body mechanics, Postural dysfunction, Decreased range of motion, Decreased mobility, Decreased strength, Decreased endurance, Decreased activity tolerance, Decreased balance, Increased muscle spasms  Visit Diagnosis: Chronic right-sided low back pain, with sciatica presence unspecified  Muscle weakness (generalized)  Abnormal posture  Chronic midline low back pain without sciatica     Problem List Patient Active Problem List   Diagnosis Date Noted  . Acute hepatitis 10/12/2016  . Hypertension 10/12/2016  . Sleep apnea 10/12/2016  . Type 2 diabetes mellitus ([place]) 10/12/2016  . Menopause 03/16/2016  . Keratosis 04/17/2015  . Vaginal lesion 03/14/2015  . Sebaceous cyst of labia 04/06/2012  . History of hepatitis C 04/06/2012  . Constipation 04/06/2012  . Postmenopausal 03/01/2012  . Vaginal atrophy 03/01/2012   Starr Lake PT, DPT, LAT, ATC  06/07/17  2:56 PM      [facility] [facility] [address] [place], [place], [address] Phone: [phone]   Fax:  [phone]  Name: Jaime Boone MRN: [id number] Date of Birth: 05-18-1940

## 2017-06-22 ENCOUNTER — Ambulatory Visit: Payer: Medicare Other | Admitting: Physical Therapy

## 2017-06-22 ENCOUNTER — Encounter: Payer: Self-pay | Admitting: Physical Therapy

## 2017-06-22 DIAGNOSIS — M545 Low back pain, unspecified: Secondary | ICD-10-CM

## 2017-06-22 DIAGNOSIS — G8929 Other chronic pain: Secondary | ICD-10-CM

## 2017-06-22 DIAGNOSIS — M6281 Muscle weakness (generalized): Secondary | ICD-10-CM

## 2017-06-22 DIAGNOSIS — R293 Abnormal posture: Secondary | ICD-10-CM

## 2017-06-22 NOTE — Therapy (Signed)
[place], [place], [address] Phone: [phone]   Fax:  [phone]  Physical Therapy Treatment  Patient Details  Name: Jaime Boone MRN: [id number] Date of Birth: Feb 18, 1941 Referring Provider: Laroy Apple, MD   Encounter Date: 06/22/2017  PT End of Session - 06/22/17 0957    Visit Number  13    Number of Visits  17    Date for PT Re-Evaluation  07/25/17    PT Start Time  0933    PT Stop Time  1013    PT Time Calculation (min)  40 min    Activity Tolerance  Patient tolerated treatment well    Behavior During Therapy  [facility] for tasks assessed/performed       Past Medical History:  Diagnosis Date  . Acid reflux   . Arthritis   . Asthma   . Cirrhosis ([place])   . 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 Degenerative joint disease   . Diabetes mellitus without complication ([place])   . Hepatitis C   . Hypertension   . Sleep apnea     Past Surgical History:  Procedure Laterality Date  . ABDOMINAL HYSTERECTOMY     TAH  . APPENDECTOMY    . DILATION AND CURETTAGE OF UTERUS    . EYE SURGERY Left    CATARACT  . FOOT SURGERY Bilateral    BUNIONECTOMY  . GALLBLADDER SURGERY    . keiloid removal      ear  . NOSE SURGERY    . oral stone extraction      There were no vitals filed for this visit.  Subjective Assessment - 06/22/17 0937    Subjective  "I am having soreness and it seems to be off and on"    Patient Stated Goals  to decrease pain, improve motion, know what to do to calm pain if it returns.     Currently in Pain?  Yes    Pain Score  4     Pain Location  Back    Pain Orientation  Right    Pain Type  Chronic pain    Pain Onset  More than a month ago    Pain Frequency  Intermittent    Aggravating Factors   getting up and turning, specific positions.     Pain Relieving Factors  stretching, exercise, core activities          [facility] PT Assessment -  06/22/17 0939      Special Tests   Leg length test   True;Apparent;other      True   Right  87.5 in.    Left   87.1 in.      Apparent   Right  93.1 in.    Left  92.5 in.            No data recorded       [place] Adult PT Treatment/Exercise - 06/22/17 0946      Self-Care   Self-Care  Other Self-Care Comments    Other Self-Care Comments   benefits of heel lift to promote equality in bil hips, due apparent and true       Knee/Hip Exercises: Standing   Hip Abduction  2 sets;10 reps;Knee straight;Both      Knee/Hip Exercises: Seated   Sit to Sand  2 sets;10 reps      Manual Therapy   Manual therapy comments  skilled palpation and monitoring patient throughou  TPDN     Soft tissue mobilization  STW along the piriforims and glute medius       Trigger Point Dry Needling - 06/22/17 0948    Consent Given?  Yes    Education Handout Provided  No    Gluteus Minimus Response  Twitch response elicited;Palpable increased muscle length R glute med and QL             PT Short Term Goals - 05/26/17 1049      PT SHORT TERM GOAL #1   Title  She will be independent with inital HEP    Baseline  cued for technique    Time  3    Period  Weeks    Status  Achieved      PT SHORT TERM GOAL #2   Title  pt to verbalize/ demo proper posture in sitting/ standing activites and demonstrate proper lifting using appropriate mechanics to prevent and reduce low back pain.     Status  On-going      PT SHORT TERM GOAL #3   Title  reduce lumbr paraspinal spasm to reduce pain to </= 4/10 pain and promote trunk mobilty     Baseline  Pt reporting still having paraspinal spasms with reduced pain to 4-5/10.     Status  On-going      PT SHORT TERM GOAL #4   Title  She will report pain decreased 30%     Baseline  varies    Time  3    Period  Weeks    Status  On-going        PT Long Term Goals - 05/26/17 1051      PT LONG TERM GOAL #1   Title  pt to improve trunk mobility by >/=20  degress for flexion, 10 degrees for extension and bil side bending with </= 2/10 pain to promote functional mobility for ADLs    Period  Weeks    Status  New      PT LONG TERM GOAL #2   Title  pt will increase bil strenght to >4/5 to assist walking endurance and safety and symmatry to reduce low back pain    Time  6    Period  Weeks    Status  New      PT LONG TERM GOAL #3   Title  pt to be able to sit / stand and walk for >/= 30 min to promote functional endurance required for community ambulation with LRAD    Time  6    Period  Weeks    Status  New      PT LONG TERM GOAL #4   Title  increase FOTO score to </= 47% limited to demo improvement in function    Time  6    Period  Weeks    Status  New      PT LONG TERM GOAL #5   Title  pt to be I with all HEP given as of last visit    Time  6    Period  Weeks    Status  New            Plan - 06/22/17 0957    Clinical Impression Statement  pt conitnues to having intermittent soreness. further assessment revealed LLD with the LLE being shorter. provided heel lift for the LLE. continued TPDN for the R glute med and an QL followed IASTM techniques. She was able to perform strengthening reporting reduced  pain. end of session she reported decreased pain / soreness and declined modalities.     PT Next Visit Plan  Update HEP PRN,  how was DN,  manual for glute med/ QL, and lumbar paraspinals, modalities pRN. endurnace training and hip abductor strengthening. hows heel lift in LLE shoe.     PT Home Exercise Plan  hamstring stretching, supine marching, SLR, lower trunk rotation,  Muscle hip contract relax right, isometric adductors.  standing at wall scapular unattached _ yellow, sit to stand, seated pelvic tilt, LAQ       Patient will benefit from skilled therapeutic intervention in order to improve the following deficits and impairments:  Abnormal gait, Pain, Improper body mechanics, Postural dysfunction, Decreased range of motion,  Decreased mobility, Decreased strength, Decreased endurance, Decreased activity tolerance, Decreased balance, Increased muscle spasms  Visit Diagnosis: Chronic right-sided low back pain, with sciatica presence unspecified  Muscle weakness (generalized)  Abnormal posture  Chronic midline low back pain without sciatica     Problem List Patient Active Problem List   Diagnosis Date Noted  . Acute hepatitis 10/12/2016  . Hypertension 10/12/2016  . Sleep apnea 10/12/2016  . Type 2 diabetes mellitus ([place]) 10/12/2016  . Menopause 03/16/2016  . Keratosis 04/17/2015  . Vaginal lesion 03/14/2015  . Sebaceous cyst of labia 04/06/2012  . History of hepatitis C 04/06/2012  . Constipation 04/06/2012  . Postmenopausal 03/01/2012  . Vaginal atrophy 03/01/2012   Starr Lake PT, DPT, LAT, ATC  06/22/17  10:08 AM      [facility] [address] [place], [place], [address] Phone: [phone]   Fax:  [phone]  Name: Jaime Boone MRN: [id number] Date of Birth: Dec 29, 1940

## 2017-06-28 ENCOUNTER — Ambulatory Visit: Payer: Medicare Other | Attending: Internal Medicine | Admitting: Physical Therapy

## 2017-06-28 ENCOUNTER — Encounter: Payer: Self-pay | Admitting: Physical Therapy

## 2017-06-28 DIAGNOSIS — M545 Low back pain: Secondary | ICD-10-CM | POA: Insufficient documentation

## 2017-06-28 DIAGNOSIS — R293 Abnormal posture: Secondary | ICD-10-CM | POA: Diagnosis present

## 2017-06-28 DIAGNOSIS — G8929 Other chronic pain: Secondary | ICD-10-CM | POA: Diagnosis present

## 2017-06-28 DIAGNOSIS — M6281 Muscle weakness (generalized): Secondary | ICD-10-CM

## 2017-06-28 NOTE — Therapy (Signed)
[place], [place], [address] Phone: [phone]   Fax:  [phone]  Physical Therapy Treatment  Patient Details  Name: Jaime Boone MRN: [id number] Date of Birth: 22-Sep-1940 Referring Provider: Laroy Apple, MD   Encounter Date: 06/28/2017  PT End of Session - 06/28/17 1018    Visit Number  14    Number of Visits  17    Date for PT Re-Evaluation  07/25/17    PT Start Time  0933    PT Stop Time  1016    PT Time Calculation (min)  43 min    Activity Tolerance  Patient tolerated treatment well    Behavior During Therapy  [facility] for tasks assessed/performed       Past Medical History:  Diagnosis Date  . Acid reflux   . Arthritis   . Asthma   . Cirrhosis ([place])   . 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 Degenerative joint disease   . Diabetes mellitus without complication ([place])   . Hepatitis C   . Hypertension   . Sleep apnea     Past Surgical History:  Procedure Laterality Date  . ABDOMINAL HYSTERECTOMY     TAH  . APPENDECTOMY    . DILATION AND CURETTAGE OF UTERUS    . EYE SURGERY Left    CATARACT  . FOOT SURGERY Bilateral    BUNIONECTOMY  . GALLBLADDER SURGERY    . keiloid removal      ear  . NOSE SURGERY    . oral stone extraction      There were no vitals filed for this visit.  Subjective Assessment - 06/28/17 0937    Subjective  " I had a great weekend and didn't have any pain or soreness until this morning, I may have done too much over the weekend"    Patient Stated Goals  to decrease pain, improve motion, know what to do to calm pain if it returns.     Currently in Pain?  Yes    Pain Score  5     Pain Location  Back    Pain Orientation  Right    Pain Descriptors / Indicators  Aching                No data recorded       [facility] Adult PT Treatment/Exercise - 06/28/17 0940      Self-Care   Other Self-Care Comments   how to  perform MTPR using tennis ball agains the wall,      Lumbar Exercises: Stretches   ITB Stretch  2 reps;30 seconds standing      Lumbar Exercises: Aerobic   Nustep  L6 x 8 min LE only      Knee/Hip Exercises: Standing   Hip Abduction  2 sets;10 reps;Knee straight;Both    Gait Training  focus on heel strike/ toe off 4 x 30 ft to reduce antalgic pattern cues for equal stride bil      Knee/Hip Exercises: Seated   Sit to Sand  2 sets;10 reps with theraband around the knees.      Manual Therapy   Manual therapy comments  MTRP along the glute med on the R using tennis ball             PT Education - 06/28/17 1018    Education provided  Yes    Education Details  updated HEP for stretching and sit to  stand techniques    Person(s) Educated  Patient    Methods  Explanation;Verbal cues;Handout;Demonstration    Comprehension  Verbalized understanding;Verbal cues required;Returned demonstration       PT Short Term Goals - 05/26/17 1049      PT SHORT TERM GOAL #1   Title  She will be independent with inital HEP    Baseline  cued for technique    Time  3    Period  Weeks    Status  Achieved      PT SHORT TERM GOAL #2   Title  pt to verbalize/ demo proper posture in sitting/ standing activites and demonstrate proper lifting using appropriate mechanics to prevent and reduce low back pain.     Status  On-going      PT SHORT TERM GOAL #3   Title  reduce lumbr paraspinal spasm to reduce pain to </= 4/10 pain and promote trunk mobilty     Baseline  Pt reporting still having paraspinal spasms with reduced pain to 4-5/10.     Status  On-going      PT SHORT TERM GOAL #4   Title  She will report pain decreased 30%     Baseline  varies    Time  3    Period  Weeks    Status  On-going        PT Long Term Goals - 05/26/17 1051      PT LONG TERM GOAL #1   Title  pt to improve trunk mobility by >/=20 degress for flexion, 10 degrees for extension and bil side bending with </= 2/10  pain to promote functional mobility for ADLs    Period  Weeks    Status  New      PT LONG TERM GOAL #2   Title  pt will increase bil strenght to >4/5 to assist walking endurance and safety and symmatry to reduce low back pain    Time  6    Period  Weeks    Status  New      PT LONG TERM GOAL #3   Title  pt to be able to sit / stand and walk for >/= 30 min to promote functional endurance required for community ambulation with LRAD    Time  6    Period  Weeks    Status  New      PT LONG TERM GOAL #4   Title  increase FOTO score to </= 47% limited to demo improvement in function    Time  6    Period  Weeks    Status  New      PT LONG TERM GOAL #5   Title  pt to be I with all HEP given as of last visit    Time  6    Period  Weeks    Status  New            Plan - 06/28/17 1019    Clinical Impression Statement  Mrs. Jaime Boone reported decreased pain and improvement in activity since the last session and only had pain this morning rated at 5/10. continued hip stretching in standing which was given as HEP and sit to stand using hands from thighs which she required multiple verbal cues to avoid reaching back with the RUE and twisting to the r which could be exacerbating her R QL pain/ tightness. end of session she reported pain dropped to a 2/10 and declined modalities.  PT Treatment/Interventions  ADLs/Self Care Home Management;Cryotherapy;Electrical Stimulation;Iontophoresis 4mg /ml Dexamethasone;Moist Heat;Gait training;Stair training;Functional mobility training;Therapeutic activities;Therapeutic exercise;Manual techniques;Taping;Dry needling;Patient/family education;Neuromuscular re-education;Balance training;Passive range of motion    PT Next Visit Plan  Update HEP PRN,  how was DN,  manual for glute med/ QL, and lumbar paraspinals, modalities pRN. endurnace training and hip abductor strengthening. hows heel lift in LLE shoe.     PT Home Exercise Plan  hamstring stretching,  supine marching, SLR, lower trunk rotation,  Muscle hip contract relax right, isometric adductors.  standing at wall scapular unattached _ yellow, sit to stand, seated pelvic tilt, LAQ    Consulted and Agree with Plan of Care  Patient       Patient will benefit from skilled therapeutic intervention in order to improve the following deficits and impairments:  Abnormal gait, Pain, Improper body mechanics, Postural dysfunction, Decreased range of motion, Decreased mobility, Decreased strength, Decreased endurance, Decreased activity tolerance, Decreased balance, Increased muscle spasms  Visit Diagnosis: Chronic right-sided low back pain, with sciatica presence unspecified  Muscle weakness (generalized)  Abnormal posture  Chronic midline low back pain without sciatica     Problem List Patient Active Problem List   Diagnosis Date Noted  . Acute hepatitis 10/12/2016  . Hypertension 10/12/2016  . Sleep apnea 10/12/2016  . Type 2 diabetes mellitus ([place]) 10/12/2016  . Menopause 03/16/2016  . Keratosis 04/17/2015  . Vaginal lesion 03/14/2015  . Sebaceous cyst of labia 04/06/2012  . History of hepatitis C 04/06/2012  . Constipation 04/06/2012  . Postmenopausal 03/01/2012  . Vaginal atrophy 03/01/2012   Starr Lake PT, DPT, LAT, ATC  06/28/17  10:22 AM      [facility] [address] [place], [place], [address] Phone: [phone]   Fax:  [phone]  Name: Jaime Boone MRN: [id number] Date of Birth: 1940-12-21

## 2017-07-05 ENCOUNTER — Ambulatory Visit: Payer: Medicare Other | Admitting: Physical Therapy

## 2017-07-05 ENCOUNTER — Encounter: Payer: Self-pay | Admitting: Physical Therapy

## 2017-07-05 DIAGNOSIS — R293 Abnormal posture: Secondary | ICD-10-CM

## 2017-07-05 DIAGNOSIS — G8929 Other chronic pain: Secondary | ICD-10-CM

## 2017-07-05 DIAGNOSIS — M545 Low back pain: Secondary | ICD-10-CM | POA: Diagnosis not present

## 2017-07-05 DIAGNOSIS — M6281 Muscle weakness (generalized): Secondary | ICD-10-CM

## 2017-07-05 NOTE — Therapy (Signed)
[place], [place], [address] Phone: [phone]   Fax:  [phone]  Physical Therapy Treatment / Discharge Summary   Patient Details  Name: Jaime Boone MRN: [id number] Date of Birth: [birth year] Referring Provider: Laroy Apple, MD   Encounter Date: 07/05/2017  PT End of Session - 07/05/17 0937    Visit Number  15    Number of Visits  17    Date for PT Re-Evaluation  07/25/17    PT Start Time  0933    PT Stop Time  1005    PT Time Calculation (min)  32 min    Activity Tolerance  Patient tolerated treatment well       Past Medical History:  Diagnosis Date  . Acid reflux   . Arthritis   . Asthma   . Cirrhosis ([place])   . 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 Degenerative joint disease   . Diabetes mellitus without complication ([place])   . Hepatitis C   . Hypertension   . Sleep apnea     Past Surgical History:  Procedure Laterality Date  . ABDOMINAL HYSTERECTOMY     TAH  . APPENDECTOMY    . DILATION AND CURETTAGE OF UTERUS    . EYE SURGERY Left    CATARACT  . FOOT SURGERY Bilateral    BUNIONECTOMY  . GALLBLADDER SURGERY    . keiloid removal      ear  . NOSE SURGERY    . oral stone extraction      There were no vitals filed for this visit.  Subjective Assessment - 07/05/17 0933    Subjective  "I am good, I think. Since last session I did have a couple episodes of ov 7/10 which is usually at the end of the day"     Currently in Pain?  No/denies    Pain Onset  More than a month ago    Pain Frequency  Occasional    Aggravating Factors   at the end of the day          [facility] PT Assessment - 07/05/17 0955      Observation/Other Assessments   Focus on Therapeutic Outcomes (FOTO)   50% limited      AROM   Lumbar Flexion  70 initally 10 degrees    Lumbar Extension  13 initally 10 degrees    Lumbar - Right Side Bend  28 initally 8 degrees    Lumbar  - Left Side Bend  24 initally 10 degrees      Strength   Right Hip Flexion  4/5    Right Hip Extension  4-/5    Right Hip ABduction  4/5    Right Hip ADduction  4/5    Left Hip Flexion  4/5    Left Hip Extension  4/5    Left Hip ABduction  4/5    Left Hip ADduction  4/5                   [facility] Adult PT Treatment/Exercise - 07/05/17 0939      Lumbar Exercises: Stretches   Single Knee to Chest Stretch  3 reps;20 seconds    ITB Stretch  2 reps;30 seconds      Lumbar Exercises: Aerobic   Nustep  L7 x 8 min UE/LE increased resistance for endurance             PT Education -  07/05/17 1008    Education provided  Yes    Education Details  reviewed previously provided HEP. benefits of continued exercise to maintain and promote strength/ endurnace and increase reps/ sets appropriately. FOTO score/ findings compared to inital assessment.     Person(s) Educated  Patient    Methods  Explanation;Verbal cues    Comprehension  Verbalized understanding;Verbal cues required       PT Short Term Goals - 07/05/17 1000      PT SHORT TERM GOAL #1   Title  She will be independent with inital HEP    Status  Achieved      PT SHORT TERM GOAL #2   Title  pt to verbalize/ demo proper posture in sitting/ standing activites and demonstrate proper lifting using appropriate mechanics to prevent and reduce low back pain.     Status  Achieved      PT SHORT TERM GOAL #3   Title  reduce lumbr paraspinal spasm to reduce pain to </= 4/10 pain and promote trunk mobilty     Time  3    Period  Weeks    Status  Achieved      PT SHORT TERM GOAL #4   Title  She will report pain decreased 30%     Time  3    Period  Weeks    Status  Achieved        PT Long Term Goals - 07/05/17 1002      PT LONG TERM GOAL #1   Title  pt to improve trunk mobility by >/=20 degress for flexion, 10 degrees for extension and bil side bending with </= 2/10 pain to promote functional mobility for ADLs     Time  6    Status  Achieved      PT LONG TERM GOAL #2   Title  pt will increase bil strenght to >4/5 to assist walking endurance and safety and symmatry to reduce low back pain    Time  6    Period  Weeks    Status  Partially Met      PT LONG TERM GOAL #3   Title  pt to be able to sit / stand and walk for >/= 30 min to promote functional endurance required for community ambulation with LRAD    Period  Weeks    Status  On-going      PT LONG TERM GOAL #4   Title  increase FOTO score to </= 47% limited to demo improvement in function    Baseline  50% limited    Time  6    Period  Weeks    Status  Achieved      PT LONG TERM GOAL #5   Title  pt to be I with all HEP given as of last visit    Period  Weeks    Status  Achieved            Plan - 07/05/17 1009    Clinical Impression Statement  [name] has made great progress with physical therapy increasing trunk mobility and hip strength. She reports intermittent pain but is able to control it with stretching and exericse. She met or partially met all goals today. pt is able to maintain and progress her current level of function independently and will be discharged from PT today.     PT Treatment/Interventions  ADLs/Self Care Home Management;Cryotherapy;Electrical Stimulation;Iontophoresis '4mg'$ /ml Dexamethasone;Moist Heat;Gait training;Stair training;Functional mobility training;Therapeutic activities;Therapeutic exercise;Manual techniques;Taping;Dry needling;Patient/family  education;Neuromuscular re-education;Balance training;Passive range of motion    PT Next Visit Plan  D/C    PT Home Exercise Plan  hamstring stretching, supine marching, SLR, lower trunk rotation,  Muscle hip contract relax right, isometric adductors.  standing at wall scapular unattached _ yellow, sit to stand, seated pelvic tilt, LAQ    Consulted and Agree with Plan of Care  Patient       Patient will benefit from skilled therapeutic intervention in order  to improve the following deficits and impairments:  Abnormal gait, Pain, Improper body mechanics, Postural dysfunction, Decreased range of motion, Decreased mobility, Decreased strength, Decreased endurance, Decreased activity tolerance, Decreased balance, Increased muscle spasms  Visit Diagnosis: Chronic right-sided low back pain, with sciatica presence unspecified  Muscle weakness (generalized)  Abnormal posture     Problem List Patient Active Problem List   Diagnosis Date Noted  . Acute hepatitis 10/12/2016  . Hypertension 10/12/2016  . Sleep apnea 10/12/2016  . Type 2 diabetes mellitus ([place]) 10/12/2016  . Menopause 03/16/2016  . Keratosis 04/17/2015  . Vaginal lesion 03/14/2015  . Sebaceous cyst of labia 04/06/2012  . History of hepatitis C 04/06/2012  . Constipation 04/06/2012  . Postmenopausal 03/01/2012  . Vaginal atrophy 03/01/2012    Starr Lake PT, DPT, LAT, ATC  07/05/17  10:11 AM      [facility] [address] [place], [place], [address] Phone: [phone]   Fax:  [phone]  Name: Loralei Radcliffe MRN: [id number] Date of Birth: 02-19-1941               PHYSICAL THERAPY DISCHARGE SUMMARY  Visits from Start of Care: 15  Current functional level related to goals / functional outcomes: See goals, FOTO 50% limited   Remaining deficits: Intermittent low back tightness on the R that is controlled with stretching and exercise. See above assessment    Education / Equipment: HEP, posture, theraband, lifting mechanics,   Plan: Patient agrees to discharge.  Patient goals were partially met. Patient is being discharged due to being pleased with the current functional level.  ?????         Nelta Caudill PT, DPT, LAT, ATC  07/05/17  10:12 AM

## 2017-10-25 ENCOUNTER — Other Ambulatory Visit: Payer: Self-pay | Admitting: Internal Medicine

## 2017-10-25 DIAGNOSIS — Z1231 Encounter for screening mammogram for malignant neoplasm of breast: Secondary | ICD-10-CM

## 2017-12-08 ENCOUNTER — Ambulatory Visit
Admission: RE | Admit: 2017-12-08 | Discharge: 2017-12-08 | Disposition: A | Discharge status: Home / Self Care | Payer: Medicare Other | Source: Ambulatory Visit | Attending: Internal Medicine | Admitting: Internal Medicine

## 2017-12-08 DIAGNOSIS — Z1231 Encounter for screening mammogram for malignant neoplasm of breast: Secondary | ICD-10-CM

## 2017-12-30 IMAGING — MR MR LUMBAR SPINE W/O CM
4 of 5 series · 26 of 48 positions shown · non-contrast
Comparison: None.

CLINICAL DATA: Low back and bilateral hip pain.

EXAM:
MRI LUMBAR SPINE WITHOUT CONTRAST
TECHNIQUE: Multiplanar, multisequence MR imaging of the lumbar spine was
performed. No intravenous contrast was administered.

[Series 4: T1 · sagittal · 4.0mm · 0.59mm/px · 5 of 13 slices shown (1 of 2)]
[im 1/13]
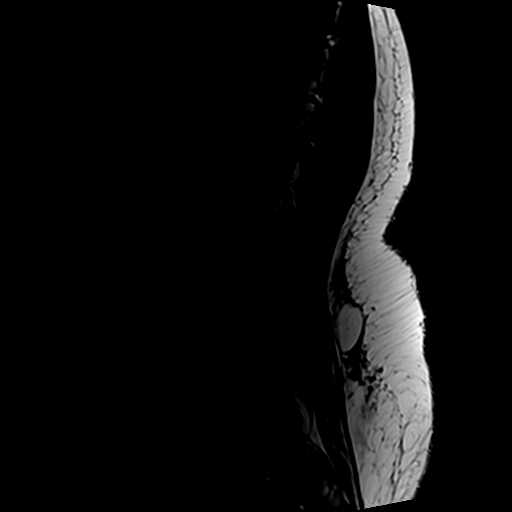
[im 4/13]
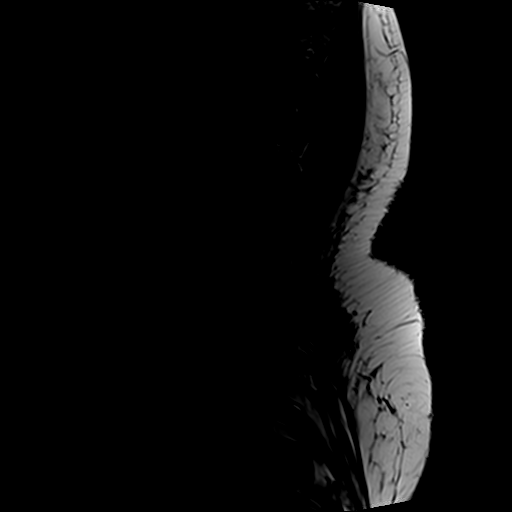
[im 7/13]
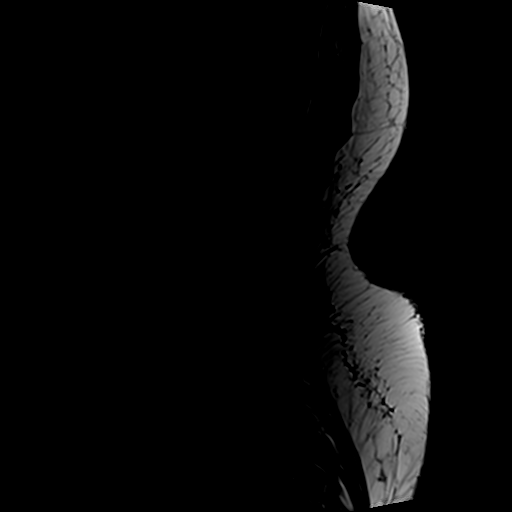
[im 10/13]
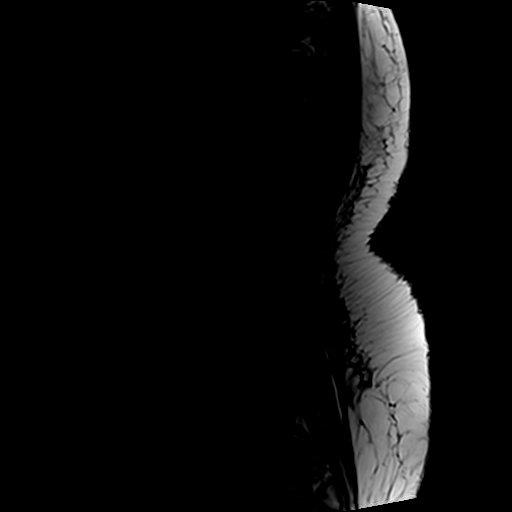
[im 13/13]
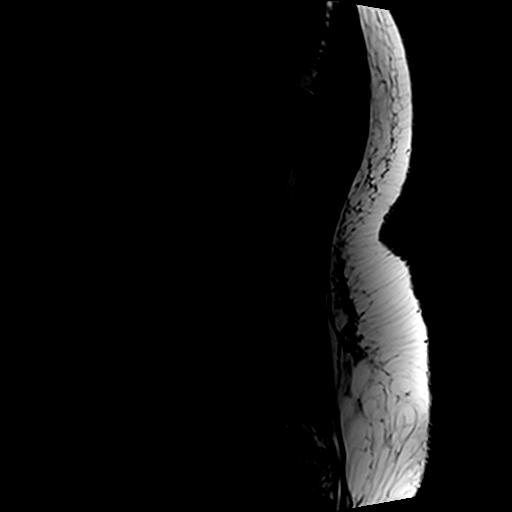

[Series 5: T2 · sagittal · 4.0mm · 0.59mm/px · 5 of 13 slices shown (1 of 2)]
[im 1/13]
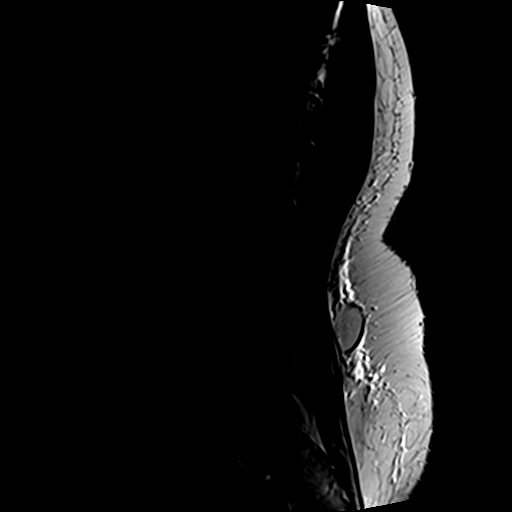
[im 4/13]
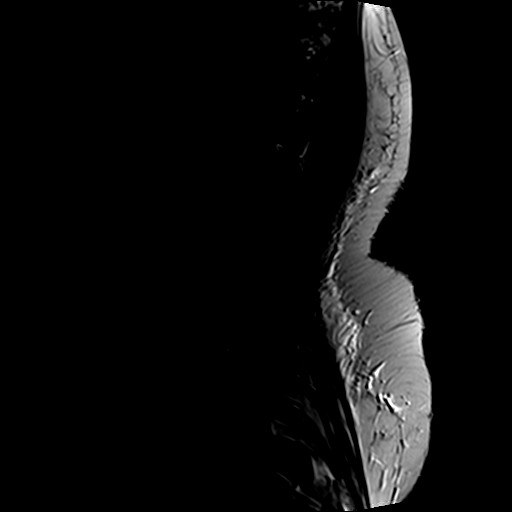
[im 7/13]
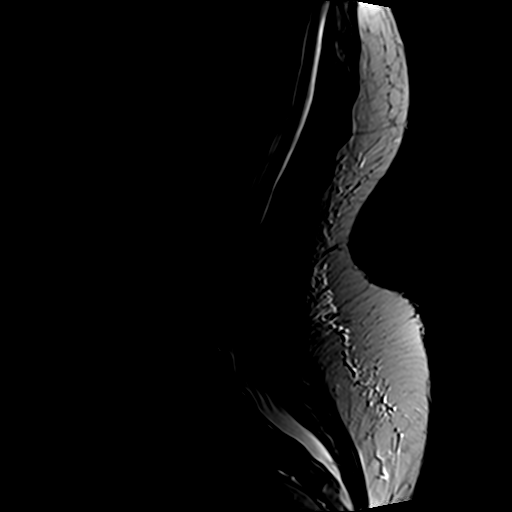
[im 10/13]
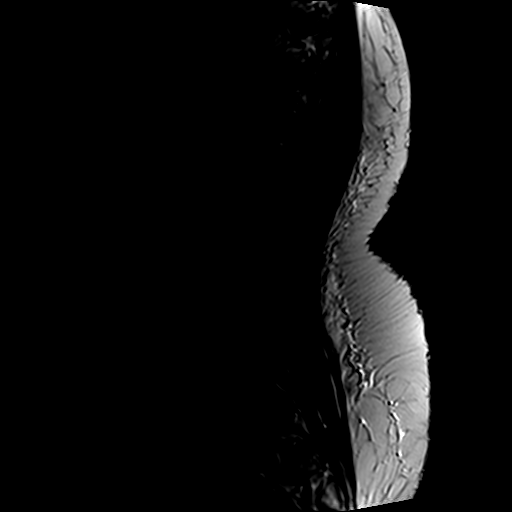
[im 13/13]
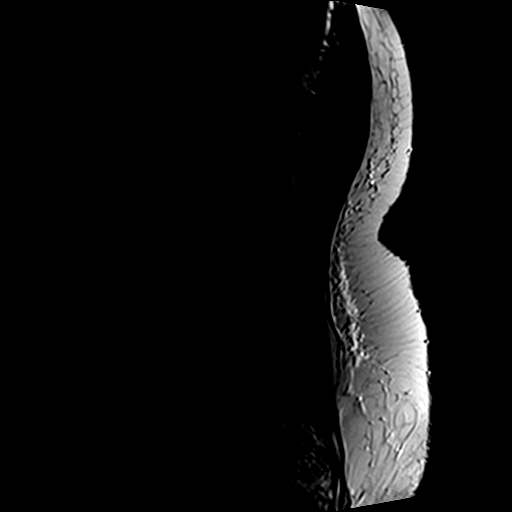

[Series 6: T2 · axial · 4.0mm · 0.70mm/px · z∈[-30,+188]mm · 11 of 47 slices shown (2 of 2)]
[im 3/47]
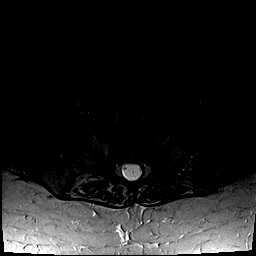
[im 6/47]
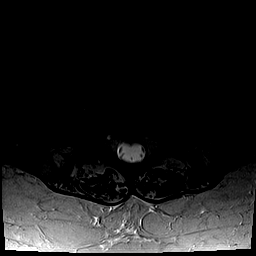
[im 9/47]
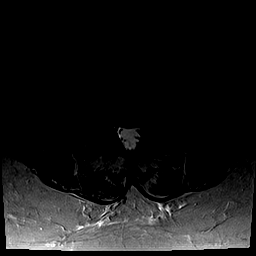
[im 15/47]
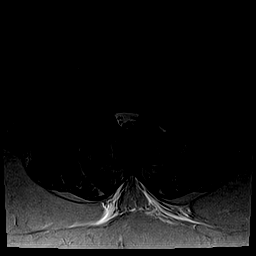
[im 21/47]
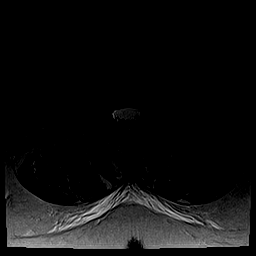
[im 24/47]
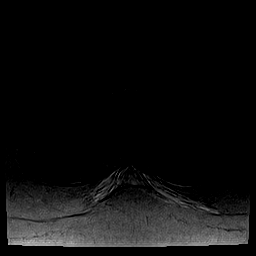
[im 26/47]
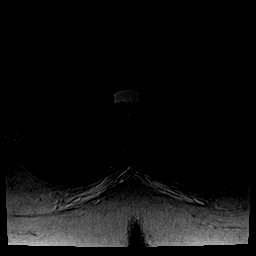
[im 32/47]
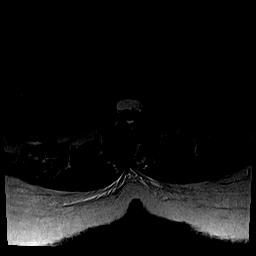
[im 38/47]
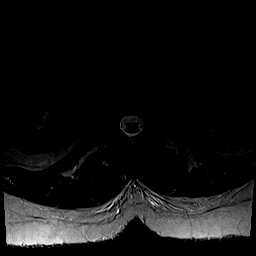
[im 41/47]
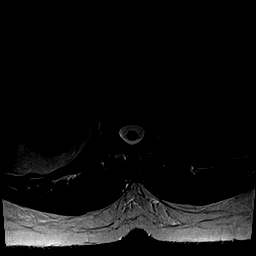
[im 44/47]
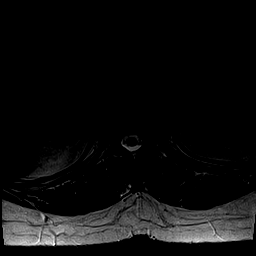

[Series 7: T1 · axial · 4.0mm · 0.35mm/px · z∈[-30,+172]mm · 5 of 47 slices shown (2 of 2)]
[im 3/47]
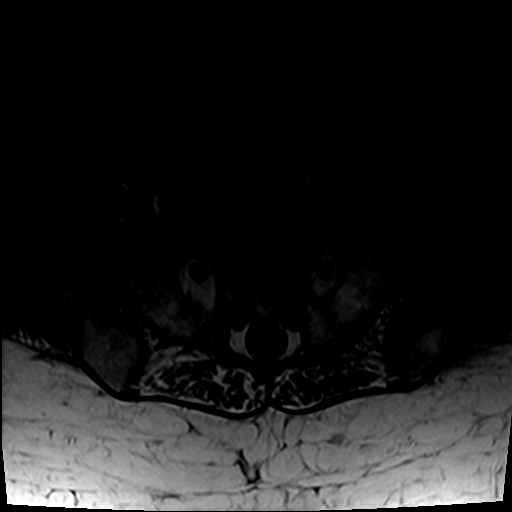
[im 6/47]
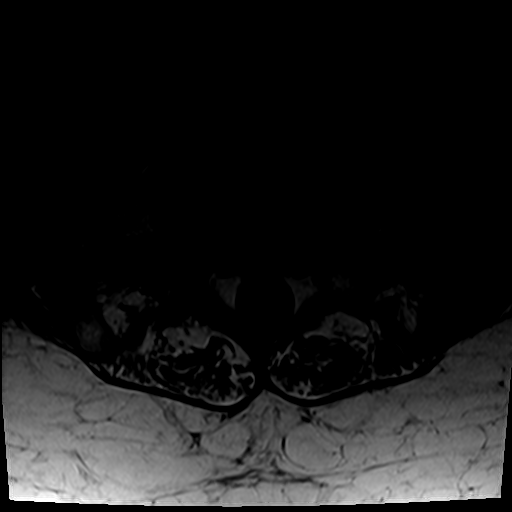
[im 9/47]
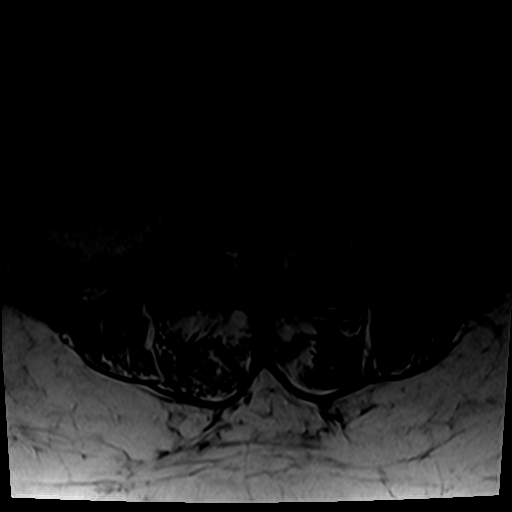
[im 24/47]
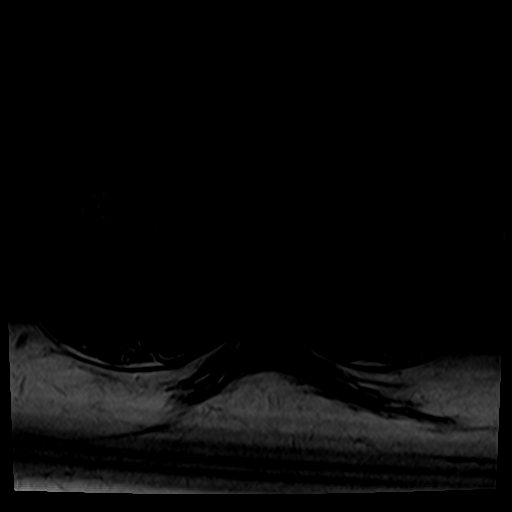
[im 41/47]
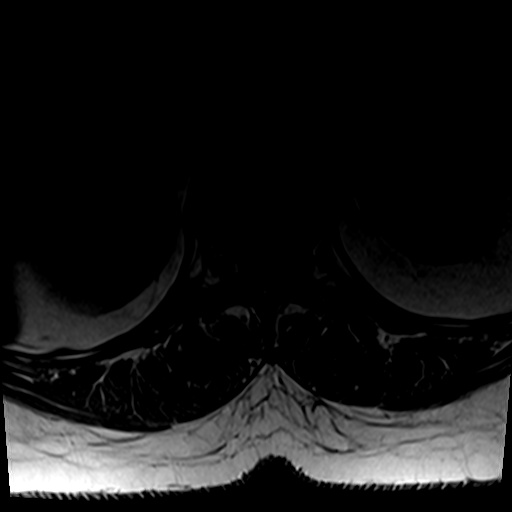

[26 of 48 positions shown; findings below may reference images not displayed]

FINDINGS: Segmentation: 5 lumbar type vertebral bodies. The last full
intervertebral disc space is labeled L5-S1.

Alignment:  Degenerative anterolisthesis of L4 and L5.

Vertebrae: Normal marrow signal. No bone lesions or fracture. Benign
scattered hemangiomas.

Conus medullaris and cauda equina: Conus extends to the L2 level.
Conus and cauda equina appear normal.

Paraspinal and other soft tissues: No significant findings.

Disc levels:

T11-12: Mild bulging annulus with slight flattening of the ventral
thecal sac.

T12-L1:  No significant findings.

L1-2:  No significant findings.  Mild facet disease.

L2-3: Mild annular bulge and mild moderate facet disease but no
significant spinal or foraminal stenosis. Mild bilateral lateral
recess encroachment.

L3-4: Moderate facet disease but no disc protrusions, spinal or
foraminal stenosis.

L4-5: Degenerative anterolisthesis of L4 with a bulging uncovered
disc. Slight flattening of the ventral thecal sac and mild lateral
recess encroachment. No significant spinal or foraminal stenosis.

L5-S1: Degenerative anterolisthesis of L5 with a bulging uncovered
disc. Advanced facet disease. No spinal stenosis. Mild bilateral
foraminal stenosis.
IMPRESSION: 1. Degenerative anterolisthesis of L4 and L5.
2. Mild bilateral lateral recess encroachment at L4-5 but no
significant spinal or foraminal stenosis.
3. No significant spinal stenosis at L5-S1. There is mild bilateral
foraminal stenosis.

## 2018-04-08 DIAGNOSIS — R600 Localized edema: Secondary | ICD-10-CM | POA: Insufficient documentation

## 2018-04-25 ENCOUNTER — Other Ambulatory Visit: Payer: Self-pay | Admitting: Nurse Practitioner

## 2018-04-25 DIAGNOSIS — K7400 Hepatic fibrosis, unspecified: Secondary | ICD-10-CM

## 2018-04-28 ENCOUNTER — Ambulatory Visit
Admission: RE | Admit: 2018-04-28 | Discharge: 2018-04-28 | Disposition: A | Discharge status: Home / Self Care | Payer: Medicare Other | Source: Ambulatory Visit | Attending: Nurse Practitioner | Admitting: Nurse Practitioner

## 2018-04-28 DIAGNOSIS — K7400 Hepatic fibrosis, unspecified: Secondary | ICD-10-CM

## 2018-06-02 ENCOUNTER — Encounter: Payer: Self-pay | Admitting: Podiatry

## 2018-06-02 ENCOUNTER — Ambulatory Visit (INDEPENDENT_AMBULATORY_CARE_PROVIDER_SITE_OTHER): Payer: Medicare Other | Admitting: Podiatry

## 2018-06-02 ENCOUNTER — Ambulatory Visit (INDEPENDENT_AMBULATORY_CARE_PROVIDER_SITE_OTHER): Payer: Medicare Other

## 2018-06-02 DIAGNOSIS — M2041 Other hammer toe(s) (acquired), right foot: Secondary | ICD-10-CM | POA: Diagnosis not present

## 2018-06-02 DIAGNOSIS — M2042 Other hammer toe(s) (acquired), left foot: Secondary | ICD-10-CM | POA: Diagnosis not present

## 2018-06-02 DIAGNOSIS — M7752 Other enthesopathy of left foot: Secondary | ICD-10-CM | POA: Diagnosis not present

## 2018-06-02 DIAGNOSIS — M7751 Other enthesopathy of right foot: Secondary | ICD-10-CM | POA: Diagnosis not present

## 2018-06-02 DIAGNOSIS — E1142 Type 2 diabetes mellitus with diabetic polyneuropathy: Secondary | ICD-10-CM | POA: Diagnosis not present

## 2018-06-02 DIAGNOSIS — M7750 Other enthesopathy of unspecified foot: Secondary | ICD-10-CM

## 2018-06-02 DIAGNOSIS — M205X9 Other deformities of toe(s) (acquired), unspecified foot: Secondary | ICD-10-CM

## 2018-06-02 MED ORDER — GABAPENTIN 300 MG PO CAPS: 300.0000 mg | ORAL_CAPSULE | Freq: Every day | ORAL | 3 refills | Status: DC

## 2018-06-02 NOTE — Progress Notes (Signed)
She presents today complaining of pain to the first metatarsophalangeal joints and across the top of the foot and surrounding the foot she states that it seems to be worse at nighttime.  Denies any trauma to the foot.  Previous bunion surgeries are resulting in pain she says.  Objective: Vital signs are stable she is alert oriented x3 pulses are palpable.  She has diminished sensorium per Semmes-Weinstein monofilament to plantar aspect of the foot bilaterally.  No open lesions or wounds.  Though she does have tenderness on attempted range of motion of the first metatarsophalangeal joint indicative of hallux limitus.  Radiographs taken today demonstrate previous bunion repair with screw fixation and some osteoarthritic changes present.  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  Plan: Discussed etiology pathology and surgical therapies at this point time started her on 300 mg of gabapentin at nighttime.  Would like to follow-up with her in 1 month to review that.  I would also like for her to see Liliane Channel to see about getting a set of diabetic shoes.

## 2018-06-20 ENCOUNTER — Other Ambulatory Visit: Payer: Medicare Other | Admitting: Orthotics

## 2018-06-30 ENCOUNTER — Ambulatory Visit: Payer: Medicare Other | Admitting: Podiatry

## 2018-08-01 ENCOUNTER — Other Ambulatory Visit: Payer: Medicare Other | Admitting: Orthotics

## 2019-01-05 ENCOUNTER — Encounter: Payer: Self-pay | Admitting: Gynecology

## 2019-04-12 IMAGING — US US ABDOMEN LIMITED
1 series · 14 of 25 positions shown · non-contrast
Comparison: CT abdomen 03/08/2018 and earlier.

CLINICAL DATA: 77-year-old female with liver fibrosis. Hepatitis C.
cholecystectomy.

EXAM:
ULTRASOUND ABDOMEN LIMITED RIGHT UPPER QUADRANT

[Series 1: us abdomen limited · 0.26mm/px · 14 of 34 slices shown]
[im 1/34]
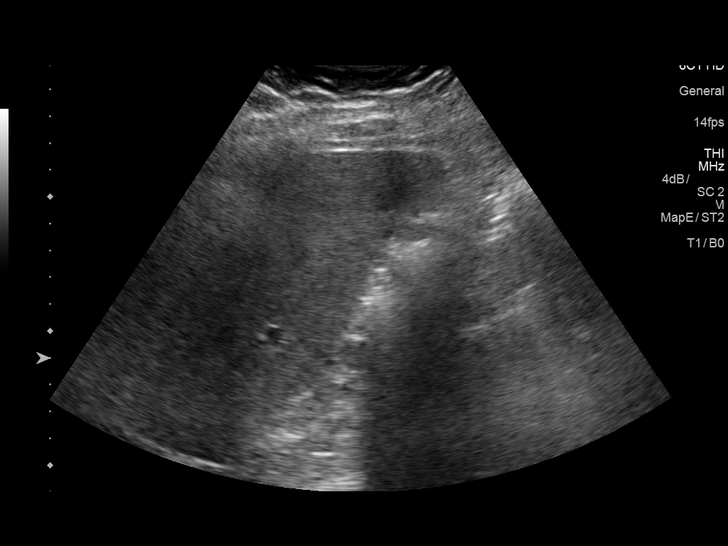
[im 3/34]
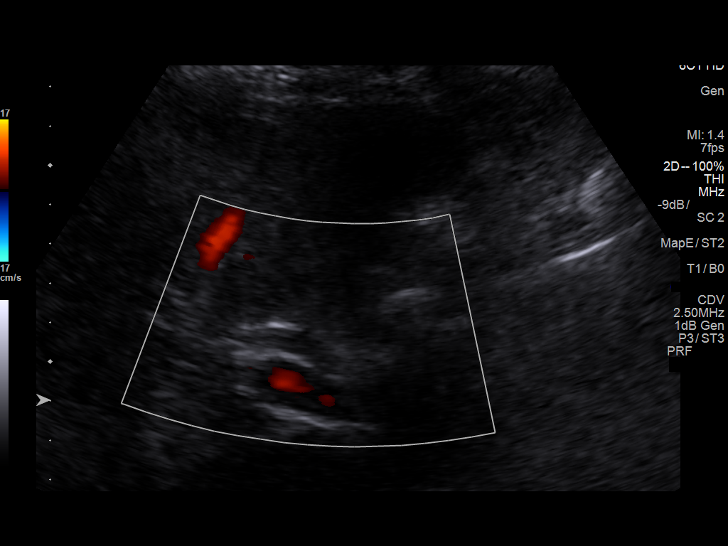
[im 6/34]
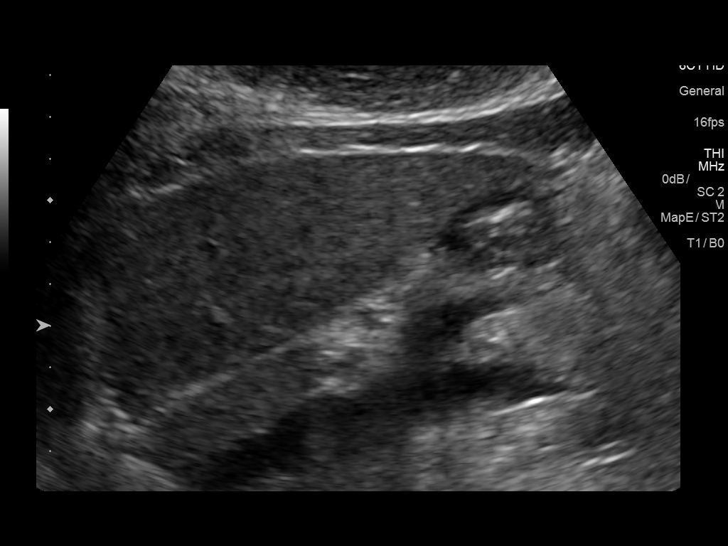
[im 9/34]
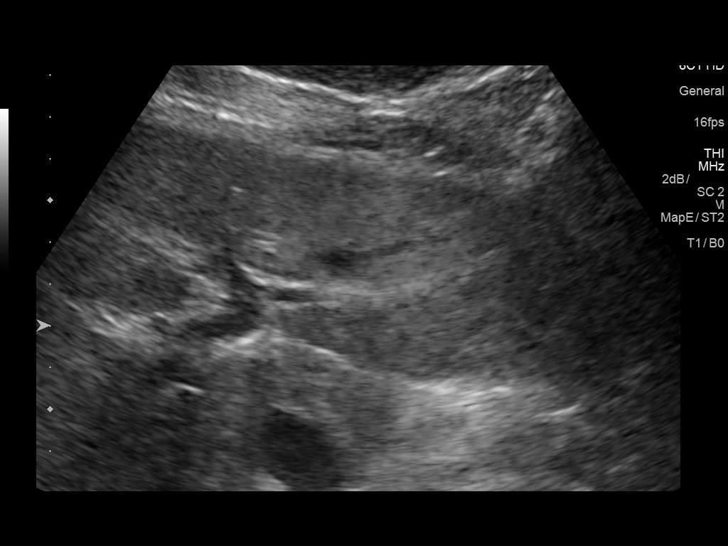
[im 12/34]
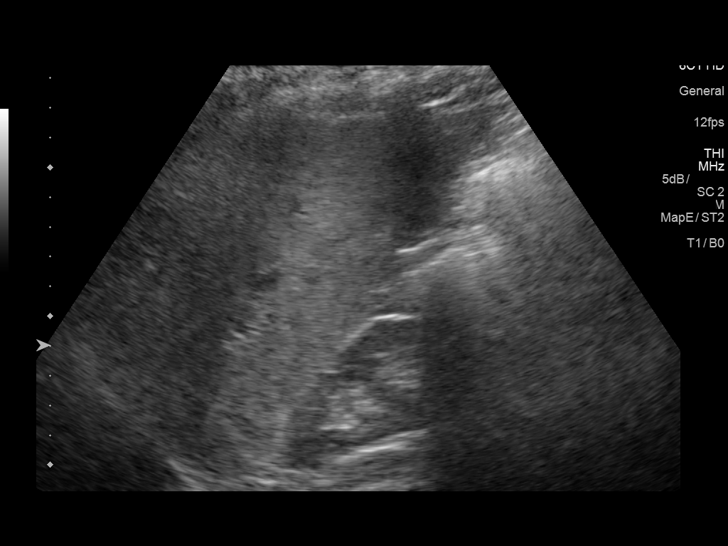
[im 13/34]
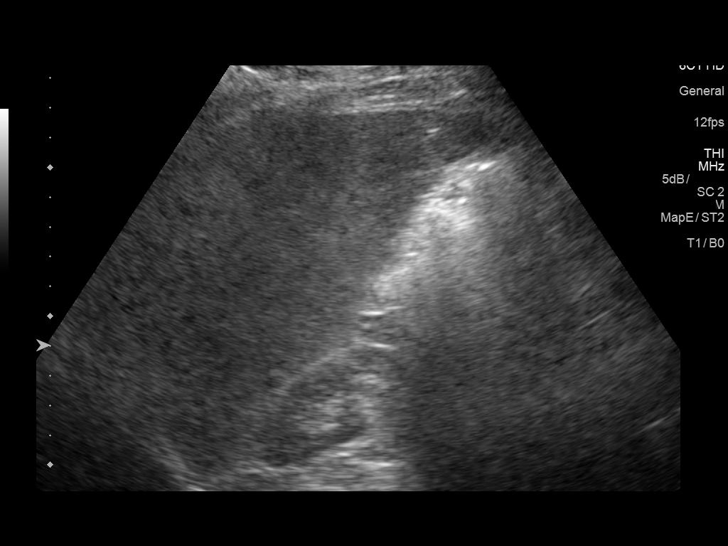
[im 16/34]
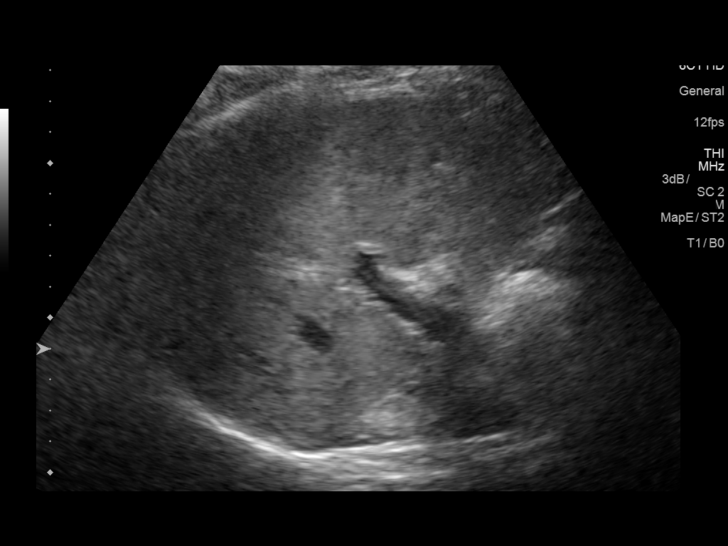
[im 18/34]
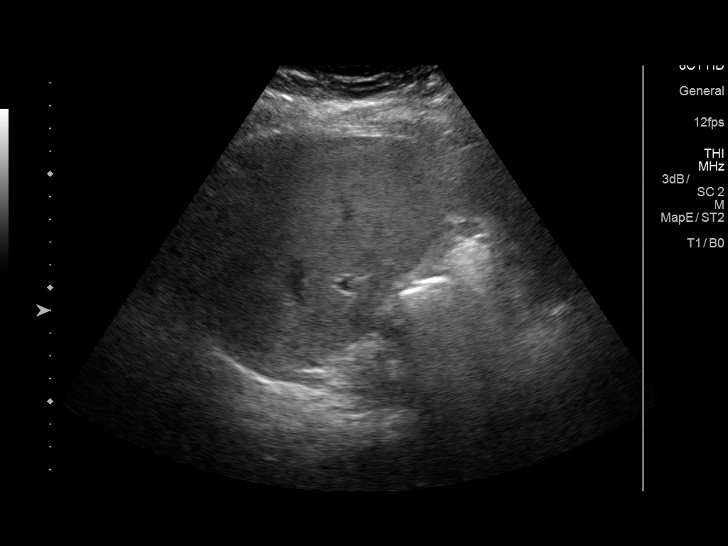
[im 21/34]
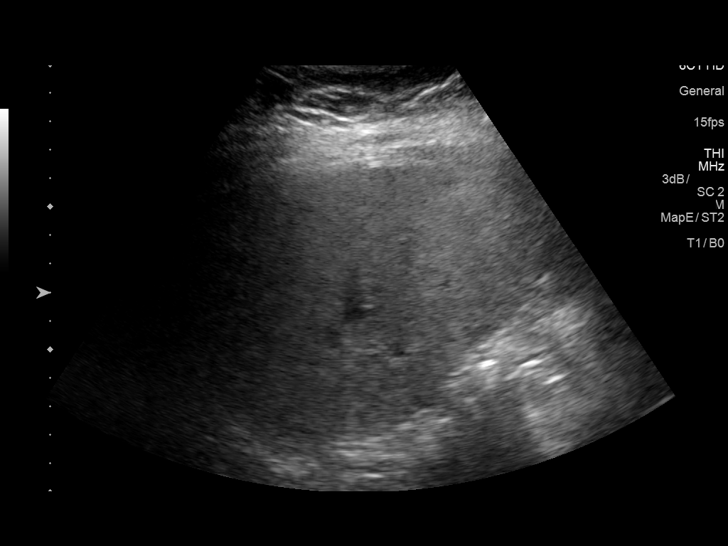
[im 23/34]
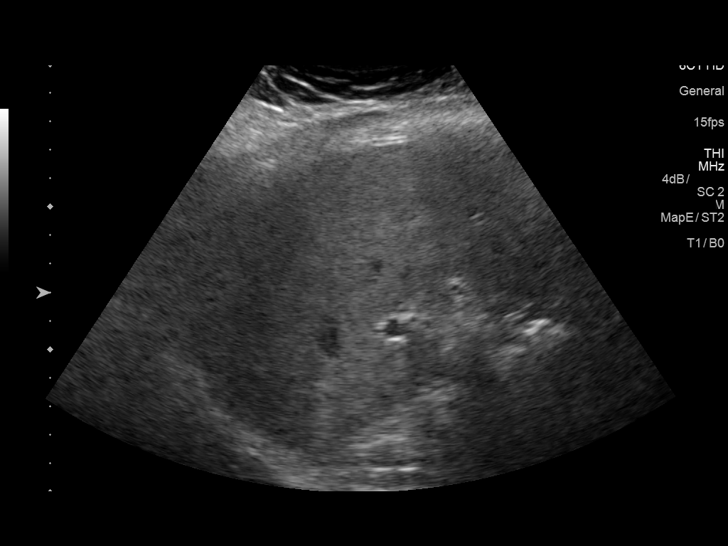
[im 25/34]
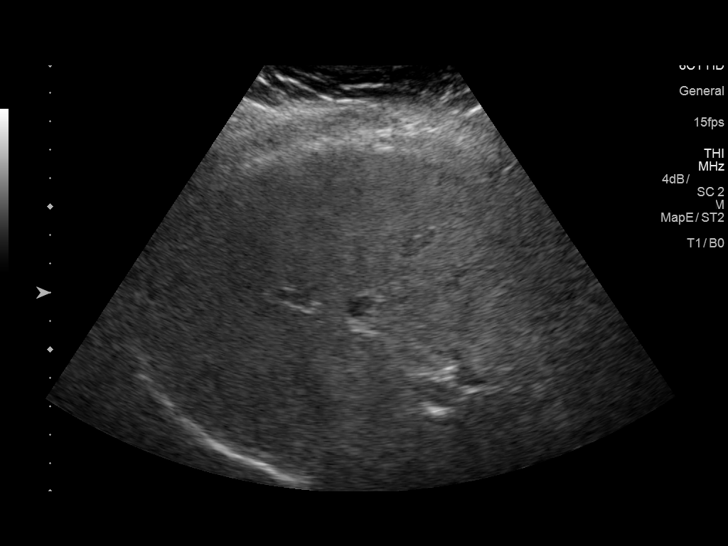
[im 28/34]
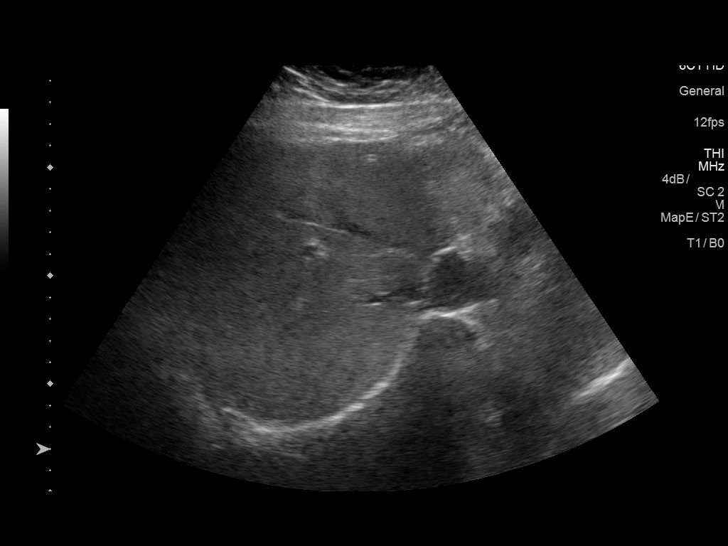
[im 31/34]
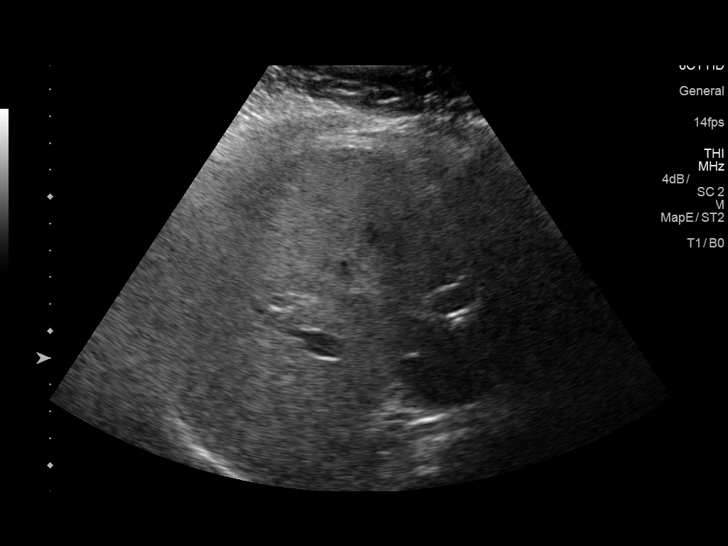
[im 34/34]
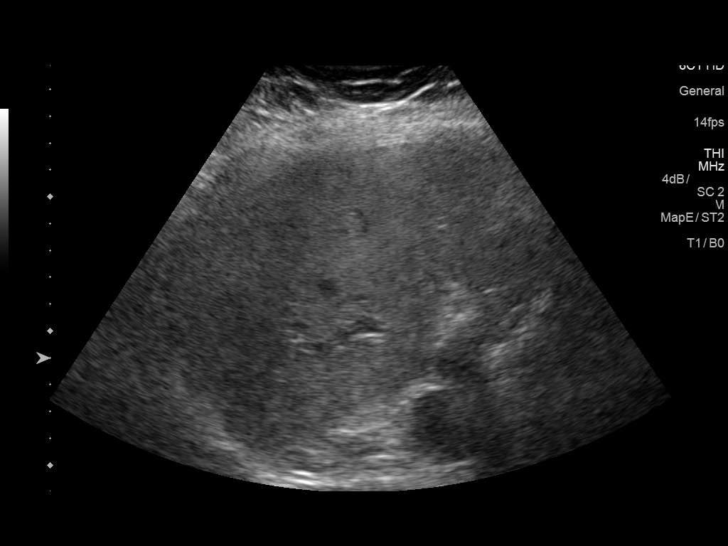

[14 of 25 positions shown; findings below may reference images not displayed]

FINDINGS: Gallbladder:

Surgically absent.

Common bile duct:

Diameter: 6 millimeters, normal.

Liver:

Mildly echogenic liver (image 13) with heterogeneous echotexture. No
discrete liver lesion. No intrahepatic biliary ductal dilatation.
Portal vein is patent on color Doppler imaging with normal direction
of blood flow towards the liver.

Other findings: Negative visible right kidney.
IMPRESSION: 1. Liver echogenicity and echotexture compatible with chronic
hepatocellular disease, but no discrete liver lesion by ultrasound.
2. Surgically absent gallbladder.  No bile duct enlargement.

## 2019-07-10 ENCOUNTER — Other Ambulatory Visit: Payer: Self-pay | Admitting: Internal Medicine

## 2019-07-10 DIAGNOSIS — Z1231 Encounter for screening mammogram for malignant neoplasm of breast: Secondary | ICD-10-CM

## 2019-07-11 ENCOUNTER — Ambulatory Visit
Admission: RE | Admit: 2019-07-11 | Discharge: 2019-07-11 | Disposition: A | Discharge status: Home / Self Care | Payer: Medicare Other | Source: Ambulatory Visit | Attending: Internal Medicine | Admitting: Internal Medicine

## 2019-07-11 ENCOUNTER — Other Ambulatory Visit: Payer: Self-pay

## 2019-07-11 DIAGNOSIS — Z1231 Encounter for screening mammogram for malignant neoplasm of breast: Secondary | ICD-10-CM

## 2019-07-14 ENCOUNTER — Ambulatory Visit (INDEPENDENT_AMBULATORY_CARE_PROVIDER_SITE_OTHER): Payer: Medicare Other | Admitting: Podiatry

## 2019-07-14 ENCOUNTER — Other Ambulatory Visit: Payer: Self-pay

## 2019-07-14 ENCOUNTER — Encounter: Payer: Self-pay | Admitting: Podiatry

## 2019-07-14 DIAGNOSIS — E119 Type 2 diabetes mellitus without complications: Secondary | ICD-10-CM

## 2019-07-14 DIAGNOSIS — M79674 Pain in right toe(s): Secondary | ICD-10-CM

## 2019-07-14 DIAGNOSIS — B351 Tinea unguium: Secondary | ICD-10-CM | POA: Insufficient documentation

## 2019-07-14 DIAGNOSIS — R002 Palpitations: Secondary | ICD-10-CM | POA: Insufficient documentation

## 2019-07-14 NOTE — Progress Notes (Signed)
This patient returns to my office for at risk foot care.  This patient requires this care by a professional since this patient will be at risk due to having type 2 diabetes. This patient is unable to cut nails himself since the patient cannot reach his nails.These nails are painful walking and wearing shoes.  This patient presents for at risk foot care today.  General Appearance  Alert, conversant and in no acute stress.  Vascular  Dorsalis pedis and posterior tibial  pulses are  weaklypalpable  bilaterally.  Capillary return is within normal limits  bilaterally. Temperature is within normal limits  bilaterally.  Neurologic  Senn-Weinstein monofilament wire test diminished  bilaterally. Muscle power within normal limits bilaterally.  Nails Thick disfigured discolored nails with subungual debris  Second nail right foot.. No evidence of bacterial infection or drainage bilaterally.  Orthopedic  No limitations of motion  feet .  No crepitus or effusions noted.  No bony pathology or digital deformities noted. Hallux limitus    Skin  normotropic skin with no porokeratosis noted bilaterally.  No signs of infections or ulcers noted.     Onychomycosis  Pain in right toes    Consent was obtained for treatment procedures.   Mechanical debridement of nails 1-5  bilaterally performed with a nail nipper.  Filed with dremel without incident.    Return office visit   prn                  Told patient to return for periodic foot care and evaluation due to potential at risk complications.   Gardiner Barefoot DPM

## 2020-06-10 ENCOUNTER — Other Ambulatory Visit: Payer: Self-pay | Admitting: Internal Medicine

## 2020-06-10 DIAGNOSIS — Z1231 Encounter for screening mammogram for malignant neoplasm of breast: Secondary | ICD-10-CM

## 2020-07-02 ENCOUNTER — Ambulatory Visit (INDEPENDENT_AMBULATORY_CARE_PROVIDER_SITE_OTHER): Payer: Medicare Other | Admitting: Podiatry

## 2020-07-02 ENCOUNTER — Encounter: Payer: Self-pay | Admitting: Podiatry

## 2020-07-02 ENCOUNTER — Other Ambulatory Visit: Payer: Self-pay

## 2020-07-02 DIAGNOSIS — E119 Type 2 diabetes mellitus without complications: Secondary | ICD-10-CM | POA: Diagnosis not present

## 2020-07-02 DIAGNOSIS — M79674 Pain in right toe(s): Secondary | ICD-10-CM

## 2020-07-02 DIAGNOSIS — B351 Tinea unguium: Secondary | ICD-10-CM

## 2020-07-02 NOTE — Progress Notes (Signed)
This patient returns to my office for at risk foot care.  This patient requires this care by a professional since this patient will be at risk due to having type 2 diabetes.  This patient is unable to cut nails herself since the patient cannot reach her nails.These nails are painful walking and wearing shoes.  This patient presents for at risk foot care today.  General Appearance  Alert, conversant and in no acute stress.  Vascular  Dorsalis pedis and posterior tibial  pulses are  weakly palpable  bilaterally.  Capillary return is within normal limits  bilaterally. Temperature is within normal limits  bilaterally.  Neurologic  Senn-Weinstein monofilament wire test diminished  bilaterally. Muscle power within normal limits bilaterally.  Nails Thick disfigured discolored nails with subungual debris  second nail right foot.. No evidence of bacterial infection or drainage bilaterally.  Orthopedic  No limitations of motion  feet .  No crepitus or effusions noted.  No bony pathology or digital deformities noted. Hallux limitus    Skin  normotropic skin with no porokeratosis noted bilaterally.  No signs of infections or ulcers noted.     Onychomycosis  Pain in right toes    Consent was obtained for treatment procedures.   Mechanical debridement of nails 1-5  bilaterally performed with a nail nipper.  Filed with dremel without incident.    Return office visit   prn                  Told patient to return for periodic foot care and evaluation due to potential at risk complications.   Gardiner Barefoot DPM

## 2020-08-02 ENCOUNTER — Other Ambulatory Visit: Payer: Self-pay

## 2020-08-02 ENCOUNTER — Ambulatory Visit
Admission: RE | Admit: 2020-08-02 | Discharge: 2020-08-02 | Disposition: A | Discharge status: Home / Self Care | Payer: Medicare Other | Source: Ambulatory Visit | Attending: Internal Medicine | Admitting: Internal Medicine

## 2020-08-02 DIAGNOSIS — Z1231 Encounter for screening mammogram for malignant neoplasm of breast: Secondary | ICD-10-CM

## 2020-09-06 ENCOUNTER — Other Ambulatory Visit: Payer: Self-pay

## 2020-09-06 ENCOUNTER — Encounter (HOSPITAL_COMMUNITY): Payer: Self-pay | Admitting: Emergency Medicine

## 2020-09-06 ENCOUNTER — Emergency Department (HOSPITAL_COMMUNITY)
Admission: EM | Admit: 2020-09-06 | Discharge: 2020-09-06 | Disposition: A | Discharge status: Home / Self Care | Payer: Medicare Other | Attending: Emergency Medicine | Admitting: Emergency Medicine

## 2020-09-06 DIAGNOSIS — Z79899 Other long term (current) drug therapy: Secondary | ICD-10-CM | POA: Diagnosis not present

## 2020-09-06 DIAGNOSIS — E039 Hypothyroidism, unspecified: Secondary | ICD-10-CM | POA: Insufficient documentation

## 2020-09-06 DIAGNOSIS — E860 Dehydration: Secondary | ICD-10-CM

## 2020-09-06 DIAGNOSIS — Z87891 Personal history of nicotine dependence: Secondary | ICD-10-CM | POA: Insufficient documentation

## 2020-09-06 DIAGNOSIS — J45909 Unspecified asthma, uncomplicated: Secondary | ICD-10-CM | POA: Insufficient documentation

## 2020-09-06 DIAGNOSIS — R110 Nausea: Secondary | ICD-10-CM | POA: Insufficient documentation

## 2020-09-06 DIAGNOSIS — Z794 Long term (current) use of insulin: Secondary | ICD-10-CM | POA: Insufficient documentation

## 2020-09-06 DIAGNOSIS — I10 Essential (primary) hypertension: Secondary | ICD-10-CM | POA: Insufficient documentation

## 2020-09-06 DIAGNOSIS — T8859XA Other complications of anesthesia, initial encounter: Secondary | ICD-10-CM | POA: Diagnosis present

## 2020-09-06 DIAGNOSIS — E119 Type 2 diabetes mellitus without complications: Secondary | ICD-10-CM | POA: Diagnosis not present

## 2020-09-06 DIAGNOSIS — Z7984 Long term (current) use of oral hypoglycemic drugs: Secondary | ICD-10-CM | POA: Diagnosis not present

## 2020-09-06 LAB — CBC WITH DIFFERENTIAL/PLATELET
Abs Immature Granulocytes: 0.02 10*3/uL (ref 0.00–0.07)
Basophils Absolute: 0 10*3/uL (ref 0.0–0.1)
Basophils Relative: 1 %
Eosinophils Absolute: 0.2 10*3/uL (ref 0.0–0.5)
Eosinophils Relative: 3 %
HCT: 35.3 % — ABNORMAL LOW (ref 36.0–46.0)
Hemoglobin: 11 g/dL — ABNORMAL LOW (ref 12.0–15.0)
Immature Granulocytes: 0 %
Lymphocytes Relative: 30 %
Lymphs Abs: 1.8 10*3/uL (ref 0.7–4.0)
MCH: 21 pg — ABNORMAL LOW (ref 26.0–34.0)
MCHC: 31.2 g/dL (ref 30.0–36.0)
MCV: 67.4 fL — ABNORMAL LOW (ref 80.0–100.0)
Monocytes Absolute: 0.5 10*3/uL (ref 0.1–1.0)
Monocytes Relative: 7 %
Neutro Abs: 3.7 10*3/uL (ref 1.7–7.7)
Neutrophils Relative %: 59 %
Platelets: 244 10*3/uL (ref 150–400)
RBC: 5.24 MIL/uL — ABNORMAL HIGH (ref 3.87–5.11)
RDW: 16 % — ABNORMAL HIGH (ref 11.5–15.5)
WBC: 6.1 10*3/uL (ref 4.0–10.5)
nRBC: 0 % (ref 0.0–0.2)

## 2020-09-06 LAB — COMPREHENSIVE METABOLIC PANEL
ALT: 20 U/L (ref 0–44)
AST: 22 U/L (ref 15–41)
Albumin: 3.6 g/dL (ref 3.5–5.0)
Alkaline Phosphatase: 62 U/L (ref 38–126)
Anion gap: 9 (ref 5–15)
BUN: 8 mg/dL (ref 8–23)
CO2: 21 mmol/L — ABNORMAL LOW (ref 22–32)
Calcium: 8.6 mg/dL — ABNORMAL LOW (ref 8.9–10.3)
Chloride: 107 mmol/L (ref 98–111)
Creatinine, Ser: 0.81 mg/dL (ref 0.44–1.00)
GFR, Estimated: >60 mL/min (ref >60)
Glucose, Bld: 205 mg/dL — ABNORMAL HIGH (ref 70–99)
Potassium: 3.7 mmol/L (ref 3.5–5.1)
Sodium: 137 mmol/L (ref 135–145)
Total Bilirubin: 0.8 mg/dL (ref 0.3–1.2)
Total Protein: 7.2 g/dL (ref 6.5–8.1)

## 2020-09-06 LAB — CBG MONITORING, ED: Glucose-Capillary: 222 mg/dL — ABNORMAL HIGH (ref 70–99)

## 2020-09-06 LAB — TSH: TSH: 2.123 u[IU]/mL (ref 0.350–4.500)

## 2020-09-06 MED ORDER — LACTATED RINGERS IV BOLUS
1000.0000 mL | Freq: Once | INTRAVENOUS | Status: AC
  Administered 2020-09-06: 1000 mL via INTRAVENOUS

## 2020-09-06 MED ORDER — MECLIZINE HCL 25 MG PO TABS
25.0000 mg | ORAL_TABLET | Freq: Once | ORAL | Status: AC
  Administered 2020-09-06: 25 mg via ORAL
  Filled 2020-09-06: qty 1

## 2020-09-06 MED ORDER — MECLIZINE HCL 25 MG PO TABS: 25.0000 mg | ORAL_TABLET | Freq: Three times a day (TID) | ORAL | 0 refills | Status: DC | PRN

## 2020-09-06 NOTE — ED Provider Notes (Signed)
[facility] DEPT Provider Note   CSN: [id number] Arrival date & time: 09/06/20  1200     History Chief Complaint  Patient presents with   Dizziness   Nausea    Jaime Boone is a 80 y.o. female with hx of cirrhosis, GERD, hypothyroidism, asthma, DM, HTN, OSA who presents with dizziness and nausea with onset today. Notes she had a cataract surgery yesterday. States that after going to her ophthalmologist's office around 7am she felt dizziness described as initially lightheadedness, then "eyes spinning." This is worse with positional changes. After returning home started having nausea which is worse with positional changes as well, seemed provoked by the dizziness. She notes having anesthesia yesterday for the procedure, but no other new medications. Has been in her regular state of health otherwise. Does also note that she was NPO after midnight for the procedure, and only had half her usual breakfast today as her blood sugar was elevated to 200. Has sometimes had vertigo before, but not recently. No known sick contacts. No falls or head injuries. No focal weakness, speech difficulty, headache, vomiting, abdominal pain, diarrhea.   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   Degenerative joint disease    Diabetes mellitus without complication ([place])    Hepatitis C    Hypertension    Sleep apnea     Patient Active Problem List   Diagnosis Date Noted   Pain due to onychomycosis of toenail of right foot 07/14/2019   Acute hepatitis 10/12/2016   Hypertension 10/12/2016   Sleep apnea 10/12/2016   Type 2 diabetes mellitus ([place]) 10/12/2016   Menopause 03/16/2016   Keratosis 04/17/2015   Vaginal lesion 03/14/2015   Sebaceous cyst of labia 04/06/2012   History of hepatitis C 04/06/2012   Constipation 04/06/2012   Postmenopausal  03/01/2012   Vaginal atrophy 03/01/2012    Past Surgical History:  Procedure Laterality Date   ABDOMINAL HYSTERECTOMY     TAH   APPENDECTOMY     DILATION AND CURETTAGE OF UTERUS     EYE SURGERY Left    CATARACT   FOOT SURGERY Bilateral    BUNIONECTOMY   GALLBLADDER SURGERY     keiloid removal      ear   NOSE SURGERY     oral stone extraction       OB History     Gravida  6   Para  3   Term  3   Preterm      AB  3   Living  3      SAB  3   IAB      Ectopic      Multiple      Live Births  3           Family History  Problem Relation Age of Onset   Hypertension Mother    Diabetes Mother    Hypertension Father    Diabetes Father    Heart disease Father    Prostate cancer Father    Diabetes Sister    Heart disease Brother    Prostate cancer Brother    Lung cancer Cousin    Colon cancer Neg Hx    Stomach cancer Neg Hx    Esophageal cancer Neg Hx    Pancreatic cancer Neg Hx  Liver disease Neg Hx     Social History   Tobacco Use   Smoking status: Former    Pack years: 0.00    Types: Cigarettes    Quit date: 03/01/1974    Years since quitting: 46.5   Smokeless tobacco: Never  Vaping Use   Vaping Use: Never used  Substance Use Topics   Alcohol use: Yes    Alcohol/week: 0.0 standard drinks    Comment: rare   Drug use: No    Home Medications Prior to Admission medications   Medication Sig Start Date End Date Taking? Authorizing Provider  meclizine (ANTIVERT) 25 MG tablet Take 1 tablet (25 mg total) by mouth 3 (three) times daily as needed for dizziness. 09/06/20  Yes Andrew Au, MD  Ascorbic Acid (VITAMIN C PO) Take by mouth.    [provider]  B-D ULTRAFINE III SHORT PEN 31G X 8 MM MISC SMARTSIG:2 Needle SUB-Q Daily 06/09/19   [provider]  Calcium Carbonate-Vitamin D (CALTRATE 600+D) 600-400 MG-UNIT per tablet Take 1 tablet by mouth daily.    [provider]  cetirizine (ZYRTEC) 10 MG tablet Take  10 mg by mouth daily after breakfast.    [provider]  cyclobenzaprine (FLEXERIL) 5 MG tablet Take 5 mg by mouth at bedtime as needed. 04/25/20   [provider]  fluticasone furoate-vilanterol (BREO ELLIPTA) 100-25 MCG/INH AEPB 1 puff by mouth daily 07/23/17   [provider]  gabapentin (NEURONTIN) 300 MG capsule Take 1 capsule (300 mg total) by mouth at bedtime. 06/02/18   Hyatt, Max T, DPM  glipiZIDE (GLUCOTROL) 5 MG tablet TAKE 2 TABLETS BY MOUTH EVERY MORNING AND 1 TABLET EVERY EVENING 06/27/19   [provider]  Lancets (ONETOUCH DELICA PLUS [id number]) MISC U TO TEST BS BID 05/15/18   [provider]  LANTUS SOLOSTAR 100 UNIT/ML Solostar Pen SMARTSIG:35 Unit(s) SUB-Q Daily 02/27/19   [provider]  levothyroxine (SYNTHROID, LEVOTHROID) 50 MCG tablet Take 50 mcg by mouth daily before breakfast.  04/27/13   [provider]  losartan (COZAAR) 50 MG tablet Take 25 mg by mouth 2 (two) times daily. 06/26/19   [provider]  methocarbamol (ROBAXIN) 500 MG tablet Take 1 tablet (500 mg total) by mouth 2 (two) times daily. 01/26/17   Maczis, Barth Kirks, PA-C  montelukast (SINGULAIR) 10 MG tablet Take 10 mg by mouth daily after breakfast.     [provider]  Multiple Vitamin (MULTIVITAMIN) capsule Take 1 capsule by mouth daily.    [provider]  Multiple Vitamins-Minerals (DAILY MULTIVITAMIN) CAPS one capsule PO daily 07/14/16   [provider]  ONE TOUCH ULTRA TEST test strip  04/03/13   [provider]  pantoprazole (PROTONIX) 40 MG tablet Take 40 mg by mouth 2 (two) times daily.     [provider]  PROAIR HFA 108 (90 BASE) MCG/ACT inhaler Inhale 2 puffs into the lungs every 6 (six) hours as needed for wheezing or shortness of breath.  04/19/13   [provider]  triamcinolone cream (KENALOG) 0.1 % Apply 1 application topically daily as needed (skin irritation).  09/18/13    [provider]    Allergies    Aspirin, Ciprofloxacin, Codeine, Nitrofuran derivatives, and Penicillins  Review of Systems   Review of Systems  Constitutional:  Negative for chills and fever.  HENT:  Negative for congestion, ear discharge, ear pain, sore throat and tinnitus.   Eyes:  Negative for visual disturbance.  Respiratory:  Negative for shortness of breath.   Cardiovascular:  Negative for chest pain, palpitations and leg swelling.  Gastrointestinal:  Positive for nausea. Negative for abdominal pain, diarrhea and vomiting.  Neurological:  Positive for dizziness and light-headedness. Negative for facial asymmetry, speech difficulty, weakness, numbness and headaches.  All other systems reviewed and are negative.  Physical Exam Updated Vital Signs BP (!) 163/91   Pulse 71   Temp 97.9 F (36.6 C) (Oral)   Resp 18   Ht 5\' 3"  (1.6 m)   Wt 99.3 kg   SpO2 99%   BMI 38.79 kg/m   Physical Exam Vitals and nursing note reviewed.  Constitutional:      Appearance: Normal appearance.  HENT:     Head: Normocephalic and atraumatic.     Right Ear: Tympanic membrane, ear canal and external ear normal.     Left Ear: Tympanic membrane, ear canal and external ear normal.     Nose: Nose normal.     Mouth/Throat:     Comments: Mucous membranes mildly dry Eyes:     Extraocular Movements: Extraocular movements intact.  Cardiovascular:     Rate and Rhythm: Normal rate and regular rhythm.     Pulses: Normal pulses.     Heart sounds: No murmur heard.   No friction rub. No gallop.  Pulmonary:     Effort: Pulmonary effort is normal. No respiratory distress.     Breath sounds: No wheezing, rhonchi or rales.  Abdominal:     General: Abdomen is flat.  Musculoskeletal:        General: No swelling or deformity. Normal range of motion.     Cervical back: Normal range of motion and neck supple.  Skin:    General: Skin is warm and dry.     Comments: No skin tenting  Neurological:      General: No focal deficit present.     Mental Status: She is alert and oriented to person, place, and time.     Cranial Nerves: No cranial nerve deficit.     Motor: No weakness.     Comments: Negative Dix-Hallpike   Psychiatric:        Mood and Affect: Mood normal.        Behavior: Behavior normal.    ED Results / Procedures / Treatments   Labs (all labs ordered are listed, but only abnormal results are displayed) Labs Reviewed  COMPREHENSIVE METABOLIC PANEL - Abnormal; Notable for the following components:      Result Value   CO2 21 (*)    Glucose, Bld 205 (*)    Calcium 8.6 (*)    All other components within normal limits  CBC WITH DIFFERENTIAL/PLATELET - Abnormal; Notable for the following components:   RBC 5.24 (*)    Hemoglobin 11.0 (*)    HCT 35.3 (*)    MCV 67.4 (*)    MCH 21.0 (*)    RDW 16.0 (*)    All other components within normal limits  CBG MONITORING, ED - Abnormal; Notable for the following components:   Glucose-Capillary 222 (*)    All other components within normal limits  TSH    EKG None  Radiology No results found.  Procedures Procedures   Medications Ordered in ED Medications  lactated ringers bolus 1,000 mL (1,000 mLs Intravenous New Bag/Given (Non-Interop) 09/06/20 1948)  meclizine (ANTIVERT) tablet 25 mg (25 mg Oral Given 09/06/20 1950)    ED Course  I have reviewed the triage  vital signs and the nursing notes.  Pertinent labs & imaging results that were available during my care of the patient were reviewed by me and considered in my medical decision making (see chart for details).    MDM Rules/Calculators/A&P                          Patient presents with dizziness and nausea. Noted to have had anesthesia for cataract surgery yesterday, and decreased PO intake in preparation for that procedure and halved her breakfast today for blood sugar of 205. No other GI symptoms. No other neuro symptoms including weakness, speech difficulty,  facial droop. Symptoms improved after IV fluids and zofran from EMS.   Ddx includes anesthesia effect vs dehydration vs BPPV vs stroke. Timeline seems unusual for an effect of anesthesia, but perhaps she was still sedated to some degree and did not notice symptoms until today. Stroke unlikely with isolated symptom of dizziness. Will check CMP, CBC, TSH. Ordered IV fluids, meclizine. Will ambulate her afterwards and see how symptoms are. If not improved, will order MRI to evaluate for stroke.  Patient feeling better with IV fluids and meclizine. She ambulated to bathroom and back without recurrence of dizziness. Most likely was due to lingering anesthesia vs dehydration. Discharging with follow up with PCP. Given return precautions.   Final Clinical Impression(s) / ED Diagnoses Final diagnoses:  Dehydration  Complication of anesthesia, initial encounter    Rx / DC Orders ED Discharge Orders          Ordered    meclizine (ANTIVERT) 25 MG tablet  3 times daily PRN        09/06/20 2101             Andrew Au, MD 09/06/20 2122    Deno Etienne, DO 09/06/20 2124

## 2020-09-06 NOTE — ED Triage Notes (Signed)
Patient presents post cataract surgery yesterday. Today she went for a follow up and found she was dizzy while in the office. Vitals were normal during her follow up. Once at home she noticed nausea. EMS found her in her bed. They administered 4 ng of Zofran and 500 ml of fluid. She reported having a CBG of 315.  EMS vitals: 278 CBG 150/96 BP 80 HR 99% SPO2 on room air

## 2020-09-06 NOTE — Discharge Instructions (Addendum)
Ms. Schraeder, it was a pleasure taking care of you. Your testing was all reassuring. At this point, it seems the most likely explanation is an effect of the anesthesia or dehydration, or both. I am sending a prescription for meclizine which you can take if you have more dizziness. Please follow up with your PCP.

## 2020-09-06 NOTE — ED Notes (Signed)
D/c after fluids per MD

## 2020-11-20 ENCOUNTER — Encounter: Payer: Self-pay | Admitting: Physical Therapy

## 2020-11-20 ENCOUNTER — Other Ambulatory Visit: Payer: Self-pay

## 2020-11-20 ENCOUNTER — Ambulatory Visit: Payer: Medicare Other | Attending: Family Medicine | Admitting: Physical Therapy

## 2020-11-20 DIAGNOSIS — G8929 Other chronic pain: Secondary | ICD-10-CM | POA: Diagnosis present

## 2020-11-20 DIAGNOSIS — M6281 Muscle weakness (generalized): Secondary | ICD-10-CM | POA: Diagnosis present

## 2020-11-20 DIAGNOSIS — M25562 Pain in left knee: Secondary | ICD-10-CM | POA: Diagnosis present

## 2020-11-20 DIAGNOSIS — M25561 Pain in right knee: Secondary | ICD-10-CM | POA: Diagnosis present

## 2020-11-20 NOTE — Therapy (Signed)
[place], [place], [address] Phone: [phone]   Fax:  [phone]  Physical Therapy Evaluation  Patient Details  Name: Jaime Boone MRN: [id number] Date of Birth: Dec 18, 1940 Referring Provider (PT): Geoffery Lyons, NP   Encounter Date: 11/20/2020   PT End of Session - 11/20/20 1416     Visit Number 1    Number of Visits 13    Date for PT Re-Evaluation 01/15/21    Authorization Type MCR: kx mod at 15th visit, FOTO 6th and 10th    Progress Note Due on Visit 10    PT Start Time 1415    PT Stop Time 1456    PT Time Calculation (min) 41 min    Activity Tolerance Patient tolerated treatment well    Behavior During Therapy [facility] for tasks assessed/performed             Past Medical History:  Diagnosis Date   Acid reflux    Arthritis    Asthma    Cirrhosis ([place])    Connective tissue disease ([place])    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   Degenerative joint disease    Diabetes mellitus without complication ([place])    Hepatitis C    Hypertension    Sleep apnea     Past Surgical History:  Procedure Laterality Date   ABDOMINAL HYSTERECTOMY     TAH   APPENDECTOMY     DILATION AND CURETTAGE OF UTERUS     EYE SURGERY Left    CATARACT   FOOT SURGERY Bilateral    BUNIONECTOMY   GALLBLADDER SURGERY     keiloid removal      ear   NOSE SURGERY     oral stone extraction      There were no vitals filed for this visit.    Subjective Assessment - 11/20/20 1419     Subjective pt reports having bil knee pain with in both knees L >R and had shot in both knees on 10/23/2020, which significantly helped her R knee. The L knee started hurting recently with no specific MOI. she reports pain is worse with standing/ walking. she does describ having increaesd neck pain and cramp. she reports feeling more weak in the L.    How long can you sit comfortably? 30-45 min    How long can you stand comfortably? 10 - 15  min    How long can you walk comfortably? 10 -15    Diagnostic tests xray at MD's office    Patient Stated Goals to decrease pain    Currently in Pain? Yes    Pain Score 0-No pain   at worst 8/10   Pain Location Back    Pain Orientation Right;Left    Pain Type Chronic pain    Aggravating Factors  unsure of aggrivating factor    Pain Relieving Factors exercise    Effect of Pain on Daily Activities limited standing, walking.                [facility] PT Assessment - 11/20/20 1434       Assessment   Medical Diagnosis Unspecified osteoarthritis, unspecified site    Referring Provider (PT) Geoffery Lyons, NP    Onset Date/Surgical Date --   for a couple months   Hand Dominance Right    Next MD Visit --   September 2022   Prior Therapy yes      Precautions   Precautions None  Restrictions   Weight Bearing Restrictions No      Balance Screen   Has the patient fallen in the past 6 months No      [place] residence    Living Arrangements Alone      Prior Function   Level of Independence Independent with basic ADLs      Cognition   Overall Cognitive Status Difficult to assess      Observation/Other Assessments   Focus on Therapeutic Outcomes (FOTO)  38% function spare 70.4   51% predicted     Posture/Postural Control   Posture/Postural Control Postural limitations      ROM / Strength   AROM / PROM / Strength AROM;Strength      AROM   AROM Assessment Site Knee    Right/Left Knee Right;Left    Right Knee Extension 2    Right Knee Flexion 123    Left Knee Extension 118    Left Knee Flexion 0      Strength   Strength Assessment Site Hip;Knee    Right/Left Hip Right;Left    Right Hip Flexion 4-/5    Right Hip Extension 4-/5    Right Hip ABduction 4-/5    Right Hip ADduction 4/5    Left Hip Flexion 4-/5    Left Hip Extension 4-/5    Left Hip ABduction 4-/5    Left Hip ADduction 4/5    Right/Left Knee Right;Left     Right Knee Flexion 4-/5    Right Knee Extension 4-/5    Left Knee Flexion 3+/5    Left Knee Extension 3+/5   pain during testing     Palpation   Patella mobility lateral gliding patella bil    Palpation comment TTP along the patellofemoral joint,                        Objective measurements completed on examination: See above findings.               PT Education - 11/20/20 1431     Education Details evaluation findings, POC, goals, HEP with proper form. FOTO assessment    Person(s) Educated Patient    Methods Explanation;Verbal cues;Handout    Comprehension Verbalized understanding;Verbal cues required              PT Short Term Goals - 11/20/20 1500       PT SHORT TERM GOAL #1   Title pt to be IND with initial HEP    Baseline -    Time 3    Period Weeks    Status New    Target Date 12/11/20      PT SHORT TERM GOAL #2   Title pt to verbalize/ demo efficient posture and lifting mechanics to reduce and prevent knee pain    Time 3    Period Weeks    Status New    Target Date 01/01/21      PT SHORT TERM GOAL #3   Title -    Baseline -      PT SHORT TERM GOAL #4   Title -    Baseline -               PT Long Term Goals - 11/20/20 1640       PT LONG TERM GOAL #1   Title increase gross hip / knee strenth to >/= 4+/5 to promote patellar stability with walking /  standing.    Period Weeks    Status New    Target Date 01/01/21      PT LONG TERM GOAL #2   Title pt to be able to walk/ stand for >/= 45 min and ascend/ descen >/=12 steps with </= 2/10 max pain in the knee for functional endurance required for ADLs and community ambulation    Time 6    Period Weeks    Status New    Target Date 01/01/21      PT LONG TERM GOAL #3   Title increase FOTO score to >/= 56% to demo improvement in function    Time 6    Period Weeks    Status New      PT LONG TERM GOAL #4   Title improve spare pain sore to </= 60 to demo improvement in  pain control and awareness.    Baseline inital sore 70.4    Time 6    Period Weeks    Status New    Target Date 01/01/21      PT LONG TERM GOAL #5   Title pt to be IND with advanced HEP to be able to maintain and progress current LOF IND    Time 6    Period Weeks    Status New    Target Date 01/01/21                    Plan - 11/20/20 1447     Clinical Impression Statement pt is a pleasant 80 y.o F presenting to [facility]  with cc of bil knee pain with L>R. She has functional R knee ROM and mild limitation in L knee compared bil, TTP noted along the lateral patellorfemoral joint and responded well to medial patellar glide. MMT revealed general weakness bil with L>R. She would benefit from physical therapy to reduce knee pain, improve gross hip/ knee strength and maximize her function by addressing the deficits listed.    Personal Factors and Comorbidities Comorbidity 2;Age    Comorbidities hx of DM, arthritis    Stability/Clinical Decision Making Evolving/Moderate complexity    Clinical Decision Making Moderate    Rehab Potential Good    PT Frequency 2x / week    PT Duration 6 weeks    PT Treatment/Interventions ADLs/Self Care Home Management;Cryotherapy;Electrical Stimulation;Iontophoresis '4mg'$ /ml Dexamethasone;Moist Heat;Ultrasound;Gait training;Stair training;Therapeutic activities;Therapeutic exercise;Balance training;Neuromuscular re-education;Manual techniques;Patient/family education;Passive range of motion;Dry needling;Taping    PT Next Visit Plan review/update HEP PRN, trialed McConnel taping? gross hip/ knee strengthning, gait/ stair training.    PT Home Exercise Plan [id number] - quad set with ball/pillow squeeze, bridge with adductor squeeze, SLR with ER, and sidelying hip abduction.    Consulted and Agree with Plan of Care Patient             Patient will benefit from skilled therapeutic intervention in order to improve the following deficits and impairments:   Improper body mechanics, Increased muscle spasms, Decreased strength, Postural dysfunction, Pain, Decreased activity tolerance, Decreased endurance, Decreased range of motion, Abnormal gait  Visit Diagnosis: Chronic pain of left knee  Chronic pain of right knee  Muscle weakness (generalized)     Problem List Patient Active Problem List   Diagnosis Date Noted   Pain due to onychomycosis of toenail of right foot 07/14/2019   Acute hepatitis 10/12/2016   Hypertension 10/12/2016   Sleep apnea 10/12/2016   Type 2 diabetes mellitus ([place]) 10/12/2016   Menopause 03/16/2016   Keratosis 04/17/2015  Vaginal lesion 03/14/2015   Sebaceous cyst of labia 04/06/2012   History of hepatitis C 04/06/2012   Constipation 04/06/2012   Postmenopausal 03/01/2012   Vaginal atrophy 03/01/2012   Starr Lake PT, DPT, LAT, ATC  11/20/20  4:47 PM     [facility] [address] [place], [place], [address] Phone: [phone]   Fax:  [phone]  Name: Jaime Boone MRN: [id number] Date of Birth: [birth year]

## 2020-11-26 ENCOUNTER — Encounter: Payer: Self-pay | Admitting: Physical Therapy

## 2020-11-26 ENCOUNTER — Ambulatory Visit: Payer: Medicare Other | Admitting: Physical Therapy

## 2020-11-26 ENCOUNTER — Other Ambulatory Visit: Payer: Self-pay

## 2020-11-26 DIAGNOSIS — M25562 Pain in left knee: Secondary | ICD-10-CM

## 2020-11-26 DIAGNOSIS — M6281 Muscle weakness (generalized): Secondary | ICD-10-CM

## 2020-11-26 DIAGNOSIS — M25561 Pain in right knee: Secondary | ICD-10-CM

## 2020-11-26 DIAGNOSIS — G8929 Other chronic pain: Secondary | ICD-10-CM

## 2020-11-26 NOTE — Therapy (Signed)
[place], [place], [address] Phone: [phone]   Fax:  [phone]  Physical Therapy Treatment  Patient Details  Name: Jaime Boone MRN: [id number] Date of Birth: 1940/05/14 Referring Provider (PT): Geoffery Lyons, NP   Encounter Date: 11/26/2020   PT End of Session - 11/26/20 0717     Visit Number 2    Number of Visits 13    Date for PT Re-Evaluation 01/15/21    Authorization Type MCR: kx mod at 15th visit, FOTO 6th and 10th    Progress Note Due on Visit 10    PT Start Time 0715             Past Medical History:  Diagnosis Date   Acid reflux    Arthritis    Asthma    Cirrhosis ([place])    Connective tissue disease ([place])    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   Degenerative joint disease    Diabetes mellitus without complication ([place])    Hepatitis C    Hypertension    Sleep apnea     Past Surgical History:  Procedure Laterality Date   ABDOMINAL HYSTERECTOMY     TAH   APPENDECTOMY     DILATION AND CURETTAGE OF UTERUS     EYE SURGERY Left    CATARACT   FOOT SURGERY Bilateral    BUNIONECTOMY   GALLBLADDER SURGERY     keiloid removal      ear   NOSE SURGERY     oral stone extraction      There were no vitals filed for this visit.   Subjective Assessment - 11/26/20 0716     Subjective The right knee was heavy. I have discomfort in the knees that comes and goes. I am having back spasms which have kept me from doing my exercises.    Currently in Pain? No/denies                               [facility] Adult PT Treatment/Exercise - 11/26/20 0001       Knee/Hip Exercises: Aerobic   Nustep L2 5 minutes UE/LE      Knee/Hip Exercises: Seated   Long Arc Quad 10 reps    Long Arc Quad Weight 0 lbs.    Long [employer] Limitations with ball squeeze    Hamstring Curl 10 reps;2 sets    Hamstring Limitations red band      Knee/Hip Exercises: Supine   Quad Sets 10  reps;Both    Quad Sets Limitations with ball squeeze    Heel Slides 10 reps;Right;Left    Heel Slides Limitations with quad set on extension    Bridges 10 reps    Bridges Limitations with ball squeeze x 10    Straight Leg Raises 10 reps;1 set;Left;Right    Straight Leg Raise with External Rotation 10 reps;1 set;Left;Right      Knee/Hip Exercises: Sidelying   Hip ABduction 10 reps;2 sets                      PT Short Term Goals - 11/20/20 1500       PT SHORT TERM GOAL #1   Title pt to be IND with initial HEP    Baseline -    Time 3    Period Weeks    Status New    Target Date 12/11/20  PT SHORT TERM GOAL #2   Title pt to verbalize/ demo efficient posture and lifting mechanics to reduce and prevent knee pain    Time 3    Period Weeks    Status New    Target Date 01/01/21      PT SHORT TERM GOAL #3   Title -    Baseline -      PT SHORT TERM GOAL #4   Title -    Baseline -               PT Long Term Goals - 11/20/20 1640       PT LONG TERM GOAL #1   Title increase gross hip / knee strenth to >/= 4+/5 to promote patellar stability with walking / standing.    Period Weeks    Status New    Target Date 01/01/21      PT LONG TERM GOAL #2   Title pt to be able to walk/ stand for >/= 45 min and ascend/ descen >/=12 steps with </= 2/10 max pain in the knee for functional endurance required for ADLs and community ambulation    Time 6    Period Weeks    Status New    Target Date 01/01/21      PT LONG TERM GOAL #3   Title increase FOTO score to >/= 56% to demo improvement in function    Time 6    Period Weeks    Status New      PT LONG TERM GOAL #4   Title improve spare pain sore to </= 60 to demo improvement in pain control and awareness.    Baseline inital sore 70.4    Time 6    Period Weeks    Status New    Target Date 01/01/21      PT LONG TERM GOAL #5   Title pt to be IND with advanced HEP to be able to maintain and progress current  LOF IND    Time 6    Period Weeks    Status New    Target Date 01/01/21                   Plan - 11/26/20 0729     Clinical Impression Statement Pt arrives reporting non-compliance with HEP. She reports her back pain limits her compliance. Educated pt on benefits her HEP can have on her back. She has not yet contacted MD to add her low back to current POC. SHe arrives with general discomfort in knees today. Began Nustep  which she tolerated without increased pain. Time spent with review of HEP which she also tolerated well. Progressed with seated knee strengthening. At end of session, pt reported feeling good. Encouraged daily compliance with HEP.    PT Next Visit Plan review/update HEP PRN, trialed McConnel taping? gross hip/ knee strengthning, gait/ stair training.    PT Home Exercise Plan [id number] - quad set with ball/pillow squeeze, bridge with adductor squeeze, SLR with ER, and sidelying hip abduction.    Consulted and Agree with Plan of Care Patient             Patient will benefit from skilled therapeutic intervention in order to improve the following deficits and impairments:  Improper body mechanics, Increased muscle spasms, Decreased strength, Postural dysfunction, Pain, Decreased activity tolerance, Decreased endurance, Decreased range of motion, Abnormal gait  Visit Diagnosis: Chronic pain of left knee  Chronic pain of right knee  Muscle weakness (generalized)     Problem List Patient Active Problem List   Diagnosis Date Noted   Pain due to onychomycosis of toenail of right foot 07/14/2019   Acute hepatitis 10/12/2016   Hypertension 10/12/2016   Sleep apnea 10/12/2016   Type 2 diabetes mellitus ([place]) 10/12/2016   Menopause 03/16/2016   Keratosis 04/17/2015   Vaginal lesion 03/14/2015   Sebaceous cyst of labia 04/06/2012   History of hepatitis C 04/06/2012   Constipation 04/06/2012   Postmenopausal 03/01/2012   Vaginal atrophy 03/01/2012     Dorene Ar, PTA 11/26/2020, 8:03 AM  [facility] [address] [place], [place], [address] Phone: [phone]   Fax:  [phone]  Name: Jaime Boone MRN: [id number] Date of Birth: May 11, 1940

## 2020-11-28 ENCOUNTER — Encounter: Payer: Self-pay | Admitting: Physical Therapy

## 2020-11-28 ENCOUNTER — Other Ambulatory Visit: Payer: Self-pay

## 2020-11-28 ENCOUNTER — Ambulatory Visit: Payer: Medicare Other | Attending: Family Medicine | Admitting: Physical Therapy

## 2020-11-28 VITALS — BP 154/93

## 2020-11-28 DIAGNOSIS — M25562 Pain in left knee: Secondary | ICD-10-CM | POA: Diagnosis not present

## 2020-11-28 DIAGNOSIS — G8929 Other chronic pain: Secondary | ICD-10-CM | POA: Diagnosis present

## 2020-11-28 DIAGNOSIS — M25512 Pain in left shoulder: Secondary | ICD-10-CM | POA: Diagnosis present

## 2020-11-28 DIAGNOSIS — M545 Low back pain, unspecified: Secondary | ICD-10-CM | POA: Insufficient documentation

## 2020-11-28 DIAGNOSIS — M6281 Muscle weakness (generalized): Secondary | ICD-10-CM

## 2020-11-28 DIAGNOSIS — M25561 Pain in right knee: Secondary | ICD-10-CM | POA: Diagnosis present

## 2020-11-28 NOTE — Therapy (Signed)
[place], [place], [address] Phone: [phone]   Fax:  [phone]  Physical Therapy Treatment  Patient Details  Name: Jaime Boone MRN: [id number] Date of Birth: [birth year] Referring Provider (PT): Geoffery Lyons, NP   Encounter Date: 11/28/2020   PT End of Session - 11/28/20 0723     Visit Number 3    Number of Visits 13    Date for PT Re-Evaluation 01/15/21    Authorization Type MCR: kx mod at 15th visit, FOTO 6th and 10th    Progress Note Due on Visit 10    PT Start Time 0715    PT Stop Time 0755    PT Time Calculation (min) 40 min             Past Medical History:  Diagnosis Date   Acid reflux    Arthritis    Asthma    Cirrhosis ([place])    Connective tissue disease ([place])    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   Degenerative joint disease    Diabetes mellitus without complication ([place])    Hepatitis C    Hypertension    Sleep apnea     Past Surgical History:  Procedure Laterality Date   ABDOMINAL HYSTERECTOMY     TAH   APPENDECTOMY     DILATION AND CURETTAGE OF UTERUS     EYE SURGERY Left    CATARACT   FOOT SURGERY Bilateral    BUNIONECTOMY   GALLBLADDER SURGERY     keiloid removal      ear   NOSE SURGERY     oral stone extraction      Vitals:   11/28/20 0722  BP: (!) 154/93     Subjective Assessment - 11/28/20 0720     Subjective I woke up yesterday with the spasms back in my buttocks. My doctor is going to call me today and I am going to tell them about by back and hip and neck. I want to focus on my back hips and neck.    Currently in Pain? Yes    Pain Score 5     Pain Location Buttocks    Pain Orientation Right;Left    Pain Descriptors / Indicators Aching    Pain Type Chronic pain    Aggravating Factors  unsure , random    Pain Relieving Factors therarub                               [facility] Adult PT Treatment/Exercise - 11/28/20 0001        Transfers   Transfer Cueing supine-sit transfers x 2 with cues for log roll to avoid strain on spine      Self-Care   Self-Care Other Self-Care Comments    Other Self-Care Comments  USe of tennis ball to trigger points in gluteals.      Knee/Hip Exercises: Stretches   Other Knee/Hip Stretches Knee to chest, gentle active figure 4 with Hip IR and Er ,      Knee/Hip Exercises: Aerobic   Nustep L2 7 minutes UE/LE      Knee/Hip Exercises: Supine   Heel Slides 10 reps;Right;Left    Heel Slides Limitations with quad set on extension    Bridges Limitations gluteal sets in hooklying x 10    Straight Leg Raises 10 reps;1 set;Left;Right    Straight Leg Raises Limitations with cues for abdominal draw in  Other Supine Knee/Hip Exercises supine clam with red band and abdominal draw in 10 x 2                      PT Short Term Goals - 11/20/20 1500       PT SHORT TERM GOAL #1   Title pt to be IND with initial HEP    Baseline -    Time 3    Period Weeks    Status New    Target Date 12/11/20      PT SHORT TERM GOAL #2   Title pt to verbalize/ demo efficient posture and lifting mechanics to reduce and prevent knee pain    Time 3    Period Weeks    Status New    Target Date 01/01/21      PT SHORT TERM GOAL #3   Title -    Baseline -      PT SHORT TERM GOAL #4   Title -    Baseline -               PT Long Term Goals - 11/20/20 1640       PT LONG TERM GOAL #1   Title increase gross hip / knee strenth to >/= 4+/5 to promote patellar stability with walking / standing.    Period Weeks    Status New    Target Date 01/01/21      PT LONG TERM GOAL #2   Title pt to be able to walk/ stand for >/= 45 min and ascend/ descen >/=12 steps with </= 2/10 max pain in the knee for functional endurance required for ADLs and community ambulation    Time 6    Period Weeks    Status New    Target Date 01/01/21      PT LONG TERM GOAL #3   Title increase FOTO score to  >/= 56% to demo improvement in function    Time 6    Period Weeks    Status New      PT LONG TERM GOAL #4   Title improve spare pain sore to </= 60 to demo improvement in pain control and awareness.    Baseline inital sore 70.4    Time 6    Period Weeks    Status New    Target Date 01/01/21      PT LONG TERM GOAL #5   Title pt to be IND with advanced HEP to be able to maintain and progress current LOF IND    Time 6    Period Weeks    Status New    Target Date 01/01/21                   Plan - 11/28/20 0724     Clinical Impression Statement Pt reports soreness in knees more in the left than the right. She notes increased spasms in trunk and buttocks. She is working with MD to add neck/shoulder/lumbar and hips to her current POC. Continued per current POC for knee and hip strengthening. Incorporated gentle hip stretching to decrease spasms in gluteals.. Instructed pt in self TPR with tennis ball to decrease her reported soreness in pressure points. She had spasms in trunk  with transitions on and off mat and was instructed in log roll. She had spasm in right trunk with sidelying to sit at end of session. She was encouraged to use heat at home.  PT Next Visit Plan review/update HEP PRN, trialed McConnel taping? gross hip/ knee strengthning, gait/ stair training. PNE    PT Home Exercise Plan [id number] - quad set with ball/pillow squeeze, bridge with adductor squeeze, SLR with ER, and sidelying hip abduction.    Consulted and Agree with Plan of Care Patient             Patient will benefit from skilled therapeutic intervention in order to improve the following deficits and impairments:  Improper body mechanics, Increased muscle spasms, Decreased strength, Postural dysfunction, Pain, Decreased activity tolerance, Decreased endurance, Decreased range of motion, Abnormal gait  Visit Diagnosis: Chronic pain of left knee  Chronic pain of right knee  Muscle weakness  (generalized)     Problem List Patient Active Problem List   Diagnosis Date Noted   Pain due to onychomycosis of toenail of right foot 07/14/2019   Acute hepatitis 10/12/2016   Hypertension 10/12/2016   Sleep apnea 10/12/2016   Type 2 diabetes mellitus ([place]) 10/12/2016   Menopause 03/16/2016   Keratosis 04/17/2015   Vaginal lesion 03/14/2015   Sebaceous cyst of labia 04/06/2012   History of hepatitis C 04/06/2012   Constipation 04/06/2012   Postmenopausal 03/01/2012   Vaginal atrophy 03/01/2012    Dorene Ar, PTA 11/28/2020, 8:51 AM  [facility] [address] [place], [place], [address] Phone: [phone]   Fax:  [phone]  Name: Jaime Boone MRN: [id number] Date of Birth: 07-Mar-1941

## 2020-12-04 ENCOUNTER — Encounter: Payer: Self-pay | Admitting: Physical Therapy

## 2020-12-04 ENCOUNTER — Other Ambulatory Visit: Payer: Self-pay

## 2020-12-04 ENCOUNTER — Ambulatory Visit: Payer: Medicare Other | Admitting: Physical Therapy

## 2020-12-04 DIAGNOSIS — M25562 Pain in left knee: Secondary | ICD-10-CM | POA: Diagnosis not present

## 2020-12-04 DIAGNOSIS — G8929 Other chronic pain: Secondary | ICD-10-CM

## 2020-12-04 DIAGNOSIS — M545 Low back pain, unspecified: Secondary | ICD-10-CM

## 2020-12-04 DIAGNOSIS — M25512 Pain in left shoulder: Secondary | ICD-10-CM

## 2020-12-04 DIAGNOSIS — M6281 Muscle weakness (generalized): Secondary | ICD-10-CM

## 2020-12-04 NOTE — Therapy (Signed)
[place], [place], [address] Phone: [phone]   Fax:  [phone]  Physical Therapy Treatment / Re-certification  Patient Details  Name: Jaime Boone MRN: [id number] Date of Birth: 26-Jan-1941 Referring Provider (PT): Geoffery Lyons, NP   Encounter Date: 12/04/2020   PT End of Session - 12/04/20 0936     Visit Number 4    Number of Visits 16    Date for PT Re-Evaluation 01/15/21    Authorization Type MCR: kx mod at 15th visit, FOTO 6th and 10th    Progress Note Due on Visit 10    PT Start Time 0931    PT Stop Time 1012    PT Time Calculation (min) 41 min    Activity Tolerance Patient tolerated treatment well    Behavior During Therapy [facility] for tasks assessed/performed             Past Medical History:  Diagnosis Date   Acid reflux    Arthritis    Asthma    Cirrhosis ([place])    Connective tissue disease ([place])    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   Degenerative joint disease    Diabetes mellitus without complication ([place])    Hepatitis C    Hypertension    Sleep apnea     Past Surgical History:  Procedure Laterality Date   ABDOMINAL HYSTERECTOMY     TAH   APPENDECTOMY     DILATION AND CURETTAGE OF UTERUS     EYE SURGERY Left    CATARACT   FOOT SURGERY Bilateral    BUNIONECTOMY   GALLBLADDER SURGERY     keiloid removal      ear   NOSE SURGERY     oral stone extraction      There were no vitals filed for this visit.   Subjective Assessment - 12/04/20 0937     Subjective pt arrives to PT with new script to include her L shoulder, low back and R buttock pain. She reports the low back pain has been on and off for years and noted she had DN previously which helped, also was provided a heel lift previously which helped but is unable to locate it now. She reports feeling pain in the back with assocaited pain located in the R hip but describes it more as spams.  She denies any red flags  regarding the back. The L shoulder pain started that started in May / June of 2022 noting it feels like the neck wants to lock and is unsure what causes it.    How long can you sit comfortably? 30-45 min    How long can you stand comfortably? 10 - 15 min    How long can you walk comfortably? 10 -15    Diagnostic tests xray at MD's office    Patient Stated Goals to decrease pain    Currently in Pain? Yes    Pain Score 4     Pain Location Shoulder    Pain Orientation Left    Pain Descriptors / Indicators Aching;Tightness;Sore    Pain Type Chronic pain    Aggravating Factors  moving around, prolonged sitting doing crossword puzzle    Pain Relieving Factors theraworx topical, voltaren,    Multiple Pain Sites Yes    Pain Score 0   at worst 8-9/10   Pain Location Back    Pain Orientation Mid;Lower    Pain Descriptors / Indicators Aching;Tightness    Pain Type  Chronic pain    Pain Onset More than a month ago    Pain Frequency Intermittent    Aggravating Factors  prolonged standing/ walking, doing some kind of activity    Pain Relieving Factors sitting and resting    Effect of Pain on Daily Activities limited standing/ walking tolerance    Pain Score 0   at worst 7/10   Pain Location Hip    Pain Orientation Right;Posterior    Pain Descriptors / Indicators Aching;Sore;Spasm    Pain Type Chronic pain    Pain Onset More than a month ago    Pain Frequency Intermittent    Aggravating Factors  prolonged standing walking/ activity    Pain Relieving Factors theraworx    Effect of Pain on Daily Activities limited standing/ walking                [facility] PT Assessment - 12/04/20 0001       Assessment   Medical Diagnosis Unspecified osteoarthritis, unspecified site, L upper trap, low back pain, and R buttock pain.    Referring Provider (PT) Geoffery Lyons, NP    Hand Dominance Right      AROM   AROM Assessment Site Shoulder    Right/Left Shoulder --   [facility] bil     Strength    Strength Assessment Site Shoulder    Right/Left Shoulder Right;Left    Right Shoulder Flexion 3+/5    Right Shoulder Extension 3+/5    Right Shoulder ABduction 3+/5    Right Shoulder Internal Rotation 3+/5    Right Shoulder External Rotation 3+/5    Left Shoulder Flexion 3+/5    Left Shoulder Extension 3+/5    Left Shoulder ABduction 3+/5    Left Shoulder Internal Rotation 3+/5    Left Shoulder External Rotation 3+/5    Right Hip Flexion 4-/5    Right Hip Extension 4-/5    Right Hip ABduction 4-/5    Right Hip ADduction 4/5    Left Hip Flexion 4-/5    Left Hip ABduction 4-/5    Left Hip ADduction 4/5    Right Knee Flexion 4-/5    Right Knee Extension 4-/5    Left Knee Flexion 3+/5    Left Knee Extension 3+/5      Palpation   Palpation comment TTP along the patellofemoral joint, trigger points noted in bil upper trap/ levator scapulae with L>R, sub-occipitals                           [facility] Adult PT Treatment/Exercise - 12/04/20 0001       Neck Exercises: Seated   Other Seated Exercise scapular retraction 2 x 10      Neck Exercises: Supine   Neck Retraction 10 reps;5 secs      Neck Exercises: Stretches   Upper Trapezius Stretch 2 reps;Left;30 seconds    Levator Stretch Left;2 reps;30 seconds      Manual Therapy   Manual therapy comments MTPR along the L upper trap and levator scapulae stretch 1 x 30 sec ea.                  Upper Extremity Functional Index Score :   /80   PT Education - 12/04/20 0955     Education Details Reviewed reassessment findings, updated POC, and goals.    Person(s) Educated Patient    Methods Explanation  PT Short Term Goals - 12/04/20 1005       PT SHORT TERM GOAL #1   Title pt to be IND with initial HEP    Period Weeks    Status On-going      PT SHORT TERM GOAL #2   Title pt to verbalize/ demo efficient posture and lifting mechanics to reduce and prevent knee pain    Period Weeks     Status On-going               PT Long Term Goals - 12/04/20 1019       PT LONG TERM GOAL #1   Title increase gross hip / knee strenth to >/= 4+/5 to promote patellar stability with walking / standing.    Time 6    Period Weeks    Status On-going    Target Date 01/15/21      PT LONG TERM GOAL #2   Title pt to be able to walk/ stand for >/= 45 min and ascend/ descen >/=12 steps with </= 2/10 max pain in the knee for functional endurance required for ADLs and community ambulation    Time 6    Period Weeks    Status On-going    Target Date 01/15/21      PT LONG TERM GOAL #3   Title increase FOTO score to >/= 56% to demo improvement in function    Time 6    Period Weeks    Status Unable to assess    Target Date 01/15/21      PT LONG TERM GOAL #4   Title improve spare pain sore to </= 60 to demo improvement in pain control and awareness.    Time 6    Period Weeks    Status On-going    Target Date 01/15/21      PT LONG TERM GOAL #5   Title pt to be IND with advanced HEP to be able to maintain and progress current LOF IND    Time 6    Period Weeks    Status On-going    Target Date 01/15/21      Additional Long Term Goals   Additional Long Term Goals Yes      PT LONG TERM GOAL #6   Title increase bil gross shoulder strength to >/= 4+/5 to assist with funcitonal lifting    Time 6    Period Weeks    Status New    Target Date 01/15/21      PT LONG TERM GOAL #7   Title reduce L upper trap and surrounding muscle tension to reduce pain in the L shoulder and reduce report of potential locking of the neck    Time 6    Period Weeks    Status New    Target Date 01/15/21                   Plan - 12/04/20 1039     Clinical Impression Statement Jaime Boone presents to treatment with a new script to inclue her L shoulder, low back, and R posterior hip pain. She has functional trunk, shoulder and hip ROM. MMT revealed gross weakness in bil UE with pain during  testing with pain during assessing with the LUE. She would benefit from physical therapy to promote gross UE/LE strengthening, core activation, posture education to reduce pain and maximize her function by addressing the deficits listed.    PT Frequency 2x / week  PT Duration 6 weeks    PT Treatment/Interventions ADLs/Self Care Home Management;Cryotherapy;Electrical Stimulation;Iontophoresis 37m/ml Dexamethasone;Moist Heat;Ultrasound;Gait training;Stair training;Therapeutic activities;Therapeutic exercise;Balance training;Neuromuscular re-education;Manual techniques;Patient/family education;Passive range of motion;Dry needling;Taping    PT Next Visit Plan review/update HEP PRN, trialed McConnel taping? gross hip/ knee strengthning, gait/ stair training. PNE, ptential SIJ involvement in R low back hamstring stretching, hip flexor MET, scapular setting and shoudler strengthing, discuss DN vs MTPR    [place]- quad set with ball/pillow squeeze, bridge with adductor squeeze, SLR with ER, and sidelying hip abduction. upper trap stretch, leator scapulae stretching, scapular retraction, hamstring stretch (supine/ seated)    Consulted and Agree with Plan of Care Patient             Patient will benefit from skilled therapeutic intervention in order to improve the following deficits and impairments:  Improper body mechanics, Increased muscle spasms, Decreased strength, Postural dysfunction, Pain, Decreased activity tolerance, Decreased endurance, Decreased range of motion, Abnormal gait  Visit Diagnosis: Chronic pain of left knee - Plan: PT plan of care cert/re-cert  Chronic pain of right knee - Plan: PT plan of care cert/re-cert  Muscle weakness (generalized) - Plan: PT plan of care cert/re-cert  Chronic bilateral low back pain, unspecified whether sciatica present - Plan: PT plan of care cert/re-cert  Chronic left shoulder pain - Plan: PT plan of care  cert/re-cert     Problem List Patient Active Problem List   Diagnosis Date Noted   Pain due to onychomycosis of toenail of right foot 07/14/2019   Acute hepatitis 10/12/2016   Hypertension 10/12/2016   Sleep apnea 10/12/2016   Type 2 diabetes mellitus ([place] 10/12/2016   Menopause 03/16/2016   Keratosis 04/17/2015   Vaginal lesion 03/14/2015   Sebaceous cyst of labia 04/06/2012   History of hepatitis C 04/06/2012   Constipation 04/06/2012   Postmenopausal 03/01/2012   Vaginal atrophy 03/01/2012   KStarr LakePT, DPT, LAT, ATC  12/04/20  12:09 PM      [facility] [address]Phone: [phone]  Fax:  [phone] Name: Jaime LISSMRN: [id number] of Birth: [birth year]

## 2020-12-06 ENCOUNTER — Encounter: Payer: Self-pay | Admitting: Physical Therapy

## 2020-12-06 ENCOUNTER — Other Ambulatory Visit: Payer: Self-pay

## 2020-12-06 ENCOUNTER — Ambulatory Visit: Payer: Medicare Other | Admitting: Physical Therapy

## 2020-12-06 DIAGNOSIS — M545 Low back pain, unspecified: Secondary | ICD-10-CM

## 2020-12-06 DIAGNOSIS — M25562 Pain in left knee: Secondary | ICD-10-CM | POA: Diagnosis not present

## 2020-12-06 DIAGNOSIS — M6281 Muscle weakness (generalized): Secondary | ICD-10-CM

## 2020-12-06 DIAGNOSIS — M25512 Pain in left shoulder: Secondary | ICD-10-CM

## 2020-12-06 DIAGNOSIS — G8929 Other chronic pain: Secondary | ICD-10-CM

## 2020-12-06 DIAGNOSIS — M25561 Pain in right knee: Secondary | ICD-10-CM

## 2020-12-06 NOTE — Therapy (Signed)
[place], [place], [address] Phone: [phone]   Fax:  [phone]  Physical Therapy Treatment  Patient Details  Name: Jaime Boone MRN: [id number] Date of Birth: [birth year] Referring Provider (PT): Geoffery Lyons, NP   Encounter Date: 12/06/2020   PT End of Session - 12/06/20 0928     Visit Number 5    Number of Visits 16    Date for PT Re-Evaluation 01/15/21    Authorization Type MCR: kx mod at 15th visit, FOTO 6th and 10th    Progress Note Due on Visit 10    PT Start Time 0928    PT Stop Time 1010    PT Time Calculation (min) 42 min    Activity Tolerance Patient tolerated treatment well    Behavior During Therapy [facility] for tasks assessed/performed             Past Medical History:  Diagnosis Date   Acid reflux    Arthritis    Asthma    Cirrhosis ([place])    Connective tissue disease ([place])    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   Degenerative joint disease    Diabetes mellitus without complication ([place])    Hepatitis C    Hypertension    Sleep apnea     Past Surgical History:  Procedure Laterality Date   ABDOMINAL HYSTERECTOMY     TAH   APPENDECTOMY     DILATION AND CURETTAGE OF UTERUS     EYE SURGERY Left    CATARACT   FOOT SURGERY Bilateral    BUNIONECTOMY   GALLBLADDER SURGERY     keiloid removal      ear   NOSE SURGERY     oral stone extraction      There were no vitals filed for this visit.   Subjective Assessment - 12/06/20 0929     Subjective "I've been working on some of the exercise that was given. I was trying to find the R hip pain yesterday and it was better."    Patient Stated Goals to decrease pain    Currently in Pain? Yes    Pain Score 4     Pain Location Shoulder    Pain Orientation Left    Multiple Pain Sites Yes    Pain Location Back    Pain Orientation Lower    Pain Descriptors / Indicators Aching;Tightness    Pain Type Chronic pain    Pain Onset  More than a month ago    Pain Frequency Intermittent    Pain Score 0    Pain Orientation Right                [facility] PT Assessment - 12/06/20 0001       Assessment   Medical Diagnosis Unspecified osteoarthritis, unspecified site, L upper trap, low back pain, and R buttock pain.    Referring Provider (PT) Geoffery Lyons, NP                           [facility] Adult PT Treatment/Exercise - 12/06/20 0001       Neck Exercises: Stretches   Upper Trapezius Stretch 2 reps;Left;30 seconds    Levator Stretch Left;2 reps;30 seconds      Lumbar Exercises: Aerobic   Nustep L5 x x 5 min UE/LE      Lumbar Exercises: Seated   Other Seated Lumbar Exercises seated ab set while sitting on  dyna disc 1 x 10 holding each 3 sec      Knee/Hip Exercises: Seated   Sit to Sand 2 sets;10 reps;without UE support   with hi lo table elevated, cues for rocking foward to help with mobility, 2nd set holding 10# kettlebell     Shoulder Exercises: Seated   Row Strengthening;Both;12 reps;Theraband    Theraband Level (Shoulder Row) Level 2 (Red)    Other Seated Exercises double ER with scapular retraction 2 x 10 with RTB   seated on dyna disc     Manual Therapy   Manual Therapy Soft tissue mobilization    Manual therapy comments MTPR along the L upper trap and levator scapulae stretch 1 x 30 sec ea.   taught how to use of theracane   Soft tissue mobilization IASTM along the L upper trap/ levator scapulae                     PT Education - 12/06/20 0936     Education Details reviewed self trigger point release and benefit of using tools.    Person(s) Educated Patient    Methods Explanation;Verbal cues    Comprehension Verbalized understanding;Verbal cues required              PT Short Term Goals - 12/04/20 1005       PT SHORT TERM GOAL #1   Title pt to be IND with initial HEP    Period Weeks    Status On-going      PT SHORT TERM GOAL #2   Title pt to verbalize/  demo efficient posture and lifting mechanics to reduce and prevent knee pain    Period Weeks    Status On-going               PT Long Term Goals - 12/04/20 1019       PT LONG TERM GOAL #1   Title increase gross hip / knee strenth to >/= 4+/5 to promote patellar stability with walking / standing.    Time 6    Period Weeks    Status On-going    Target Date 01/15/21      PT LONG TERM GOAL #2   Title pt to be able to walk/ stand for >/= 45 min and ascend/ descen >/=12 steps with </= 2/10 max pain in the knee for functional endurance required for ADLs and community ambulation    Time 6    Period Weeks    Status On-going    Target Date 01/15/21      PT LONG TERM GOAL #3   Title increase FOTO score to >/= 56% to demo improvement in function    Time 6    Period Weeks    Status Unable to assess    Target Date 01/15/21      PT LONG TERM GOAL #4   Title improve spare pain sore to </= 60 to demo improvement in pain control and awareness.    Time 6    Period Weeks    Status On-going    Target Date 01/15/21      PT LONG TERM GOAL #5   Title pt to be IND with advanced HEP to be able to maintain and progress current LOF IND    Time 6    Period Weeks    Status On-going    Target Date 01/15/21      Additional Long Term Goals   Additional Long Term Goals Yes  PT LONG TERM GOAL #6   Title increase bil gross shoulder strength to >/= 4+/5 to assist with funcitonal lifting    Time 6    Period Weeks    Status New    Target Date 01/15/21      PT LONG TERM GOAL #7   Title reduce L upper trap and surrounding muscle tension to reduce pain in the L shoulder and reduce report of potential locking of the neck    Time 6    Period Weeks    Status New    Target Date 01/15/21                   Plan - 12/06/20 1003     Clinical Impression Statement pt arrives to pt reporting compliance with her HEP and additionally is feeling better in her hipo and back to day. the Knee  continues to aggrivate but she hass been working on it. focused on STW for the Lupper trap and reviewed how to perform self trigger point release techniques. continued working hip/ knee strength with sit to stand utilizing weight to assist with bimoechanics from elevated table. continued work on shoulder strengthening while seated on an unstable surface to and core strengthening. She did well with exercise today and noted feeling alittle better compared to when she came in.             Patient will benefit from skilled therapeutic intervention in order to improve the following deficits and impairments:  Improper body mechanics, Increased muscle spasms, Decreased strength, Postural dysfunction, Pain, Decreased activity tolerance, Decreased endurance, Decreased range of motion, Abnormal gait  Visit Diagnosis: Chronic pain of left knee  Chronic pain of right knee  Muscle weakness (generalized)  Chronic bilateral low back pain, unspecified whether sciatica present  Chronic left shoulder pain     Problem List Patient Active Problem List   Diagnosis Date Noted   Pain due to onychomycosis of toenail of right foot 07/14/2019   Acute hepatitis 10/12/2016   Hypertension 10/12/2016   Sleep apnea 10/12/2016   Type 2 diabetes mellitus ([place]) 10/12/2016   Menopause 03/16/2016   Keratosis 04/17/2015   Vaginal lesion 03/14/2015   Sebaceous cyst of labia 04/06/2012   History of hepatitis C 04/06/2012   Constipation 04/06/2012   Postmenopausal 03/01/2012   Vaginal atrophy 03/01/2012    Starr Lake PT, DPT, LAT, ATC  12/06/20  10:11 AM      [facility] [address] [place], [place], [address] Phone: [phone]   Fax:  [phone]  Name: Jaime Boone MRN: [id number] Date of Birth: March 31, 1940

## 2020-12-09 ENCOUNTER — Ambulatory Visit: Payer: Medicare Other | Admitting: Physical Therapy

## 2020-12-10 DIAGNOSIS — K7400 Hepatic fibrosis, unspecified: Secondary | ICD-10-CM | POA: Insufficient documentation

## 2020-12-10 DIAGNOSIS — K589 Irritable bowel syndrome without diarrhea: Secondary | ICD-10-CM | POA: Insufficient documentation

## 2020-12-10 DIAGNOSIS — M1990 Unspecified osteoarthritis, unspecified site: Secondary | ICD-10-CM | POA: Insufficient documentation

## 2020-12-10 DIAGNOSIS — K760 Fatty (change of) liver, not elsewhere classified: Secondary | ICD-10-CM | POA: Insufficient documentation

## 2020-12-11 ENCOUNTER — Encounter: Payer: Self-pay | Admitting: Physical Therapy

## 2020-12-11 ENCOUNTER — Other Ambulatory Visit: Payer: Self-pay

## 2020-12-11 ENCOUNTER — Ambulatory Visit: Payer: Medicare Other | Admitting: Physical Therapy

## 2020-12-11 DIAGNOSIS — M6281 Muscle weakness (generalized): Secondary | ICD-10-CM

## 2020-12-11 DIAGNOSIS — M25512 Pain in left shoulder: Secondary | ICD-10-CM

## 2020-12-11 DIAGNOSIS — G8929 Other chronic pain: Secondary | ICD-10-CM

## 2020-12-11 DIAGNOSIS — M25562 Pain in left knee: Secondary | ICD-10-CM

## 2020-12-11 DIAGNOSIS — M545 Low back pain, unspecified: Secondary | ICD-10-CM

## 2020-12-11 NOTE — Therapy (Signed)
[place], [place], [address] Phone: [phone]   Fax:  [phone]  Physical Therapy Treatment  Patient Details  Name: Jaime Boone MRN: [id number] Date of Birth: 07-28-1940 Referring Provider (PT): Geoffery Lyons, NP   Encounter Date: 12/11/2020   PT End of Session - 12/11/20 0849     Visit Number 6    Number of Visits 16    Date for PT Re-Evaluation 01/15/21    Authorization Type MCR: kx mod at 15th visit, FOTO 6th and 10th    Progress Note Due on Visit 10    PT Start Time 0848    PT Stop Time 0930    PT Time Calculation (min) 42 min             Past Medical History:  Diagnosis Date   Acid reflux    Arthritis    Asthma    Cirrhosis ([place])    Connective tissue disease ([place])    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   Degenerative joint disease    Diabetes mellitus without complication ([place])    Hepatitis C    Hypertension    Sleep apnea     Past Surgical History:  Procedure Laterality Date   ABDOMINAL HYSTERECTOMY     TAH   APPENDECTOMY     DILATION AND CURETTAGE OF UTERUS     EYE SURGERY Left    CATARACT   FOOT SURGERY Bilateral    BUNIONECTOMY   GALLBLADDER SURGERY     keiloid removal      ear   NOSE SURGERY     oral stone extraction      There were no vitals filed for this visit.   Subjective Assessment - 12/11/20 0850     Subjective I think I will consider the dry needling on my back and hip. The shoulder and neck are much better.    Currently in Pain? Yes    Pain Score 0-No pain    Pain Location Shoulder    Pain Orientation Left    Pain Score 6    Pain Location Back    Pain Orientation Right;Lower;Mid    Pain Descriptors / Indicators Aching;Tightness    Pain Type Chronic pain    Aggravating Factors  first waking up, wakes up at night, prolonged sitting or standing    Pain Relieving Factors sitting,  resting                                [facility] Adult PT Treatment/Exercise - 12/11/20 0001       Neck Exercises: Supine   Neck Retraction 10 reps;5 secs      Lumbar Exercises: Stretches   Single Knee to Chest Stretch 3 reps;30 seconds    Lower Trunk Rotation 10 seconds    Lower Trunk Rotation Limitations x 10 reps    Pelvic Tilt 10 reps;5 seconds    Figure 4 Stretch 3 reps;30 seconds    Figure 4 Stretch Limitations push and pull      Lumbar Exercises: Aerobic   Nustep L5 x  5 min UE/LE      Lumbar Exercises: Supine   Clam 15 reps    Clam Limitations green      Shoulder Exercises: Seated   Other Seated Exercises double ER with scapular retraction 2 x 10 with RTB  PT Short Term Goals - 12/04/20 1005       PT SHORT TERM GOAL #1   Title pt to be IND with initial HEP    Period Weeks    Status On-going      PT SHORT TERM GOAL #2   Title pt to verbalize/ demo efficient posture and lifting mechanics to reduce and prevent knee pain    Period Weeks    Status On-going               PT Long Term Goals - 12/04/20 1019       PT LONG TERM GOAL #1   Title increase gross hip / knee strenth to >/= 4+/5 to promote patellar stability with walking / standing.    Time 6    Period Weeks    Status On-going    Target Date 01/15/21      PT LONG TERM GOAL #2   Title pt to be able to walk/ stand for >/= 45 min and ascend/ descen >/=12 steps with </= 2/10 max pain in the knee for functional endurance required for ADLs and community ambulation    Time 6    Period Weeks    Status On-going    Target Date 01/15/21      PT LONG TERM GOAL #3   Title increase FOTO score to >/= 56% to demo improvement in function    Time 6    Period Weeks    Status Unable to assess    Target Date 01/15/21      PT LONG TERM GOAL #4   Title improve spare pain sore to </= 60 to demo improvement in pain control and awareness.    Time 6    Period Weeks    Status On-going    Target Date 01/15/21      PT  LONG TERM GOAL #5   Title pt to be IND with advanced HEP to be able to maintain and progress current LOF IND    Time 6    Period Weeks    Status On-going    Target Date 01/15/21      Additional Long Term Goals   Additional Long Term Goals Yes      PT LONG TERM GOAL #6   Title increase bil gross shoulder strength to >/= 4+/5 to assist with funcitonal lifting    Time 6    Period Weeks    Status New    Target Date 01/15/21      PT LONG TERM GOAL #7   Title reduce L upper trap and surrounding muscle tension to reduce pain in the L shoulder and reduce report of potential locking of the neck    Time 6    Period Weeks    Status New    Target Date 01/15/21                   Plan - 12/11/20 0908     Clinical Impression Statement Pt arrives reporting back and right hip were terrible this morning and yesterday. She reports knees are stiff and neck and shoulder are not painful today. Focused lumbopelvic and hip mobility today. She tolerated all therex with min discomfort relieved with modifications. Reviewed postural strengthening exercises without increased pain. She reported no pain at end of session today.    PT Next Visit Plan FOTO status? review/update HEP PRN, trialed McConnel taping? gross hip/ knee strengthning, gait/ stair training. PNE, ptential SIJ involvement in R  low back hamstring stretching, hip flexor MET, scapular setting and shoudler strengthing, discuss DN vs MTPR- Pt reports she is open to [facility]    PT [place] - quad set with ball/pillow squeeze, bridge with adductor squeeze, SLR with ER, and sidelying hip abduction. upper trap stretch, leator scapulae stretching, scapular retraction, hamstring stretch (supine/ seated)             Patient will benefit from skilled therapeutic intervention in order to improve the following deficits and impairments:  Improper body mechanics, Increased muscle spasms, Decreased strength, Postural dysfunction, Pain,  Decreased activity tolerance, Decreased endurance, Decreased range of motion, Abnormal gait  Visit Diagnosis: Chronic pain of left knee  Chronic pain of right knee  Muscle weakness (generalized)  Chronic bilateral low back pain, unspecified whether sciatica present  Chronic left shoulder pain     Problem List Patient Active Problem List   Diagnosis Date Noted   Pain due to onychomycosis of toenail of right foot 07/14/2019   Acute hepatitis 10/12/2016   Hypertension 10/12/2016   Sleep apnea 10/12/2016   Type 2 diabetes mellitus ([place]) 10/12/2016   Menopause 03/16/2016   Keratosis 04/17/2015   Vaginal lesion 03/14/2015   Sebaceous cyst of labia 04/06/2012   History of hepatitis C 04/06/2012   Constipation 04/06/2012   Postmenopausal 03/01/2012   Vaginal atrophy 03/01/2012    Dorene Ar, PTA 12/11/2020, 9:25 AM  [place] [facility] [address] [place], [place], [address] Phone: [phone]   Fax:  [phone]  Name: Jaime Boone MRN: [id number] Date of Birth: 07/08/1940

## 2020-12-16 ENCOUNTER — Other Ambulatory Visit: Payer: Self-pay

## 2020-12-16 ENCOUNTER — Ambulatory Visit: Payer: Medicare Other | Admitting: Physical Therapy

## 2020-12-16 DIAGNOSIS — M25512 Pain in left shoulder: Secondary | ICD-10-CM

## 2020-12-16 DIAGNOSIS — M6281 Muscle weakness (generalized): Secondary | ICD-10-CM

## 2020-12-16 DIAGNOSIS — G8929 Other chronic pain: Secondary | ICD-10-CM

## 2020-12-16 DIAGNOSIS — M25562 Pain in left knee: Secondary | ICD-10-CM

## 2020-12-16 DIAGNOSIS — M25561 Pain in right knee: Secondary | ICD-10-CM

## 2020-12-16 DIAGNOSIS — M545 Low back pain, unspecified: Secondary | ICD-10-CM

## 2020-12-16 NOTE — Therapy (Signed)
[place], [place], [address] Phone: [phone]   Fax:  [phone]  Physical Therapy Treatment  Patient Details  Name: Jaime Boone MRN: [id number] Date of Birth: 1940/08/17 Referring Provider (PT): Geoffery Lyons, NP   Encounter Date: 12/16/2020   PT End of Session - 12/16/20 0933     Visit Number 7    Number of Visits 16    Date for PT Re-Evaluation 01/15/21    Authorization Type MCR: kx mod at 15th visit,  10th    Progress Note Due on Visit 10    PT Start Time 0932    PT Stop Time 1013    PT Time Calculation (min) 41 min    Activity Tolerance Patient tolerated treatment well    Behavior During Therapy [facility] for tasks assessed/performed             Past Medical History:  Diagnosis Date   Acid reflux    Arthritis    Asthma    Cirrhosis ([place])    Connective tissue disease ([place])    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   Degenerative joint disease    Diabetes mellitus without complication ([place])    Hepatitis C    Hypertension    Sleep apnea     Past Surgical History:  Procedure Laterality Date   ABDOMINAL HYSTERECTOMY     TAH   APPENDECTOMY     DILATION AND CURETTAGE OF UTERUS     EYE SURGERY Left    CATARACT   FOOT SURGERY Bilateral    BUNIONECTOMY   GALLBLADDER SURGERY     keiloid removal      ear   NOSE SURGERY     oral stone extraction      There were no vitals filed for this visit.   Subjective Assessment - 12/16/20 0935     Subjective " I am doing better today, but I had take a bath in voltaren last night putting it on my knee, hip, shoulders"    Currently in Pain? Yes    Pain Score 0-No pain    Pain Orientation Left    Aggravating Factors  moving around too much    Pain Relieving Factors voltaren    Pain Score 4    Pain Location Back    Pain Orientation Right;Lower    Pain Descriptors / Indicators Aching;Tightness    Aggravating Factors  first thing in the morning,  prolonged sitting    Pain Relieving Factors voltaren    Pain Score 0    Pain Location Knee    Pain Orientation Left    Pain Descriptors / Indicators Aching    Pain Type Chronic pain    Pain Onset More than a month ago    Pain Frequency Intermittent    Aggravating Factors  prolonged standing/ walking    Pain Relieving Factors voltaren                     [facility] Adult PT Treatment/Exercise:  Therapeutic Exercise: Lower trunk rotation 1 x 20  Seated Neck retraction x 10 hold 5 sec R glute med stretch in L Sidelying 2 x 30 sec L Sidelying: R  clamshell 2 x 15 with GTB Pt fatigues quickly taking intermittent breaks throughout exercise L Sidelying: R reverse clamshell 2 x 10   Manual Therapy: MTPR along the R glute med/ min x 3, piriformis x 2 DTM along glute / piriformis McConnel taping of the  L knee  Neuromuscular re-ed: NA  Therapeutic Activity: NA  Self Care: NA  ITEMS NOT PERFORMED TODAY: Therapeutic Exercise: Pelvic tilt x 10 hold 5 sec   Manual Therapy: STW for L shoulder  Neuromuscular re-ed:   Therapeutic Activity:                     PT Education - 12/16/20 0939     Education Details REviewed FOTO assessment, Reviewed HEp and updated today for R glute med stretch    Person(s) Educated Patient    Methods Explanation;Verbal cues    Comprehension Verbalized understanding;Verbal cues required              PT Short Term Goals - 12/04/20 1005       PT SHORT TERM GOAL #1   Title pt to be IND with initial HEP    Period Weeks    Status On-going      PT SHORT TERM GOAL #2   Title pt to verbalize/ demo efficient posture and lifting mechanics to reduce and prevent knee pain    Period Weeks    Status On-going               PT Long Term Goals - 12/04/20 1019       PT LONG TERM GOAL #1   Title increase gross hip / knee strenth to >/= 4+/5 to promote patellar stability with walking / standing.    Time 6     Period Weeks    Status On-going    Target Date 01/15/21      PT LONG TERM GOAL #2   Title pt to be able to walk/ stand for >/= 45 min and ascend/ descen >/=12 steps with </= 2/10 max pain in the knee for functional endurance required for ADLs and community ambulation    Time 6    Period Weeks    Status On-going    Target Date 01/15/21      PT LONG TERM GOAL #3   Title increase FOTO score to >/= 56% to demo improvement in function    Time 6    Period Weeks    Status Unable to assess    Target Date 01/15/21      PT LONG TERM GOAL #4   Title improve spare pain sore to </= 60 to demo improvement in pain control and awareness.    Time 6    Period Weeks    Status On-going    Target Date 01/15/21      PT LONG TERM GOAL #5   Title pt to be IND with advanced HEP to be able to maintain and progress current LOF IND    Time 6    Period Weeks    Status On-going    Target Date 01/15/21      Additional Long Term Goals   Additional Long Term Goals Yes      PT LONG TERM GOAL #6   Title increase bil gross shoulder strength to >/= 4+/5 to assist with funcitonal lifting    Time 6    Period Weeks    Status New    Target Date 01/15/21      PT LONG TERM GOAL #7   Title reduce L upper trap and surrounding muscle tension to reduce pain in the L shoulder and reduce report of potential locking of the neck    Time 6    Period Weeks    Status New  Target Date 01/15/21                   Plan - 12/16/20 1001     Clinical Impression Statement pt arrives reporitng decreased pain today but notes she is feeling better due to excessive use of volatren gel last evening.  pt opted to hold off on TPDN today for the hip so utilized more manual techniques. Focused mostly on the R hip and low back today. Did trial McConnel taping of the L knee to help with patellar alignment. End of session she noted some redcution in soreness in the hip but still noted continued pain. p    PT  Treatment/Interventions ADLs/Self Care Home Management;Cryotherapy;Electrical Stimulation;Iontophoresis 4mg /ml Dexamethasone;Moist Heat;Ultrasound;Gait training;Stair training;Therapeutic activities;Therapeutic exercise;Balance training;Neuromuscular re-education;Manual techniques;Patient/family education;Passive range of motion;Dry needling;Taping    PT Next Visit Plan FOTO status? review/update HEP PRN, response McConnel taping? gross hip/ knee strengthning, gait/ stair training. PNE, ptential SIJ involvement in R low back hamstring stretching, hip flexor MET, scapular setting and shoudler strengthing, discuss DN vs MTPR- Pt reports she is open to [facility]    PT [place] - quad set with ball/pillow squeeze, bridge with adductor squeeze, SLR with ER, and sidelying hip abduction. upper trap stretch, leator scapulae stretching, scapular retraction, hamstring stretch (supine/ seated)             Patient will benefit from skilled therapeutic intervention in order to improve the following deficits and impairments:  Improper body mechanics, Increased muscle spasms, Decreased strength, Postural dysfunction, Pain, Decreased activity tolerance, Decreased endurance, Decreased range of motion, Abnormal gait  Visit Diagnosis: Chronic pain of left knee  Chronic pain of right knee  Muscle weakness (generalized)  Chronic bilateral low back pain, unspecified whether sciatica present  Chronic left shoulder pain     Problem List Patient Active Problem List   Diagnosis Date Noted   Pain due to onychomycosis of toenail of right foot 07/14/2019   Acute hepatitis 10/12/2016   Hypertension 10/12/2016   Sleep apnea 10/12/2016   Type 2 diabetes mellitus ([place]) 10/12/2016   Menopause 03/16/2016   Keratosis 04/17/2015   Vaginal lesion 03/14/2015   Sebaceous cyst of labia 04/06/2012   History of hepatitis C 04/06/2012   Constipation 04/06/2012   Postmenopausal 03/01/2012   Vaginal atrophy  03/01/2012    Starr Lake PT, DPT, LAT, ATC  12/16/20  10:19 AM     [place] [facility] [address] [place], [place], [address] Phone: [phone]   Fax:  [phone]  Name: TIAUNNA BUFORD MRN: [id number] Date of Birth: 21-Oct-1940

## 2020-12-19 ENCOUNTER — Ambulatory Visit: Payer: Medicare Other | Admitting: Physical Therapy

## 2020-12-19 ENCOUNTER — Other Ambulatory Visit: Payer: Self-pay

## 2020-12-19 ENCOUNTER — Encounter: Payer: Self-pay | Admitting: Physical Therapy

## 2020-12-19 DIAGNOSIS — G8929 Other chronic pain: Secondary | ICD-10-CM

## 2020-12-19 DIAGNOSIS — M6281 Muscle weakness (generalized): Secondary | ICD-10-CM

## 2020-12-19 DIAGNOSIS — M545 Low back pain, unspecified: Secondary | ICD-10-CM

## 2020-12-19 DIAGNOSIS — M25562 Pain in left knee: Secondary | ICD-10-CM | POA: Diagnosis not present

## 2020-12-19 NOTE — Therapy (Signed)
[place], [place], [address] Phone: [phone]   Fax:  [phone]  Physical Therapy Treatment  Patient Details  Name: Jaime Boone MRN: [id number] Date of Birth: 12/01/1940 Referring Provider (PT): Geoffery Lyons, NP   Encounter Date: 12/19/2020   PT End of Session - 12/19/20 0919     Visit Number 8    Number of Visits 16    Date for PT Re-Evaluation 01/15/21    Authorization Type MCR: kx mod at 15th visit,  10th    Progress Note Due on Visit 10    PT Start Time 0919    PT Stop Time 1003    PT Time Calculation (min) 44 min    Activity Tolerance Patient tolerated treatment well    Behavior During Therapy [facility] for tasks assessed/performed             Past Medical History:  Diagnosis Date   Acid reflux    Arthritis    Asthma    Cirrhosis ([place])    Connective tissue disease ([place])    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   Degenerative joint disease    Diabetes mellitus without complication ([place])    Hepatitis C    Hypertension    Sleep apnea     Past Surgical History:  Procedure Laterality Date   ABDOMINAL HYSTERECTOMY     TAH   APPENDECTOMY     DILATION AND CURETTAGE OF UTERUS     EYE SURGERY Left    CATARACT   FOOT SURGERY Bilateral    BUNIONECTOMY   GALLBLADDER SURGERY     keiloid removal      ear   NOSE SURGERY     oral stone extraction      There were no vitals filed for this visit.   Subjective Assessment - 12/19/20 0920     Subjective "I am feeling pretty sore, and I woke up last night with cramping in the R leg which I had to rub and press on. the tape on the knee really helped."    Patient Stated Goals to decrease pain    Currently in Pain? Yes    Pain Score 0-No pain    Pain Orientation Left    Pain Type Chronic pain    Pain Score 4    Pain Location Back    Pain Orientation Right    Pain Descriptors / Indicators Aching;Sore    Pain Onset More than a month ago     Pain Frequency Intermittent    Pain Score 0    Pain Location Knee    Pain Orientation Left    Pain Descriptors / Indicators Tightness    Pain Type Chronic pain    Pain Onset More than a month ago    Pain Frequency Intermittent                [facility] PT Assessment - 12/19/20 0001       Assessment   Medical Diagnosis Unspecified osteoarthritis, unspecified site, L upper trap, low back pain, and R buttock pain.    Referring Provider (PT) Geoffery Lyons, NP                   [facility] Adult PT Treatment/Exercise:  Therapeutic Exercise: Supine clamshell 2 x 20 with GTB Bridge with ball squeeze 2 x 10  SAQ set with ball squeeze (for VMO activation) 2 x 15   Manual Therapy: Skilled palpation and monitoring of  pt during TPDN IASTM along the R glute med/ min L knee McConnel taping Low load long duration medial patellar glide   Neuromuscular re-ed: N/A  Therapeutic Activity: N/A  Self Care: N/A  ITEMS NOT PERFORMED TODAY:      Trigger Point Dry Needling - 12/19/20 0001     Consent Given? Yes    Education Handout Provided Yes    Muscles Treated Back/Hip Gluteus medius;Gluteus minimus    Electrical Stimulation Performed with Dry Needling Yes    E-stim with Dry Needling Details CPS LVL 20 x 8 min adjusting PRN    Gluteus Minimus Response Twitch response elicited;Palpable increased muscle length   R only   Gluteus Medius Response Twitch response elicited;Palpable increased muscle length   R only                    PT Short Term Goals - 12/04/20 1005       PT SHORT TERM GOAL #1   Title pt to be IND with initial HEP    Period Weeks    Status On-going      PT SHORT TERM GOAL #2   Title pt to verbalize/ demo efficient posture and lifting mechanics to reduce and prevent knee pain    Period Weeks    Status On-going               PT Long Term Goals - 12/04/20 1019       PT LONG TERM GOAL #1   Title increase gross hip / knee strenth to  >/= 4+/5 to promote patellar stability with walking / standing.    Time 6    Period Weeks    Status On-going    Target Date 01/15/21      PT LONG TERM GOAL #2   Title pt to be able to walk/ stand for >/= 45 min and ascend/ descen >/=12 steps with </= 2/10 max pain in the knee for functional endurance required for ADLs and community ambulation    Time 6    Period Weeks    Status On-going    Target Date 01/15/21      PT LONG TERM GOAL #3   Title increase FOTO score to >/= 56% to demo improvement in function    Time 6    Period Weeks    Status Unable to assess    Target Date 01/15/21      PT LONG TERM GOAL #4   Title improve spare pain sore to </= 60 to demo improvement in pain control and awareness.    Time 6    Period Weeks    Status On-going    Target Date 01/15/21      PT LONG TERM GOAL #5   Title pt to be IND with advanced HEP to be able to maintain and progress current LOF IND    Time 6    Period Weeks    Status On-going    Target Date 01/15/21      Additional Long Term Goals   Additional Long Term Goals Yes      PT LONG TERM GOAL #6   Title increase bil gross shoulder strength to >/= 4+/5 to assist with funcitonal lifting    Time 6    Period Weeks    Status New    Target Date 01/15/21      PT LONG TERM GOAL #7   Title reduce L upper trap and surrounding muscle tension to reduce  pain in the L shoulder and reduce report of potential locking of the neck    Time 6    Period Weeks    Status New    Target Date 01/15/21                   Plan - 12/19/20 4132     Clinical Impression Statement Jaime Boone reported improvement in her shoulder but conitnued R hip/ quad soreness. educated and consent was given for TPDN focusing on the R glute med combined with Estim and followed with IASTM techniques. Continued McConnel taping for the L knee due to pt reported improvement in pain, and strengthening for the hips/ knees to maixmize patellar stability.              Patient will benefit from skilled therapeutic intervention in order to improve the following deficits and impairments:  Improper body mechanics, Increased muscle spasms, Decreased strength, Postural dysfunction, Pain, Decreased activity tolerance, Decreased endurance, Decreased range of motion, Abnormal gait  Visit Diagnosis: Chronic pain of left knee  Chronic pain of right knee  Muscle weakness (generalized)  Chronic bilateral low back pain, unspecified whether sciatica present  Chronic left shoulder pain     Problem List Patient Active Problem List   Diagnosis Date Noted   Pain due to onychomycosis of toenail of right foot 07/14/2019   Acute hepatitis 10/12/2016   Hypertension 10/12/2016   Sleep apnea 10/12/2016   Type 2 diabetes mellitus ([place]) 10/12/2016   Menopause 03/16/2016   Keratosis 04/17/2015   Vaginal lesion 03/14/2015   Sebaceous cyst of labia 04/06/2012   History of hepatitis C 04/06/2012   Constipation 04/06/2012   Postmenopausal 03/01/2012   Vaginal atrophy 03/01/2012   Starr Lake PT, DPT, LAT, ATC  12/19/20  10:00 AM      [facility] [address] [place], [place], [address] Phone: [phone]   Fax:  [phone]  Name: Jaime Boone MRN: [id number] Date of Birth: 08/07/1940

## 2020-12-23 ENCOUNTER — Encounter: Payer: Self-pay | Admitting: Physical Therapy

## 2020-12-23 ENCOUNTER — Other Ambulatory Visit: Payer: Self-pay

## 2020-12-23 ENCOUNTER — Ambulatory Visit: Payer: Medicare Other | Admitting: Physical Therapy

## 2020-12-23 DIAGNOSIS — M25512 Pain in left shoulder: Secondary | ICD-10-CM

## 2020-12-23 DIAGNOSIS — G8929 Other chronic pain: Secondary | ICD-10-CM

## 2020-12-23 DIAGNOSIS — M25562 Pain in left knee: Secondary | ICD-10-CM

## 2020-12-23 DIAGNOSIS — M6281 Muscle weakness (generalized): Secondary | ICD-10-CM

## 2020-12-23 DIAGNOSIS — M545 Low back pain, unspecified: Secondary | ICD-10-CM

## 2020-12-23 NOTE — Therapy (Signed)
[place], [place], [address] Phone: [phone]   Fax:  [phone]  Physical Therapy Treatment  Patient Details  Name: Jaime Boone MRN: [id number] Date of Birth: 1940-12-06 Referring Provider (PT): Geoffery Lyons, NP   Encounter Date: 12/23/2020   PT End of Session - 12/23/20 0831     Visit Number 9    Number of Visits 16    Date for PT Re-Evaluation 01/15/21    Authorization Type MCR: kx mod at 15th visit,  10th    PT Start Time 0800    PT Stop Time 0843    PT Time Calculation (min) 43 min             Past Medical History:  Diagnosis Date   Acid reflux    Arthritis    Asthma    Cirrhosis ([place])    Connective tissue disease ([place])    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   Degenerative joint disease    Diabetes mellitus without complication ([place])    Hepatitis C    Hypertension    Sleep apnea     Past Surgical History:  Procedure Laterality Date   ABDOMINAL HYSTERECTOMY     TAH   APPENDECTOMY     DILATION AND CURETTAGE OF UTERUS     EYE SURGERY Left    CATARACT   FOOT SURGERY Bilateral    BUNIONECTOMY   GALLBLADDER SURGERY     keiloid removal      ear   NOSE SURGERY     oral stone extraction      There were no vitals filed for this visit.   Subjective Assessment - 12/23/20 0804     Subjective The TPDN helped for the right hip. But the back pain is not going anywhere. I walked to [facility] after I left here last time. I tried to clea my bathroom and kitchen over the weekend and I had to keep stopping and laying down.    Currently in Pain? Yes    Pain Location Neck    Pain Orientation Left    Pain Descriptors / Indicators Spasm    Pain Type Chronic pain    Aggravating Factors  housework    Pain Relieving Factors stretches    Pain Score 0    Pain Location Back    Pain Orientation Mid;Right    Pain Descriptors / Indicators Aching;Sharp    Pain Type Chronic pain     Aggravating Factors  activity on feet, chores    Pain Relieving Factors voltaren, ;lay down    Pain Score 0    Pain Location Knee    Pain Orientation Left    Pain Descriptors / Indicators --   stiffness, just hurts   Pain Type Chronic pain    Aggravating Factors  sometimes sitting, not really with standing and walking    Pain Relieving Factors tape sometimes                               [facility] Adult PT Treatment/Exercise - 12/23/20 0001       Neck Exercises: Supine   Neck Retraction 10 reps;5 secs      Neck Exercises: Stretches   Upper Trapezius Stretch 2 reps;Left;30 seconds    Levator Stretch Left;2 reps;30 seconds      Lumbar Exercises: Aerobic   Nustep L5 UE/LE x 5 minutes      Lumbar  Exercises: Supine   Pelvic Tilt 10 reps    Pelvic Tilt Limitations 5 seconds    Clam 15 reps    Clam Limitations green    Bridge with clamshell 15 reps    Bridge with [employer] Limitations green, small rom    Straight Leg Raise 5 reps   2 sets   Straight Leg Raises Limitations with abdominal draw in      Knee/Hip Exercises: Standing   Lateral Step Up 5 reps;Step Height: 4";Hand Hold: 2    Lateral Step Up Limitations with bil UE st counter    Forward Step Up 5 reps;Step Height: 4";Hand Hold: 2    Forward Step Up Limitations facing counter with bilat UE      Knee/Hip Exercises: Seated   Sit to Sand 5 reps;with UE support   from foam on mat table     Shoulder Exercises: Seated   Other Seated Exercises double ER with scapular retraction 2 x 10 with RTB                       PT Short Term Goals - 12/04/20 1005       PT SHORT TERM GOAL #1   Title pt to be IND with initial HEP    Period Weeks    Status On-going      PT SHORT TERM GOAL #2   Title pt to verbalize/ demo efficient posture and lifting mechanics to reduce and prevent knee pain    Period Weeks    Status On-going               PT Long Term Goals - 12/04/20 1019       PT  LONG TERM GOAL #1   Title increase gross hip / knee strenth to >/= 4+/5 to promote patellar stability with walking / standing.    Time 6    Period Weeks    Status On-going    Target Date 01/15/21      PT LONG TERM GOAL #2   Title pt to be able to walk/ stand for >/= 45 min and ascend/ descen >/=12 steps with </= 2/10 max pain in the knee for functional endurance required for ADLs and community ambulation    Time 6    Period Weeks    Status On-going    Target Date 01/15/21      PT LONG TERM GOAL #3   Title increase FOTO score to >/= 56% to demo improvement in function    Time 6    Period Weeks    Status Unable to assess    Target Date 01/15/21      PT LONG TERM GOAL #4   Title improve spare pain sore to </= 60 to demo improvement in pain control and awareness.    Time 6    Period Weeks    Status On-going    Target Date 01/15/21      PT LONG TERM GOAL #5   Title pt to be IND with advanced HEP to be able to maintain and progress current LOF IND    Time 6    Period Weeks    Status On-going    Target Date 01/15/21      Additional Long Term Goals   Additional Long Term Goals Yes      PT LONG TERM GOAL #6   Title increase bil gross shoulder strength to >/= 4+/5 to assist with funcitonal lifting  Time 6    Period Weeks    Status New    Target Date 01/15/21      PT LONG TERM GOAL #7   Title reduce L upper trap and surrounding muscle tension to reduce pain in the L shoulder and reduce report of potential locking of the neck    Time 6    Period Weeks    Status New    Target Date 01/15/21                   Plan - 12/23/20 0910     Clinical Impression Statement [name] reports the TPDN really helped her hip pain.  Knee tape was not as effective as previous applications. She notes continued high levels of back pain limiting her standing and walking tolerance. She reports she is unable to ascens the stairs and can descend using UE assist. Began 4 inch step  exercises with pt requiring max UE support on counter and verbal cues to complete without compensations. Pt required increased time to complete 5 STS with UE assist and elevated mat table. Continued with core snd lumbar mobility therex which was tolerated well. Pt reported feeling okay at end of session.    PT Treatment/Interventions ADLs/Self Care Home Management;Cryotherapy;Electrical Stimulation;Iontophoresis 45m/ml Dexamethasone;Moist Heat;Ultrasound;Gait training;Stair training;Therapeutic activities;Therapeutic exercise;Balance training;Neuromuscular re-education;Manual techniques;Patient/family education;Passive range of motion;Dry needling;Taping    PT Next Visit Plan review/update HEP PRN, response McConnel taping? gross hip/ knee strengthning, gait/ stair training. PNE, back hamstring stretching, hip flexor MET, scapular setting and shoudler strengthing, discuss DN vs MTPR- [facility]   [facility] HD[facility]- quad set with ball/pillow squeeze, bridge with adductor squeeze, SLR with ER, and sidelying hip abduction. upper trap stretch, leator scapulae stretching, scapular retraction, hamstring stretch (supine/ seated)             Patient will benefit from skilled therapeutic intervention in order to improve the following deficits and impairments:  Improper body mechanics, Increased muscle spasms, Decreased strength, Postural dysfunction, Pain, Decreased activity tolerance, Decreased endurance, Decreased range of motion, Abnormal gait  Visit Diagnosis: Chronic pain of left knee  Chronic pain of right knee  Muscle weakness (generalized)  Chronic left shoulder pain  Chronic bilateral low back pain, unspecified whether sciatica present     Problem List Patient Active Problem List   Diagnosis Date Noted   Pain due to onychomycosis of toenail of right foot 07/14/2019   Acute hepatitis 10/12/2016   Hypertension 10/12/2016   Sleep apnea 10/12/2016    [place] 2 diabetes [place] 10/12/2016   Menopause 03/16/2016   Keratosis 04/17/2015   Vaginal lesion 03/14/2015   Sebaceous cyst of labia 04/06/2012   History of hepatitis C 04/06/2012   Constipation 04/06/2012   Postmenopausal 03/01/2012   Vaginal atrophy 03/01/2012    DDorene Ar PTA 12/23/2020, 9:18 AM  [facility] [address]Phone: [phone]  Fax:  [phone] Name: Jaime MONTEJANOMRN: [id number] of Birth: 9March 10, 1942

## 2020-12-25 ENCOUNTER — Other Ambulatory Visit: Payer: Self-pay

## 2020-12-25 ENCOUNTER — Ambulatory Visit: Payer: Medicare Other | Admitting: Physical Therapy

## 2020-12-25 DIAGNOSIS — M25562 Pain in left knee: Secondary | ICD-10-CM | POA: Diagnosis not present

## 2020-12-25 DIAGNOSIS — M545 Low back pain, unspecified: Secondary | ICD-10-CM

## 2020-12-25 DIAGNOSIS — G8929 Other chronic pain: Secondary | ICD-10-CM

## 2020-12-25 DIAGNOSIS — M6281 Muscle weakness (generalized): Secondary | ICD-10-CM

## 2020-12-25 DIAGNOSIS — M25561 Pain in right knee: Secondary | ICD-10-CM

## 2020-12-25 NOTE — Patient Instructions (Signed)

## 2020-12-25 NOTE — Therapy (Signed)
[place], [place], [address] Phone: [phone]   Fax:  [phone]  Physical Therapy Treatment Progress Note Reporting Period 11/20/2020 to 12/25/2020  See note below for Objective Data and Assessment of Progress/Goals.      Patient Details  Name: MAUD RUBENDALL MRN: [id number] Date of Birth: 1940-04-24 Referring Provider (PT): Geoffery Lyons, NP   Encounter Date: 12/25/2020   PT End of Session - 12/25/20 0934     Visit Number 10    Number of Visits 16    Date for PT Re-Evaluation 01/15/21    Authorization Type MCR: kx mod at 15th visit,  10th    Progress Note Due on Visit 82    PT Start Time 0932    PT Stop Time 2778    PT Time Calculation (min) 43 min             Past Medical History:  Diagnosis Date   Acid reflux    Arthritis    Asthma    Cirrhosis ([place])    Connective tissue disease ([place])    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   Degenerative joint disease    Diabetes mellitus without complication ([place])    Hepatitis C    Hypertension    Sleep apnea     Past Surgical History:  Procedure Laterality Date   ABDOMINAL HYSTERECTOMY     TAH   APPENDECTOMY     DILATION AND CURETTAGE OF UTERUS     EYE SURGERY Left    CATARACT   FOOT SURGERY Bilateral    BUNIONECTOMY   GALLBLADDER SURGERY     keiloid removal      ear   NOSE SURGERY     oral stone extraction      There were no vitals filed for this visit.   Subjective Assessment - 12/25/20 0934     Subjective "The hip is doing much better and the shoulder is doing much better too. The back and the spasm in my thigh is the thing that is giving me spasms. I am working on drinking more and the spasm don't seem to go away."    Currently in Pain? Yes    Pain Score 0-No pain    Aggravating Factors  housework    Pain Relieving Factors stretches and exercise    Effect of Pain on Daily Activities limited    Pain Score 0   at worst 7/10    Pain Location Back    Pain Descriptors / Indicators Aching;Sore    Pain Type Chronic pain    Pain Onset More than a month ago    Pain Frequency Intermittent    Aggravating Factors  unsure, sitting    Pain Relieving Factors voltaren,    Pain Score 0    Pain Location Knee    Pain Orientation Left    Pain Type Chronic pain    Pain Onset More than a month ago    Pain Frequency Intermittent    Aggravating Factors  some stiffness    Pain Relieving Factors tape, and moving around.                [facility] PT Assessment - 12/25/20 0001       Assessment   Medical Diagnosis Unspecified osteoarthritis, unspecified site, L upper trap, low back pain, and R buttock pain.    Referring Provider (PT) Geoffery Lyons, NP      Observation/Other Assessments   Focus on  Therapeutic Outcomes (FOTO)  43% limited spare 65.4      Strength   Right Shoulder Flexion 4/5    Right Shoulder Extension 4+/5    Right Shoulder ABduction 4/5    Right Shoulder Internal Rotation 4/5    Right Shoulder External Rotation 4/5    Left Shoulder Flexion 4/5    Left Shoulder Extension 4+/5    Left Shoulder ABduction 4/5    Left Shoulder Internal Rotation 4/5    Left Shoulder External Rotation 4/5    Right Hip Flexion 4/5    Right Hip Extension 4/5    Right Hip ABduction 4-/5    Right Hip ADduction 4/5    Left Hip Flexion 4/5    Left Hip Extension 4/5    Left Hip ABduction 4-/5    Left Hip ADduction 4/5    Right Knee Flexion 4+/5    Right Knee Extension 4+/5    Left Knee Flexion 4/5    Left Knee Extension 4/5                        [facility] Adult PT Treatment/Exercise:  Therapeutic Exercise: Nu-step L 5 x 5 min UE/LE Anterior pelvic tilt 2 x 10 holding 5 sec  Manual Therapy:  N/A  Neuromuscular re-ed: N/A  Therapeutic Activity: Stair training up/ down 6 inch step x 5 Verbal cues for proper form. Pt demonstrated apprehension secondary to report of knee pain which improved with  repetition.   Self Care: Seated posture / positioning using towel to promote anterior pelvic tilt, espec  ITEMS NOT PERFORMED TODAY:             PT Education - 12/25/20 1012     Education Details Reviewed FOTO assessment for 10th visit, and her improvement in function compared to her initial evaluation.    Person(s) Educated Patient    Methods Explanation;Verbal cues;Handout    Comprehension Verbalized understanding;Verbal cues required              PT Short Term Goals - 12/25/20 0943       PT SHORT TERM GOAL #1   Title pt to be IND with initial HEP    Status Achieved      PT SHORT TERM GOAL #2   Period Weeks    Status Achieved               PT Long Term Goals - 12/25/20 6606       PT LONG TERM GOAL #1   Title increase gross hip / knee strength to >/= 4+/5 to promote patellar stability with walking / standing.    Period Weeks    Status On-going    Target Date 01/15/21      PT LONG TERM GOAL #2   Title pt to be able to walk/ stand for >/= 45 min and ascend/ descen >/=12 steps with </= 2/10 max pain in the knee for functional endurance required for ADLs and community ambulation    Baseline able to walk 45 min but says she hasn't tried step    Status Partially Met    Target Date 01/15/21      PT LONG TERM GOAL #3   Title increase FOTO score to >/= 56% to demo improvement in function    Period Weeks    Status On-going    Target Date 01/15/21      PT LONG TERM GOAL #4   Title improve spare pain sore  to </= 60 to demo improvement in pain control and awareness.    Baseline 65.4 today    Period Weeks    Status On-going    Target Date 01/15/21      PT LONG TERM GOAL #5   Title pt to be IND with advanced HEP to be able to maintain and progress current LOF IND    Time 6    Period Weeks    Status On-going    Target Date 01/15/21      PT LONG TERM GOAL #6   Title increase bil gross shoulder strength to >/= 4+/5 to assist with funcitonal lifting     Status On-going    Target Date 01/15/21      PT LONG TERM GOAL #7   Title reduce L upper trap and surrounding muscle tension to reduce pain in the L shoulder and reduce report of potential locking of the neck    Period Weeks    Status Achieved    Target Date 01/15/21                   Plan - 12/25/20 1008     Clinical Impression Statement Mrs Alber is making good progress with physical therapy increasing her function with her shoulder, hip and knee. Worked on stair training which she initally was apprehensive but demonstarted improvement in function with step over pattern with use of railing. continued working on back via posture mostly with sitting. She is making good progress toward her goals today. she would benefit from continued physical therapy to improve posture, promote core strength and maximize her function by addressing the deficits at home.    PT Treatment/Interventions ADLs/Self Care Home Management;Cryotherapy;Electrical Stimulation;Iontophoresis 4mg /ml Dexamethasone;Moist Heat;Ultrasound;Gait training;Stair training;Therapeutic activities;Therapeutic exercise;Balance training;Neuromuscular re-education;Manual techniques;Patient/family education;Passive range of motion;Dry needling;Taping    PT Next Visit Plan review/update HEP PRN, continued McConnel taping PRN, core strengthenening gross hip/ knee strengthning, gait/ stair training. PNE, continue stair training.    PT Home Exercise Plan [id number] - quad set with ball/pillow squeeze, bridge with adductor squeeze, SLR with ER, and sidelying hip abduction. upper trap stretch, leator scapulae stretching, scapular retraction, hamstring stretch (supine/ seated)    Consulted and Agree with Plan of Care Patient             Patient will benefit from skilled therapeutic intervention in order to improve the following deficits and impairments:  Improper body mechanics, Increased muscle spasms, Decreased strength, Postural  dysfunction, Pain, Decreased activity tolerance, Decreased endurance, Decreased range of motion, Abnormal gait  Visit Diagnosis: Chronic pain of left knee  Chronic pain of right knee  Muscle weakness (generalized)  Chronic left shoulder pain  Chronic bilateral low back pain, unspecified whether sciatica present     Problem List Patient Active Problem List   Diagnosis Date Noted   Pain due to onychomycosis of toenail of right foot 07/14/2019   Acute hepatitis 10/12/2016   Hypertension 10/12/2016   Sleep apnea 10/12/2016   Type 2 diabetes mellitus ([place]) 10/12/2016   Menopause 03/16/2016   Keratosis 04/17/2015   Vaginal lesion 03/14/2015   Sebaceous cyst of labia 04/06/2012   History of hepatitis C 04/06/2012   Constipation 04/06/2012   Postmenopausal 03/01/2012   Vaginal atrophy 03/01/2012   Starr Lake PT, DPT, LAT, ATC  12/25/20  10:16 AM     [facility] [address] [place], [place], [address] Phone: [phone]   Fax:  [phone]  Name: Layton Tappan  [place] MRN: [id number] Date of Birth: 1940/07/21

## 2020-12-30 ENCOUNTER — Ambulatory Visit: Payer: Medicare Other | Admitting: Physical Therapy

## 2021-01-02 ENCOUNTER — Ambulatory Visit: Payer: Medicare Other | Attending: Internal Medicine | Admitting: Physical Therapy

## 2021-01-02 ENCOUNTER — Encounter: Payer: Self-pay | Admitting: Physical Therapy

## 2021-01-02 ENCOUNTER — Other Ambulatory Visit: Payer: Self-pay

## 2021-01-02 DIAGNOSIS — G8929 Other chronic pain: Secondary | ICD-10-CM | POA: Diagnosis present

## 2021-01-02 DIAGNOSIS — M25562 Pain in left knee: Secondary | ICD-10-CM | POA: Insufficient documentation

## 2021-01-02 DIAGNOSIS — M6281 Muscle weakness (generalized): Secondary | ICD-10-CM | POA: Diagnosis present

## 2021-01-02 DIAGNOSIS — M545 Low back pain, unspecified: Secondary | ICD-10-CM | POA: Diagnosis present

## 2021-01-02 DIAGNOSIS — M25561 Pain in right knee: Secondary | ICD-10-CM | POA: Insufficient documentation

## 2021-01-02 DIAGNOSIS — M25512 Pain in left shoulder: Secondary | ICD-10-CM | POA: Diagnosis present

## 2021-01-02 NOTE — Therapy (Signed)
[place], [place], [address] Phone: [phone]   Fax:  [phone]  Physical Therapy Treatment  Patient Details  Name: Jaime Boone MRN: [id number] Date of Birth: [birth year] Referring Provider (PT): Geoffery Lyons, NP   Encounter Date: 01/02/2021   PT End of Session - 01/02/21 0856     Visit Number 11    Number of Visits 16    Date for PT Re-Evaluation 01/15/21    Authorization Type MCR: kx mod at 15th visit,  10th    Progress Note Due on Visit 34    PT Start Time 0847    PT Stop Time 0930    PT Time Calculation (min) 43 min             Past Medical History:  Diagnosis Date   Acid reflux    Arthritis    Asthma    Cirrhosis ([place])    Connective tissue disease ([place])    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   Degenerative joint disease    Diabetes mellitus without complication ([place])    Hepatitis C    Hypertension    Sleep apnea     Past Surgical History:  Procedure Laterality Date   ABDOMINAL HYSTERECTOMY     TAH   APPENDECTOMY     DILATION AND CURETTAGE OF UTERUS     EYE SURGERY Left    CATARACT   FOOT SURGERY Bilateral    BUNIONECTOMY   GALLBLADDER SURGERY     keiloid removal      ear   NOSE SURGERY     oral stone extraction      There were no vitals filed for this visit.   Subjective Assessment - 01/02/21 0849     Subjective Neck is much better, hip is much better, back is still bothering me with prolonged activity. I climbed three flights of stairs. I was tired and back hurt so I rested but I did it.               [facility] Adult PT Treatment/Exercise - 12/23/20 0001                             Lumbar Exercises: Aerobic   Nustep L5 UE/LE x 5 minutes      Lumbar Exercises: Supine                   Bridge with clamshell 15 reps    Bridge with [employer] Limitations green, small rom    Straight Leg Raise 10 reps   1 sets   Straight Leg Raises Limitations  with abdominal draw in      Knee/Hip Exercises: Standing   Lateral Step Up 10 reps;Step Height: 4";Hand Hold: 2    Lateral Step Up Limitations with bil UE st counter    Forward Step Up 10 reps;Step Height: 4";Hand Hold: 2    Forward Step Up Limitations facing counter with bilat UE      Knee/Hip Exercises: Seated   Sit to Sand 10 reps;with UE support   from foam on mat table     Shoulder Exercises: Seated   Other Seated Exercises double ER with scapular retraction 2 x 10 with RTB (standing today)  Sidelying clam 10 x 2 each Standing ROW Green 10 x 2         PT Short Term Goals - 12/25/20 4967  PT SHORT TERM GOAL #1   Title pt to be IND with initial HEP    Status Achieved      PT SHORT TERM GOAL #2   Period Weeks    Status Achieved               PT Long Term Goals - 01/02/21 6629       PT LONG TERM GOAL #1   Title increase gross hip / knee strength to >/= 4+/5 to promote patellar stability with walking / standing.    Time 6    Period Weeks    Status On-going      PT LONG TERM GOAL #2   Title pt to be able to walk/ stand for >/= 45 min and ascend/ descen >/=12 steps with </= 2/10 max pain in the knee for functional endurance required for ADLs and community ambulation    Baseline able to walk 45 min and climbed 3 flights of stairs with min increased pain - mostly tiredness    Time 6    Period Weeks    Status Achieved      PT LONG TERM GOAL #3   Title increase FOTO score to >/= 56% to demo improvement in function    Time 6    Period Weeks    Status On-going      PT LONG TERM GOAL #4   Title improve spare pain sore to </= 60 to demo improvement in pain control and awareness.    Baseline 65.4    Time 6    Period Weeks    Status On-going      PT LONG TERM GOAL #5   Title pt to be IND with advanced HEP to be able to maintain and progress current LOF IND    Time 6    Status On-going      PT LONG TERM GOAL #6   Title increase bil gross shoulder  strength to >/= 4+/5 to assist with funcitonal lifting    Time 6    Period Weeks    Status On-going      PT LONG TERM GOAL #7   Title reduce L upper trap and surrounding muscle tension to reduce pain in the L shoulder and reduce report of potential locking of the neck    Time 6    Period Weeks    Status Achieved                   Plan - 01/02/21 1132     Clinical Impression Statement Jaime Boone notes improvement in stair negotiation with ability to climb 3 flights this week. She reports overall improvement in neck and hip however continued LBP that limits standing activity. She has met LTG# 2. Continued with UE and LE strengthening. She demonstrates improved sit-stand without as much UE assist.    PT Treatment/Interventions ADLs/Self Care Home Management;Cryotherapy;Electrical Stimulation;Iontophoresis 4mg /ml Dexamethasone;Moist Heat;Ultrasound;Gait training;Stair training;Therapeutic activities;Therapeutic exercise;Balance training;Neuromuscular re-education;Manual techniques;Patient/family education;Passive range of motion;Dry needling;Taping    PT Next Visit Plan review/update HEP PRN, continued [place]nel taping PRN, core strengthenening gross hip/ knee strengthning, gait/ stair training. PNE, continue stair training.    PT Home Exercise Plan [id number] - quad set with ball/pillow squeeze, bridge with adductor squeeze, SLR with ER, and sidelying hip abduction. upper trap stretch, leator scapulae stretching, scapular retraction, hamstring stretch (supine/ seated)             Patient will benefit from skilled therapeutic intervention in order to improve  the following deficits and impairments:  Improper body mechanics, Increased muscle spasms, Decreased strength, Postural dysfunction, Pain, Decreased activity tolerance, Decreased endurance, Decreased range of motion, Abnormal gait  Visit Diagnosis: Chronic pain of left knee  Muscle weakness (generalized)  Chronic bilateral low back  pain, unspecified whether sciatica present  Chronic left shoulder pain  Chronic pain of right knee     Problem List Patient Active Problem List   Diagnosis Date Noted   Pain due to onychomycosis of toenail of right foot 07/14/2019   Acute hepatitis 10/12/2016   Hypertension 10/12/2016   Sleep apnea 10/12/2016   Type 2 diabetes mellitus ([place]) 10/12/2016   Menopause 03/16/2016   Keratosis 04/17/2015   Vaginal lesion 03/14/2015   Sebaceous cyst of labia 04/06/2012   History of hepatitis C 04/06/2012   Constipation 04/06/2012   Postmenopausal 03/01/2012   Vaginal atrophy 03/01/2012    Dorene Ar, PTA 01/02/2021, 11:35 AM  [facility] [address] [place], [place], [address] Phone: [phone]   Fax:  [phone]  Name: Jaime Boone MRN: [id number] Date of Birth: 01/06/1941

## 2021-01-06 ENCOUNTER — Encounter: Payer: Self-pay | Admitting: Physical Therapy

## 2021-01-06 ENCOUNTER — Ambulatory Visit: Payer: Medicare Other | Admitting: Physical Therapy

## 2021-01-06 ENCOUNTER — Other Ambulatory Visit: Payer: Self-pay

## 2021-01-06 DIAGNOSIS — M25562 Pain in left knee: Secondary | ICD-10-CM | POA: Diagnosis not present

## 2021-01-06 DIAGNOSIS — M6281 Muscle weakness (generalized): Secondary | ICD-10-CM

## 2021-01-06 DIAGNOSIS — M25512 Pain in left shoulder: Secondary | ICD-10-CM

## 2021-01-06 DIAGNOSIS — G8929 Other chronic pain: Secondary | ICD-10-CM

## 2021-01-06 DIAGNOSIS — M545 Low back pain, unspecified: Secondary | ICD-10-CM

## 2021-01-06 NOTE — Therapy (Signed)
[place], [place], [address] Phone: [phone]   Fax:  [phone]  Physical Therapy Treatment  Patient Details  Name: Jaime Boone MRN: [id number] Date of Birth: Aug 14, 1940 Referring Provider (PT): Geoffery Lyons, NP   Encounter Date: 01/06/2021   PT End of Session - 01/06/21 0815     Visit Number 12    Number of Visits 16    Date for PT Re-Evaluation 01/15/21    Authorization Type MCR: kx mod at 15th visit,  10th    Progress Note Due on Visit 20    PT Start Time 0800    PT Stop Time 0843    PT Time Calculation (min) 43 min    Activity Tolerance Patient tolerated treatment well    Behavior During Therapy [facility] for tasks assessed/performed             Past Medical History:  Diagnosis Date   Acid reflux    Arthritis    Asthma    Cirrhosis ([place])    Connective tissue disease ([place])    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   Degenerative joint disease    Diabetes mellitus without complication ([place])    Hepatitis C    Hypertension    Sleep apnea     Past Surgical History:  Procedure Laterality Date   ABDOMINAL HYSTERECTOMY     TAH   APPENDECTOMY     DILATION AND CURETTAGE OF UTERUS     EYE SURGERY Left    CATARACT   FOOT SURGERY Bilateral    BUNIONECTOMY   GALLBLADDER SURGERY     keiloid removal      ear   NOSE SURGERY     oral stone extraction      There were no vitals filed for this visit.   Subjective Assessment - 01/06/21 0807     Subjective I had to pull heavy potted plants off the balcony and I was hurting alot after that friday and saturday. I think activity aggravates my pain and I just cannot do too much.    Currently in Pain? Yes    Pain Score 5    Pain Location Back    Pain Orientation Lower    Pain Descriptors / Indicators Aching;Sore    Pain Type Chronic pain    Aggravating Factors  increased activity, lifting plants    Pain Relieving Factors voltaren                 [facility] Adult PT Treatment/Exercise - 01/06/21 0001       Self-Care   Self-Care ADL's    ADL's Strategies discussed for moving plants: using towel to slide pots; use her grocerry wagon to move pots; split into smaller pots; take breaks      Neck Exercises: Stretches   Upper Trapezius Stretch 2 reps;Left;30 seconds    Levator Stretch Left;2 reps;30 seconds      Lumbar Exercises: Aerobic   Nustep L5 UE/LE x 5 minutes      Lumbar Exercises: Supine   Clam 15 reps    Clam Limitations green    Bridge with clamshell 15 reps    Bridge with [employer] Limitations green, small rom    Straight Leg Raise 5 reps   2 sets   Straight Leg Raises Limitations with abdominal draw in      Shoulder Exercises: Seated   Row 20 reps    Theraband Level (Shoulder Row) Level 3 (Green)  Other Seated Exercises double ER with scapular retraction 2 x 10 with RTB                       PT Short Term Goals - 12/25/20 0943       PT SHORT TERM GOAL #1   Title pt to be IND with initial HEP    Status Achieved      PT SHORT TERM GOAL #2   Period Weeks    Status Achieved               PT Long Term Goals - 01/02/21 6837       PT LONG TERM GOAL #1   Title increase gross hip / knee strength to >/= 4+/5 to promote patellar stability with walking / standing.    Time 6    Period Weeks    Status On-going      PT LONG TERM GOAL #2   Title pt to be able to walk/ stand for >/= 45 min and ascend/ descen >/=12 steps with </= 2/10 max pain in the knee for functional endurance required for ADLs and community ambulation    Baseline able to walk 45 min and climbed 3 flights of stairs with min increased pain - mostly tiredness    Time 6    Period Weeks    Status Achieved      PT LONG TERM GOAL #3   Title increase FOTO score to >/= 56% to demo improvement in function    Time 6    Period Weeks    Status On-going      PT LONG TERM GOAL #4   Title improve spare pain sore to </=  60 to demo improvement in pain control and awareness.    Baseline 65.4    Time 6    Period Weeks    Status On-going      PT LONG TERM GOAL #5   Title pt to be IND with advanced HEP to be able to maintain and progress current LOF IND    Time 6    Status On-going      PT LONG TERM GOAL #6   Title increase bil gross shoulder strength to >/= 4+/5 to assist with funcitonal lifting    Time 6    Period Weeks    Status On-going      PT LONG TERM GOAL #7   Title reduce L upper trap and surrounding muscle tension to reduce pain in the L shoulder and reduce report of potential locking of the neck    Time 6    Period Weeks    Status Achieved                   Plan - 01/06/21 0927     Clinical Impression Statement Kellyn reports increased pain over the weekend after moving multiple large potted plants indoors. Time spent reviewing options to reduce strain on her back like sliding plants using a towel or using her grocerry wagon. Pt continues with need of UE assist and elevated mat to rise from chair. She does endorse overall improvement in her condition however must use pacing strategies for pain management.    PT Treatment/Interventions ADLs/Self Care Home Management;Cryotherapy;Electrical Stimulation;Iontophoresis 4mg /ml Dexamethasone;Moist Heat;Ultrasound;Gait training;Stair training;Therapeutic activities;Therapeutic exercise;Balance training;Neuromuscular re-education;Manual techniques;Patient/family education;Passive range of motion;Dry needling;Taping    PT Next Visit Plan discharge next visit; check MMT/goals    PT [place] - quad set  with ball/pillow squeeze, bridge with adductor squeeze, SLR with ER, and sidelying hip abduction. upper trap stretch, leator scapulae stretching, scapular retraction, hamstring stretch (supine/ seated)    Consulted and Agree with Plan of Care Patient             Patient will benefit from skilled therapeutic intervention in  order to improve the following deficits and impairments:  Improper body mechanics, Increased muscle spasms, Decreased strength, Postural dysfunction, Pain, Decreased activity tolerance, Decreased endurance, Decreased range of motion, Abnormal gait  Visit Diagnosis: Chronic pain of left knee  Muscle weakness (generalized)  Chronic bilateral low back pain, unspecified whether sciatica present  Chronic left shoulder pain  Chronic pain of right knee     Problem List Patient Active Problem List   Diagnosis Date Noted   Pain due to onychomycosis of toenail of right foot 07/14/2019   Acute hepatitis 10/12/2016   Hypertension 10/12/2016   Sleep apnea 10/12/2016   Type 2 diabetes mellitus ([place]) 10/12/2016   Menopause 03/16/2016   Keratosis 04/17/2015   Vaginal lesion 03/14/2015   Sebaceous cyst of labia 04/06/2012   History of hepatitis C 04/06/2012   Constipation 04/06/2012   Postmenopausal 03/01/2012   Vaginal atrophy 03/01/2012    Jaime Boone, PTA 01/06/2021, 9:31 AM  [facility] [address] [place], [place], [address] Phone: [phone]   Fax:  [phone]  Name: Jaime Boone MRN: [id number] Date of Birth: [birth year]

## 2021-01-09 ENCOUNTER — Ambulatory Visit: Payer: Medicare Other | Admitting: Physical Therapy

## 2021-01-13 ENCOUNTER — Ambulatory Visit: Payer: Medicare Other | Admitting: Physical Therapy

## 2021-01-13 ENCOUNTER — Encounter: Payer: Self-pay | Admitting: Physical Therapy

## 2021-01-13 ENCOUNTER — Other Ambulatory Visit: Payer: Self-pay

## 2021-01-13 ENCOUNTER — Emergency Department (HOSPITAL_COMMUNITY)
Admission: EM | Admit: 2021-01-13 | Discharge: 2021-01-14 | Disposition: A | Discharge status: Home / Self Care | Payer: Medicare Other | Attending: Emergency Medicine | Admitting: Emergency Medicine

## 2021-01-13 ENCOUNTER — Emergency Department (HOSPITAL_COMMUNITY): Payer: Medicare Other

## 2021-01-13 DIAGNOSIS — Z87891 Personal history of nicotine dependence: Secondary | ICD-10-CM | POA: Insufficient documentation

## 2021-01-13 DIAGNOSIS — R7989 Other specified abnormal findings of blood chemistry: Secondary | ICD-10-CM | POA: Diagnosis not present

## 2021-01-13 DIAGNOSIS — R778 Other specified abnormalities of plasma proteins: Secondary | ICD-10-CM | POA: Insufficient documentation

## 2021-01-13 DIAGNOSIS — M545 Low back pain, unspecified: Secondary | ICD-10-CM

## 2021-01-13 DIAGNOSIS — I4891 Unspecified atrial fibrillation: Secondary | ICD-10-CM | POA: Diagnosis not present

## 2021-01-13 DIAGNOSIS — Z7984 Long term (current) use of oral hypoglycemic drugs: Secondary | ICD-10-CM | POA: Insufficient documentation

## 2021-01-13 DIAGNOSIS — R079 Chest pain, unspecified: Secondary | ICD-10-CM | POA: Diagnosis present

## 2021-01-13 DIAGNOSIS — Z20822 Contact with and (suspected) exposure to covid-19: Secondary | ICD-10-CM | POA: Diagnosis not present

## 2021-01-13 DIAGNOSIS — J45909 Unspecified asthma, uncomplicated: Secondary | ICD-10-CM | POA: Diagnosis not present

## 2021-01-13 DIAGNOSIS — G8929 Other chronic pain: Secondary | ICD-10-CM

## 2021-01-13 DIAGNOSIS — I10 Essential (primary) hypertension: Secondary | ICD-10-CM | POA: Diagnosis not present

## 2021-01-13 DIAGNOSIS — M6281 Muscle weakness (generalized): Secondary | ICD-10-CM

## 2021-01-13 DIAGNOSIS — Z79899 Other long term (current) drug therapy: Secondary | ICD-10-CM | POA: Insufficient documentation

## 2021-01-13 DIAGNOSIS — I480 Paroxysmal atrial fibrillation: Secondary | ICD-10-CM | POA: Diagnosis not present

## 2021-01-13 DIAGNOSIS — M25512 Pain in left shoulder: Secondary | ICD-10-CM

## 2021-01-13 DIAGNOSIS — E11649 Type 2 diabetes mellitus with hypoglycemia without coma: Secondary | ICD-10-CM | POA: Insufficient documentation

## 2021-01-13 DIAGNOSIS — M25562 Pain in left knee: Secondary | ICD-10-CM

## 2021-01-13 LAB — CBC WITH DIFFERENTIAL/PLATELET
Abs Immature Granulocytes: 0.03 10*3/uL (ref 0.00–0.07)
Basophils Absolute: 0.1 10*3/uL (ref 0.0–0.1)
Basophils Relative: 1 %
Eosinophils Absolute: 0.1 10*3/uL (ref 0.0–0.5)
Eosinophils Relative: 2 %
HCT: 36.3 % (ref 36.0–46.0)
Hemoglobin: 11 g/dL — ABNORMAL LOW (ref 12.0–15.0)
Immature Granulocytes: 0 %
Lymphocytes Relative: 16 %
Lymphs Abs: 1.5 10*3/uL (ref 0.7–4.0)
MCH: 20.5 pg — ABNORMAL LOW (ref 26.0–34.0)
MCHC: 30.3 g/dL (ref 30.0–36.0)
MCV: 67.6 fL — ABNORMAL LOW (ref 80.0–100.0)
Monocytes Absolute: 0.8 10*3/uL (ref 0.1–1.0)
Monocytes Relative: 8 %
Neutro Abs: 6.8 10*3/uL (ref 1.7–7.7)
Neutrophils Relative %: 73 %
Platelets: 248 10*3/uL (ref 150–400)
RBC: 5.37 MIL/uL — ABNORMAL HIGH (ref 3.87–5.11)
RDW: 15.6 % — ABNORMAL HIGH (ref 11.5–15.5)
WBC: 9.3 10*3/uL (ref 4.0–10.5)
nRBC: 0 % (ref 0.0–0.2)

## 2021-01-13 LAB — COMPREHENSIVE METABOLIC PANEL
ALT: 24 U/L (ref 0–44)
AST: 30 U/L (ref 15–41)
Albumin: 3.5 g/dL (ref 3.5–5.0)
Alkaline Phosphatase: 72 U/L (ref 38–126)
Anion gap: 12 (ref 5–15)
BUN: 5 mg/dL — ABNORMAL LOW (ref 8–23)
CO2: 22 mmol/L (ref 22–32)
Calcium: 9.2 mg/dL (ref 8.9–10.3)
Chloride: 103 mmol/L (ref 98–111)
Creatinine, Ser: 1.05 mg/dL — ABNORMAL HIGH (ref 0.44–1.00)
GFR, Estimated: 54 mL/min — ABNORMAL LOW (ref >60)
Glucose, Bld: 221 mg/dL — ABNORMAL HIGH (ref 70–99)
Potassium: 3.3 mmol/L — ABNORMAL LOW (ref 3.5–5.1)
Sodium: 137 mmol/L (ref 135–145)
Total Bilirubin: 0.5 mg/dL (ref 0.3–1.2)
Total Protein: 6.9 g/dL (ref 6.5–8.1)

## 2021-01-13 LAB — URINALYSIS, ROUTINE W REFLEX MICROSCOPIC
Bilirubin Urine: NEGATIVE
Glucose, UA: NEGATIVE mg/dL
Hgb urine dipstick: NEGATIVE
Ketones, ur: NEGATIVE mg/dL
Leukocytes,Ua: NEGATIVE
Nitrite: NEGATIVE
Protein, ur: NEGATIVE mg/dL
Specific Gravity, Urine: 1.003 — ABNORMAL LOW (ref 1.005–1.030)
pH: 6 (ref 5.0–8.0)

## 2021-01-13 LAB — MAGNESIUM: Magnesium: 1.6 mg/dL — ABNORMAL LOW (ref 1.7–2.4)

## 2021-01-13 LAB — CBG MONITORING, ED: Glucose-Capillary: 176 mg/dL — ABNORMAL HIGH (ref 70–99)

## 2021-01-13 LAB — TROPONIN I (HIGH SENSITIVITY)
Troponin I (High Sensitivity): 44 ng/L — ABNORMAL HIGH (ref <18)
Troponin I (High Sensitivity): 69 ng/L — ABNORMAL HIGH (ref <18)

## 2021-01-13 LAB — RESP PANEL BY RT-PCR (FLU A&B, COVID) ARPGX2
Influenza A by PCR: NEGATIVE
Influenza B by PCR: NEGATIVE
SARS Coronavirus 2 by RT PCR: NEGATIVE

## 2021-01-13 LAB — PROTIME-INR
INR: 1 (ref 0.8–1.2)
Prothrombin Time: 12.9 seconds (ref 11.4–15.2)

## 2021-01-13 LAB — TSH: TSH: 4.922 u[IU]/mL — ABNORMAL HIGH (ref 0.350–4.500)

## 2021-01-13 MED ORDER — DILTIAZEM HCL-DEXTROSE 125-5 MG/125ML-% IV SOLN (PREMIX)
5.0000 mg/h | INTRAVENOUS | Status: DC
  Administered 2021-01-13: 5 mg/h via INTRAVENOUS
  Filled 2021-01-13: qty 125

## 2021-01-13 MED ORDER — SODIUM CHLORIDE 0.9 % IV BOLUS
500.0000 mL | Freq: Once | INTRAVENOUS | Status: AC
  Administered 2021-01-13: 500 mL via INTRAVENOUS

## 2021-01-13 MED ORDER — APIXABAN 5 MG PO TABS
5.0000 mg | ORAL_TABLET | Freq: Two times a day (BID) | ORAL | Status: DC
  Administered 2021-01-14: 5 mg via ORAL
  Filled 2021-01-13: qty 1

## 2021-01-13 MED ORDER — APIXABAN 5 MG PO TABS: 5.0000 mg | ORAL_TABLET | Freq: Two times a day (BID) | ORAL | 0 refills | Status: AC

## 2021-01-13 MED ORDER — DILTIAZEM HCL ER 60 MG PO CP12: 60.0000 mg | ORAL_CAPSULE | Freq: Every day | ORAL | Status: DC

## 2021-01-13 MED ORDER — MAGNESIUM OXIDE -MG SUPPLEMENT 400 (240 MG) MG PO TABS
400.0000 mg | ORAL_TABLET | Freq: Once | ORAL | Status: AC
  Administered 2021-01-14: 400 mg via ORAL
  Filled 2021-01-13: qty 1

## 2021-01-13 MED ORDER — POTASSIUM CHLORIDE CRYS ER 20 MEQ PO TBCR
40.0000 meq | EXTENDED_RELEASE_TABLET | Freq: Once | ORAL | Status: AC
  Administered 2021-01-14: 40 meq via ORAL
  Filled 2021-01-13: qty 2

## 2021-01-13 NOTE — Therapy (Addendum)
[place], [place], [address] Phone: [phone]   Fax:  [phone]  Physical Therapy Treatment / Discharge  Patient Details  Name: Jaime Boone MRN: [id number] Date of Birth: Oct 05, 1940 Referring Provider (PT): Geoffery Lyons, NP   Encounter Date: 01/13/2021   PT End of Session - 01/13/21 0806     Visit Number 13    Number of Visits 16    Date for PT Re-Evaluation 01/15/21    Authorization Type MCR: kx mod at 15th visit,  10th    Progress Note Due on Visit 103    PT Start Time 0800    PT Stop Time 0830    PT Time Calculation (min) 30 min             Past Medical History:  Diagnosis Date   Acid reflux    Arthritis    Asthma    Cirrhosis ([place])    Connective tissue disease ([place])    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   Degenerative joint disease    Diabetes mellitus without complication ([place])    Hepatitis C    Hypertension    Sleep apnea     Past Surgical History:  Procedure Laterality Date   ABDOMINAL HYSTERECTOMY     TAH   APPENDECTOMY     DILATION AND CURETTAGE OF UTERUS     EYE SURGERY Left    CATARACT   FOOT SURGERY Bilateral    BUNIONECTOMY   GALLBLADDER SURGERY     keiloid removal      ear   NOSE SURGERY     oral stone extraction      There were no vitals filed for this visit.   Subjective Assessment - 01/13/21 0802     Subjective I have the exercises, the muscles creams and my meds that I can use which are helpful. I have not had much spasms in neck and side.    Currently in Pain? Yes    Pain Score 0-No pain    Pain Location Neck    Pain Score 4    Pain Location Back    Pain Orientation Lower;Right    Pain Descriptors / Indicators Discomfort    Pain Type Chronic pain    Pain Radiating Towards right hip    Aggravating Factors  increased activity, household chores    Pain Relieving Factors voltaren, stretches    Pain Score 0   just stiffness   Pain Location  Knee    Pain Orientation Right;Left    Pain Descriptors / Indicators --   stiffness               [facility] PT Assessment - 01/13/21 0001       Observation/Other Assessments   Focus on Therapeutic Outcomes (FOTO)  52% function ; SPARE 62.4      Strength   Right Shoulder Flexion 4/5    Right Shoulder Extension 4+/5    Right Shoulder ABduction 4/5    Right Shoulder Internal Rotation 4+/5    Right Shoulder External Rotation 4+/5    Left Shoulder Flexion 4/5    Left Shoulder Extension 4+/5    Left Shoulder ABduction 4/5    Left Shoulder Internal Rotation 4+/5    Left Shoulder External Rotation 4+/5    Right Hip Flexion 4+/5    Right Hip ABduction 4-/5    Left Hip Flexion 4+/5    Left Hip ABduction 4-/5  [facility] Adult PT Treatment/Exercise - 01/13/21 0001       Neck Exercises: Seated   Neck Retraction 10 reps      Neck Exercises: Stretches   Upper Trapezius Stretch 2 reps;Left;30 seconds    Levator Stretch Left;2 reps;30 seconds      Knee/Hip Exercises: Stretches   Active Hamstring Stretch 2 reps;30 seconds      Knee/Hip Exercises: Supine   Bridges 15 reps    Straight Leg Raises 10 reps      Knee/Hip Exercises: Sidelying   Hip ABduction 10 reps                       PT Short Term Goals - 01/13/21 0815       PT SHORT TERM GOAL #1   Title pt to be IND with initial HEP    Time 3    Period Weeks    Status Achieved    Target Date 12/11/20      PT SHORT TERM GOAL #2   Title pt to verbalize/ demo efficient posture and lifting mechanics to reduce and prevent knee pain    Time 3    Period Weeks    Status Achieved    Target Date 01/01/21               PT Long Term Goals - 01/13/21 0839       PT LONG TERM GOAL #1   Title increase gross hip / knee strength to >/= 4+/5 to promote patellar stability with walking / standing.    Baseline grosly 4+/5 with the exception of gluteals 4-/5    Time 6     Period Weeks    Status Partially Met      PT LONG TERM GOAL #2   Title pt to be able to walk/ stand for >/= 45 min and ascend/ descen >/=12 steps with </= 2/10 max pain in the knee for functional endurance required for ADLs and community ambulation    Baseline able to walk 45 min and climbed 3 flights of stairs with min increased pain - mostly tiredness    Time 6    Period Weeks    Status Achieved      PT LONG TERM GOAL #3   Title increase FOTO score to >/= 56% to demo improvement in function    Baseline improved from 31 to 52% which was more than predicted    Time 6    Period Weeks    Status Achieved      PT LONG TERM GOAL #4   Title improve spare pain sore to </= 60 to demo improvement in pain control and awareness.    Baseline improved from 70 to 62.4    Time 6    Period Weeks    Status Partially Met      PT LONG TERM GOAL #5   Title pt to be IND with advanced HEP to be able to maintain and progress current LOF IND    Time 6    Period Weeks    Status Achieved      PT LONG TERM GOAL #6   Title increase bil gross shoulder strength to >/= 4+/5 to assist with funcitonal lifting    Baseline 4/5 bil shoulder flex and abduction , otherwise grossly 4+/5. bilat shoulders    Time 6    Period Weeks    Status Partially Met      PT LONG TERM GOAL #7  Title reduce L upper trap and surrounding muscle tension to reduce pain in the L shoulder and reduce report of potential locking of the neck    Baseline no locking episodes recently    Time 6    Period Weeks    Status Achieved                   Plan - 01/13/21 0907     Clinical Impression Statement Kaiyah reports 60-70% overall improvemet thus far. Her shoulder and knee strength have improved. Her tolerance to ADLS and stair climbing has improved. Her FOTO score improve to beyond predicted and her Fear Avoidance score improved. She has significantly less episodes of spasm and cramp in her neck and side.  She is pleased with  her current level of function and is agreeable to discharge today. She has met or partially met all LTGS.    PT Treatment/Interventions ADLs/Self Care Home Management;Cryotherapy;Electrical Stimulation;Iontophoresis 4mg /ml Dexamethasone;Moist Heat;Ultrasound;Gait training;Stair training;Therapeutic activities;Therapeutic exercise;Balance training;Neuromuscular re-education;Manual techniques;Patient/family education;Passive range of motion;Dry needling;Taping    PT Next Visit Plan discharge today    PT [place] - quad set with ball/pillow squeeze, bridge with adductor squeeze, SLR with ER, and sidelying hip abduction. upper trap stretch, leator scapulae stretching, scapular retraction, hamstring stretch (supine/ seated)             Patient will benefit from skilled therapeutic intervention in order to improve the following deficits and impairments:  Improper body mechanics, Increased muscle spasms, Decreased strength, Postural dysfunction, Pain, Decreased activity tolerance, Decreased endurance, Decreased range of motion, Abnormal gait  Visit Diagnosis: Chronic pain of left knee  Muscle weakness (generalized)  Chronic left shoulder pain  Chronic bilateral low back pain, unspecified whether sciatica present  Chronic pain of right knee     Problem List Patient Active Problem List   Diagnosis Date Noted   Pain due to onychomycosis of toenail of right foot 07/14/2019   Acute hepatitis 10/12/2016   Hypertension 10/12/2016   Sleep apnea 10/12/2016   Type 2 diabetes mellitus ([place]) 10/12/2016   Menopause 03/16/2016   Keratosis 04/17/2015   Vaginal lesion 03/14/2015   Sebaceous cyst of labia 04/06/2012   History of hepatitis C 04/06/2012   Constipation 04/06/2012   Postmenopausal 03/01/2012   Vaginal atrophy 03/01/2012    Dorene Ar, PTA 01/13/2021, 9:45 AM  [facility] [address] [place], [place], [address] Phone: [phone]   Fax:  [phone]  Name: TIFFAY PINETTE MRN: [id number] Date of Birth: 1940-07-11       PHYSICAL THERAPY DISCHARGE SUMMARY  Visits from Start of Care: 13  Current functional level related to goals / functional outcomes: See goals FOTO 52%   Remaining deficits: See assessment    Education / Equipment: HEP, theraband, posture, gait/ stair training   Patient agrees to discharge. Patient goals were partially met. Patient is being discharged due to being pleased with the current functional level.     Kristoffer Leamon PT, DPT, LAT, ATC  01/19/21  8:43 PM

## 2021-01-13 NOTE — ED Notes (Signed)
Pt given ice water per request. No acute changes noted. Will continue to monitor.

## 2021-01-13 NOTE — ED Notes (Signed)
Noted pt HR has converted to NSR. Cardizem infusing. MD notified for further orders. Will continue to monitor.

## 2021-01-13 NOTE — ED Provider Notes (Signed)
[facility] EMERGENCY DEPARTMENT Provider Note   CSN: [id number] Arrival date & time: 01/13/21  1832     History Chief Complaint  Patient presents with   Atrial Fibrillation   Hypoglycemia    Jaime Boone is a 80 y.o. female.  The history is provided by the patient and medical records. No language interpreter was used.  Atrial Fibrillation This is a new problem. Episode onset: unclear. The problem has not changed since onset.Associated symptoms include chest pain. Pertinent negatives include no abdominal pain, no headaches and no shortness of breath. Nothing aggravates the symptoms. Nothing relieves the symptoms. She has tried nothing for the symptoms. The treatment provided no relief.  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) 10/12/2016   Menopause 03/16/2016   Keratosis 04/17/2015   Vaginal lesion 03/14/2015   Sebaceous cyst of labia 04/06/2012   History of hepatitis C 04/06/2012   Constipation 04/06/2012   Postmenopausal 03/01/2012    Vaginal atrophy 03/01/2012    Past Surgical History:  Procedure Laterality Date   ABDOMINAL HYSTERECTOMY     TAH   APPENDECTOMY     DILATION AND CURETTAGE OF UTERUS     EYE SURGERY Left    CATARACT   FOOT SURGERY Bilateral    BUNIONECTOMY   GALLBLADDER SURGERY     keiloid removal      ear   NOSE SURGERY     oral stone extraction       OB History     Gravida  6   Para  3   Term  3   Preterm      AB  3   Living  3      SAB  3   IAB      Ectopic      Multiple      Live Births  3           Family History  Problem Relation Age of Onset   Hypertension Mother    Diabetes Mother    Hypertension Father    Diabetes Father    Heart disease Father    Prostate cancer Father    Diabetes Sister    Heart disease Brother    Prostate cancer Brother    Lung cancer Cousin    Colon cancer Neg Hx    Stomach cancer Neg Hx    Esophageal cancer Neg Hx    Pancreatic cancer Neg Hx    Liver disease Neg  Hx     Social History   Tobacco Use   Smoking status: Former    Types: Cigarettes    Quit date: 03/01/1974    Years since quitting: 46.9   Smokeless tobacco: Never  Vaping Use   Vaping Use: Never used  Substance Use Topics   Alcohol use: Yes    Alcohol/week: 0.0 standard drinks    Comment: rare   Drug use: No    Home Medications Prior to Admission medications   Medication Sig Start Date End Date Taking? Authorizing Provider  Ascorbic Acid (VITAMIN C PO) Take by mouth.    [provider]  B-D ULTRAFINE III SHORT PEN 31G X 8 MM MISC SMARTSIG:2 Needle SUB-Q Daily 06/09/19   [provider]  Calcium Carbonate-Vitamin D 600-400 MG-UNIT tablet Take 1 tablet by mouth daily.    [provider]  cetirizine (ZYRTEC) 10 MG tablet Take 10 mg by mouth daily after breakfast.    [provider]  cyclobenzaprine (FLEXERIL) 5 MG tablet Take 5 mg by mouth at bedtime as needed. 04/25/20   [provider]  fluticasone  furoate-vilanterol (BREO ELLIPTA) 100-25 MCG/INH AEPB 1 puff by mouth daily 07/23/17   [provider]  gabapentin (NEURONTIN) 300 MG capsule Take 1 capsule (300 mg total) by mouth at bedtime. 06/02/18   Hyatt, Max T, DPM  glipiZIDE (GLUCOTROL) 5 MG tablet TAKE 2 TABLETS BY MOUTH EVERY MORNING AND 1 TABLET EVERY EVENING 06/27/19   [provider]  Lancets (ONETOUCH DELICA PLUS [id number]) MISC U TO TEST BS BID 05/15/18   [provider]  LANTUS SOLOSTAR 100 UNIT/ML Solostar Pen SMARTSIG:35 Unit(s) SUB-Q Daily 02/27/19   [provider]  levothyroxine (SYNTHROID, LEVOTHROID) 50 MCG tablet Take 50 mcg by mouth daily before breakfast.  04/27/13   [provider]  losartan (COZAAR) 50 MG tablet Take 25 mg by mouth 2 (two) times daily. 06/26/19   [provider]  meclizine (ANTIVERT) 25 MG tablet Take 1 tablet (25 mg total) by mouth 3 (three) times daily as needed for dizziness. 09/06/20   Andrew Au, MD  methocarbamol (ROBAXIN) 500 MG tablet Take 1 tablet (500 mg total) by mouth 2 (two) times daily. 01/26/17   Maczis, Barth Kirks, PA-C  montelukast (SINGULAIR) 10 MG tablet Take 10 mg by mouth daily after breakfast.     [provider]  Multiple Vitamin (MULTIVITAMIN) capsule Take 1 capsule by mouth daily.    [provider]  Multiple Vitamins-Minerals (DAILY MULTIVITAMIN) CAPS one capsule PO daily 07/14/16   [provider]  ONE TOUCH ULTRA TEST test strip  04/03/13   [provider]  pantoprazole (PROTONIX) 40 MG tablet Take 40 mg by mouth 2 (two) times daily.     [provider]  PROAIR HFA 108 (90 BASE) MCG/ACT inhaler Inhale 2 puffs into the lungs every 6 (six) hours as needed for wheezing or shortness of breath.  04/19/13   [provider]  triamcinolone cream (KENALOG) 0.1 % Apply 1 application topically daily as needed (skin irritation).  09/18/13   [provider]    Allergies    Aspirin,  Ciprofloxacin, Codeine, Nitrofuran derivatives, and Penicillins  Review of Systems   Review of Systems  Constitutional:  Positive for diaphoresis and fatigue. Negative for chills and fever.  Eyes:  Negative for visual disturbance.  Respiratory:  Negative for cough, chest tightness, shortness of breath and wheezing.   Cardiovascular:  Positive for chest pain and  palpitations (occasionally).  Gastrointestinal:  Negative for abdominal pain, constipation, diarrhea, nausea and vomiting.  Genitourinary:  Negative for flank pain.  Musculoskeletal:  Negative for back pain and neck pain.  Neurological:  Positive for tremors and light-headedness. Negative for syncope, numbness and headaches.  Psychiatric/Behavioral:  Negative for agitation.   All other systems reviewed and are negative.  Physical Exam Updated Vital Signs BP 117/73 (BP Location: Right Arm)   Pulse 99   Temp 98.5 F (36.9 C) (Oral)   Resp 14   Ht 5\' 3"  (1.6 m)   Wt 99.8 kg   SpO2 100%   BMI 38.97 kg/m   Physical Exam Vitals and nursing note reviewed.  Constitutional:      General: She is not in acute distress.    Appearance: She is well-developed. She is not ill-appearing, toxic-appearing or diaphoretic.  HENT:     Head: Normocephalic and atraumatic.     Nose: Nose normal.     Mouth/Throat:     Mouth: Mucous membranes are moist.     Pharynx: No oropharyngeal exudate or posterior oropharyngeal erythema.  Eyes:     Conjunctiva/sclera: Conjunctivae normal.     Pupils: Pupils are equal, round, and reactive to light.  Cardiovascular:     Rate and Rhythm: Tachycardia present. Rhythm irregular.     Pulses: Normal pulses.     Heart sounds: No murmur heard. Pulmonary:     Effort: Pulmonary effort is normal. No respiratory distress.     Breath sounds: Normal breath sounds. No wheezing, rhonchi or rales.  Chest:     Chest wall: No tenderness.  Abdominal:     General: Abdomen is flat.     Palpations: Abdomen is soft.      Tenderness: There is no abdominal tenderness. There is no guarding or rebound.  Musculoskeletal:        General: No tenderness.     Cervical back: Neck supple. No tenderness.  Skin:    General: Skin is warm and dry.     Capillary Refill: Capillary refill takes less than 2 seconds.     Findings: No erythema.  Neurological:     General: No focal deficit present.     Mental Status: She is alert.  Psychiatric:        Mood and Affect: Mood normal.    ED Results / Procedures / Treatments   Labs (all labs ordered are listed, but only abnormal results are displayed) Labs Reviewed  CBC WITH DIFFERENTIAL/PLATELET - Abnormal; Notable for the following components:      Result Value   RBC 5.37 (*)    Hemoglobin 11.0 (*)    MCV 67.6 (*)    MCH 20.5 (*)    RDW 15.6 (*)    All other components within normal limits  COMPREHENSIVE METABOLIC PANEL - Abnormal; Notable for the following components:   Potassium 3.3 (*)    Glucose, Bld 221 (*)    BUN 5 (*)    Creatinine, Ser 1.05 (*)    GFR, Estimated 54 (*)    All other components within normal limits  MAGNESIUM - Abnormal; Notable for the following components:   Magnesium 1.6 (*)    All other components within normal limits  TSH - Abnormal; Notable for the following components:   TSH 4.922 (*)    All other components within normal limits  URINALYSIS, ROUTINE W REFLEX MICROSCOPIC - Abnormal; Notable for the following components:   Color, Urine STRAW (*)  Specific Gravity, Urine 1.003 (*)    All other components within normal limits  CBG MONITORING, ED - Abnormal; Notable for the following components:   Glucose-Capillary 176 (*)    All other components within normal limits  TROPONIN I (HIGH SENSITIVITY) - Abnormal; Notable for the following components:   Troponin I (High Sensitivity) 44 (*)    All other components within normal limits  TROPONIN I (HIGH SENSITIVITY) - Abnormal; Notable for the following components:   Troponin I (High  Sensitivity) 69 (*)    All other components within normal limits  RESP PANEL BY RT-PCR (FLU A&B, COVID) [id number]  URINE CULTURE  PROTIME-INR    EKG EKG Interpretation  Date/Time:  Monday January 13 2021 20:41:58 EDT Ventricular Rate:  81 PR Interval:  165 QRS Duration: 47 QT Interval:  532 QTC Calculation: 618 R Axis:   52 Text Interpretation: Sinus rhythm Anteroseptal infarct, old Prolonged QT interval When compared to ECG earlier today, now sinus rhythm. More prolonged QTC. No STEMI Confirmed by Antony Blackbird [phone]) on 01/13/2021 8:43:55 PM  1st EKG Interpretation  Date/Time:  Monday January 13 2021 19:21:39 EDT Ventricular Rate:  106 PR Interval:    QRS Duration: 51 QT Interval:  381 QTC Calculation: 506 R Axis:   48 Text Interpretation: Atrial fibrillation Borderline repolarization abnormality Prolonged QT interval no prior ECG. Afib with RVR. No STEMI Confirmed by Antony Blackbird [phone]) on 01/13/2021 7:49:32 PM   Radiology DG Chest Portable 1 View  Result Date: 01/13/2021 CLINICAL DATA:  Chest pain with new AFib. EXAM: PORTABLE CHEST 1 VIEW COMPARISON:  None. FINDINGS: There is minimal atelectasis in the lower lungs. The lungs are otherwise clear. There is no pleural effusion or pneumothorax. The cardiomediastinal silhouette is within normal limits. No acute fractures are seen. IMPRESSION: No active disease. Electronically Signed   By: Ronney Asters M.D.   On: 01/13/2021 19:58    Procedures Procedures   CRITICAL CARE Performed by: Gwenyth Allegra Shree Espey Total critical care time: 35 minutes Critical care time was exclusive of separately billable procedures and treating other patients. Critical care was necessary to treat or prevent imminent or life-threatening deterioration. Critical care was time spent personally by me on the following activities: development of treatment plan with patient and/or surrogate as well as nursing, discussions with consultants, evaluation  of patient's response to treatment, examination of patient, obtaining history from patient or surrogate, ordering and performing treatments and interventions, ordering and review of laboratory studies, ordering and review of radiographic studies, pulse oximetry and re-evaluation of patient's condition.  CHA2Ds2-VASc Score for Atrial Fibrillation    Patient Score  Age <65 = 0 65-74 = 1 > 75 = 2 2  Sex Female = 0 Female = 1 1  CHF History No = 0  Yes = 1 0  HTN History No = 0  Yes = 1 0  Stroke/TIA/TE History No = 0  Yes = 1 0  Vascular Disease History No = 0  Yes = 1 0  Diabetes History No = 0  Yes = 1 1  Total:  4   4.8 % stroke rate/year from a score of 4   Medications Ordered in ED Medications  diltiazem (CARDIZEM) 125 mg in dextrose 5% 125 mL (1 mg/mL) infusion (0 mg/hr Intravenous Stopped 01/13/21 2046)  potassium chloride SA (KLOR-CON) CR tablet 40 mEq (has no administration in time range)  magnesium oxide (MAG-OX) tablet 400 mg (has no administration in time range)  apixaban (ELIQUIS)  tablet 5 mg (has no administration in time range)  sodium chloride 0.9 % bolus 500 mL (0 mLs Intravenous Stopped 01/13/21 2046)    ED Course  I have reviewed the triage vital signs and the nursing notes.  Pertinent labs & imaging results that were available during my care of the patient were reviewed by me and considered in my medical decision making (see chart for details).    MDM Rules/Calculators/A&P                           Jaime Boone is a 80 y.o. female with a past medical history significant for diabetes, arthritis, asthma, hypertension, and cirrhosis who presents with several days of fatigue, occasional palpitations, and hypoglycemia tonight.  According to patient, she normally takes insulin right after dinner but got distracted and waited about an hour before taking her insulin.  Afterwards, she felt shaky, jittery, and lightheaded and her glucose was in the 60s.  Patient  started eating and after that started going into the 70s again, patient called EMS.  EMS reported that her heart rate was in the 170s and appeared to show A. fib with RVR.  Patient denies any history of A. fib to her knowledge.  Patient was given some diltiazem and heart rate slowed down into the 120s.  On my evaluation heart rate is in the 130s with A. fib and RVR on arrival.  Patient is currently denying any palpitations, shortness of breath, but does report having fatigue and some right-sided chest discomfort.  She reports it is mild and denies any trauma.  She denies any current nausea, vomiting, or diaphoresis.  She denies any new leg pain or leg swelling but has just felt tired recently.  She occasionally gets palpitations and thinks she had an episode several days ago.  There is no clear start to when her symptoms began.  On exam, lungs are clear and chest is nontender.  Abdomen is nontender.  Patient has a regular pulse.  EKG shows A. fib with RVR with no STEMI.  Initial EKG did not crossover in the system so a repeat was done after she was started on diltiazem and has slowed down to around 200.  Patient will have work-up to look for electrolyte abnormality, occult infection, or other causes of the hypoglycemia but I suspect it was due to the delay in her insulin being separated from when she had eaten dinner.  I have low suspicion that the atrial fibrillation is related to the hypoglycemic episode but I am concerned about her new A. fib with RVR and the chest discomfort.  Will get work-up and reassess the patient.  Anticipate admission for new A. fib with RVR without a clear start time with intermittent hypoglycemia and intermittent chest discomfort.  8:40 PM Was just informed that patient converted to a sinus rhythm.  Repeat ECG will be collected.  EKG did show sinus rhythm.  Patient is feeling better.  Cardiology was called who will come see the patient to help determine disposition given  her chest discomfort, elevated troponin, and the intermittent A. fib with RVR tonight.  Anticipate follow-up on cardiology recommendations.  11:36 PM Cardiology saw the patient and feel she is appropriate for discharge home after repletion of potassium, magnesium, and giving her a prescription for Eliquis and low-dose diltiazem.  He feels is appropriate to have her follow-up with the A. fib clinic and discharged as she is now symptom-free.  Patient agrees with this plan and she will be discharged per cardiology.   Final Clinical Impression(s) / ED Diagnoses Final diagnoses:  Atrial fibrillation, unspecified type ([place])  Chest pain, unspecified type  Elevated troponin   Clinical Impression: 1. Atrial fibrillation, unspecified type ([place])   2. Chest pain, unspecified type   3. Elevated troponin     Disposition: Discharge  Condition: Good  I have discussed the results, Dx and Tx plan with the pt(& family if present). He/she/they expressed understanding and agree(s) with the plan. Discharge instructions discussed at great length. Strict return precautions discussed and pt &/or family have verbalized understanding of the instructions. No further questions at time of discharge.    New Prescriptions   APIXABAN (ELIQUIS) 5 MG TABS TABLET    Take 1 tablet (5 mg total) by mouth 2 (two) times daily.   DILTIAZEM (CARDIZEM SR) 60 MG 12 HR CAPSULE    Take 1 capsule (60 mg total) by mouth daily.    Follow Up: [facility] [address] [id number] mc [address] [place]    [place], [place] [address] [place] [address] [id number] [facility]          Anneli Bing, Gwenyth Allegra, MD 01/13/21 [phone]

## 2021-01-13 NOTE — ED Triage Notes (Signed)
Pt reports hypoglycemic episode at home, glucose in the 60s and dizzy/shaky/nervous, called EMS who found her to be in A fib RVR rate 170s. Gave cardizem, came down to 120s

## 2021-01-13 NOTE — Discharge Instructions (Signed)
Your history, exam and work-up today are consistent with atrial fibrillation causing her symptoms and discomfort.  Your glucose fluctuations are likely due to the delay in insulin you took after dinner and your glucose remained stable here.  We do feel you are safe for discharge home after cardiology saw you and your work-up was completed.  They do want you to start blood thinner Eliquis to help prevent strokes and have you follow-up in the A. fib clinic.  Please also take the low-dose diltiazem daily to help with your heart rate and rhythm.  If any symptoms change or worsen, please return to the nearest emergency department.

## 2021-01-14 DIAGNOSIS — R7989 Other specified abnormal findings of blood chemistry: Secondary | ICD-10-CM

## 2021-01-14 DIAGNOSIS — I4891 Unspecified atrial fibrillation: Secondary | ICD-10-CM | POA: Diagnosis not present

## 2021-01-14 DIAGNOSIS — I480 Paroxysmal atrial fibrillation: Secondary | ICD-10-CM

## 2021-01-14 LAB — URINE CULTURE: Culture: <10000 — AB

## 2021-01-14 NOTE — Consult Note (Signed)
Cardiology Consultation:   [name] MRN: [id number]; DOB: 31-Jan-1941  Admit date: 01/13/2021 Date of Consult: 01/14/2021  PCP:  Prince Solian, MD   [name] is a 80 y.o. female with a hx of DMII, HTN, OSA, asthma, GERD and hep C s/p treatment who is being seen 01/14/2021 for Jaime evaluation of atrial fibrillation with RVR and elevated troponins at Jaime request of Dr. Sherry Ruffing.  History of Present Illness:   Jaime Boone was in her USOH until yesterday evening after eating dinner when she developed symptoms of shakiness, lightheadedness, and she overall "felt bad".  She had taken her insulin 1 hour prior to eating dinner, but given her symptoms she checked her blood glucose levels if she found that they were low at 69.  Additionally, her heart started racing and she began experiencing palpitations.  She continue to check her blood glucose levels again and they remain low in Jaime 70s.  Given her symptoms she called EMS and was brought to Jaime ED.  Notably when EMS arrived they found her to be in A. fib with RVR with rates in Jaime 170s.  She was given Cardizem and her heart rates improved to Jaime 120s.  [name], she remained in atrial fibrillation and was started on a diltiazem drip, but quickly converted to NSR.  [name], [name].  She assumed it was her asthma, so she did not investigate any further.  She denies chest pain, SOB, nausea, vomiting, abdominal pain, syncope, weakness, numbness, urinary symptoms.  [name] her VS were initially afebrile, HR 134, BP 117/73, RR 14 and satting 100% on RA.  [name] her HR was 70 and in NSR.  Labs notable for K3.3, magnesium 1.6, TSH 4.9 and troponin 44 -> 69.  CXR WNL.  Cardiology is consulted to evaluate new onset  atrial fibrillation and elevated troponin.   Past Medical History:  Diagnosis Date   Acid reflux    Arthritis    Asthma    Cirrhosis ([place])    Connective tissue disease ([place])    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   Degenerative joint disease    Diabetes mellitus without complication ([place])    Hepatitis C    Hypertension    Sleep apnea     Past Surgical History:  Procedure Laterality Date   ABDOMINAL HYSTERECTOMY     TAH   APPENDECTOMY     DILATION AND CURETTAGE OF UTERUS     EYE SURGERY Left    CATARACT   FOOT SURGERY Bilateral    BUNIONECTOMY   GALLBLADDER SURGERY     keiloid removal      ear   NOSE SURGERY     oral stone extraction       Home Medications:  Prior to Admission medications   Medication Sig Start Date End Date Taking? Authorizing Provider  apixaban (ELIQUIS) 5 MG TABS tablet Take 1 tablet (5 mg total) by mouth 2 (two) times daily. 01/13/21 02/12/21 Yes Tegeler, Gwenyth Allegra, MD  diltiazem (CARDIZEM SR) 60 MG 12 hr capsule Take 1 capsule (60 mg total) by mouth daily. 01/13/21  Yes Tegeler, Gwenyth Allegra, MD  Ascorbic Acid (VITAMIN C PO) Take by mouth.    [provider]  B-D ULTRAFINE  III SHORT PEN 31G X 8 MM MISC SMARTSIG:2 Needle SUB-Q Daily 06/09/19   [provider]  Calcium Carbonate-Vitamin D 600-400 MG-UNIT tablet Take 1 tablet by mouth daily.    [provider]  cetirizine (ZYRTEC) 10 MG tablet Take 10 mg by mouth daily after breakfast.    [provider]  cyclobenzaprine (FLEXERIL) 5 MG tablet Take 5 mg by mouth at bedtime as needed. 04/25/20   [provider]  fluticasone furoate-vilanterol (BREO ELLIPTA) 100-25 MCG/INH AEPB 1 puff by mouth daily 07/23/17   [provider]  gabapentin (NEURONTIN) 300 MG capsule Take 1 capsule (300 mg total) by mouth at bedtime. 06/02/18   Hyatt, Max T, DPM  glipiZIDE (GLUCOTROL) 5 MG tablet TAKE 2 TABLETS BY MOUTH EVERY MORNING AND 1 TABLET EVERY EVENING 06/27/19    [provider]  Lancets (ONETOUCH DELICA PLUS [id number]) MISC U TO TEST BS BID 05/15/18   [provider]  LANTUS SOLOSTAR 100 UNIT/ML Solostar Pen SMARTSIG:35 Unit(s) SUB-Q Daily 02/27/19   [provider]  levothyroxine (SYNTHROID, LEVOTHROID) 50 MCG tablet Take 50 mcg by mouth daily before breakfast.  04/27/13   [provider]  losartan (COZAAR) 50 MG tablet Take 25 mg by mouth 2 (two) times daily. 06/26/19   [provider]  meclizine (ANTIVERT) 25 MG tablet Take 1 tablet (25 mg total) by mouth 3 (three) times daily as needed for dizziness. 09/06/20   Andrew Au, MD  methocarbamol (ROBAXIN) 500 MG tablet Take 1 tablet (500 mg total) by mouth 2 (two) times daily. 01/26/17   Maczis, Barth Kirks, PA-C  montelukast (SINGULAIR) 10 MG tablet Take 10 mg by mouth daily after breakfast.     [provider]  Multiple Vitamin (MULTIVITAMIN) capsule Take 1 capsule by mouth daily.    [provider]  Multiple Vitamins-Minerals (DAILY MULTIVITAMIN) CAPS one capsule PO daily 07/14/16   [provider]  ONE TOUCH ULTRA TEST test strip  04/03/13   [provider]  pantoprazole (PROTONIX) 40 MG tablet Take 40 mg by mouth 2 (two) times daily.     [provider]  PROAIR HFA 108 (90 BASE) MCG/ACT inhaler Inhale 2 puffs into Jaime lungs every 6 (six) hours as needed for wheezing or shortness of breath.  04/19/13   [provider]  triamcinolone cream (KENALOG) 0.1 % Apply 1 application topically daily as needed (skin irritation).  09/18/13   [provider]    Inpatient Medications: Scheduled Meds:  apixaban  5 mg Oral BID   magnesium oxide  400 mg Oral Once   potassium chloride  40 mEq Oral Once   Continuous Infusions:  diltiazem (CARDIZEM) infusion Stopped (01/13/21 2046)   PRN Meds:   Allergies:    Allergies  Allergen Reactions   Aspirin Other (See Comments)    Stomach pain   Ciprofloxacin Nausea  And Vomiting   Codeine Nausea And Vomiting   Nitrofuran Derivatives    Penicillins Swelling    THROAT Has Boone had a PCN reaction causing immediate rash, facial/tongue/throat swelling, SOB or lightheadedness with hypotension: yes Has Boone had a PCN reaction causing severe rash involving mucus membranes or skin necrosis: no Has Boone had a PCN reaction that required hospitalization: no Has Boone had a PCN reaction occurring within Jaime last 10 years: no If all of Jaime above answers are "NO", then may proceed with Cephalosporin use.     Social History:   Social History   Socioeconomic History  Marital status: Divorced    Spouse name: Not on file   Number of children: 3   Years of education: Not on file   Highest education level: Not on file  Occupational History   Occupation: Retired    Fish farm manager: RETIRED  Tobacco Use   Smoking status: Former    Types: Cigarettes    Quit date: 03/01/1974    Years since quitting: 46.9   Smokeless tobacco: Never  Vaping Use   Vaping Use: Never used  Substance and Sexual Activity   Alcohol use: Yes    Alcohol/week: 0.0 standard drinks    Comment: rare   Drug use: No   Sexual activity: Never  Other Topics Concern   Not on file  Social History Narrative   Not on file   Social Determinants of Health   Financial Resource Strain: Not on file  Food Insecurity: Not on file  Transportation Needs: Not on file  Physical Activity: Not on file  Stress: Not on file  Social Connections: Not on file  Intimate Partner Violence: Not on file    Family History:    Family History  Problem Relation Age of Onset   Hypertension Mother    Diabetes Mother    Hypertension Father    Diabetes Father    Heart disease Father    Prostate cancer Father    Diabetes Sister    Heart disease Brother    Prostate cancer Brother    Lung cancer Cousin    Colon cancer Neg Hx    Stomach cancer Neg Hx    Esophageal cancer Neg Hx    Pancreatic cancer Neg  Hx    Liver disease Neg Hx      ROS:  Please see Jaime history of present illness.   All other ROS reviewed and negative.     Physical Exam/Data:   Vitals:   01/13/21 2040 01/13/21 2210 01/13/21 2315 01/13/21 2330  BP: 120/76 125/70 128/82 111/71  Pulse: 82 73 72 68  Resp: 16 12 18 19   Temp:      TempSrc:      SpO2: 97% 97% 97% 95%  Weight:      Height:        Intake/Output Summary (Last 24 hours) at 01/14/2021 0003 Last data filed at 01/13/2021 2046 Gross per 24 hour  Intake 504.13 ml  Output --  Net 504.13 ml   Last 3 Weights 01/13/2021 09/06/2020 03/17/2017  Weight (lbs) 220 lb 219 lb 198 lb  Weight (kg) 99.791 kg 99.338 kg 89.812 kg     Body mass index is 38.97 kg/m.  General:  Well nourished, well developed, in no acute distress HEENT: Atraumatic, normocephalic, MMM Neck: no JVD Vascular: Distal pulses 2+ bilaterally Cardiac:  normal S1, S2; RRR; no murmur  Lungs:  clear to auscultation bilaterally, no wheezing, rhonchi or rales  Abd: soft, nontender, no hepatomegaly, obese Ext: no edema Musculoskeletal:  No deformities, BUE and BLE strength normal and equal Skin: warm and dry  Neuro:  CNs 2-12 intact, no focal abnormalities noted Psych:  Normal affect   EKG:  Jaime EKG was personally reviewed and demonstrates:       Telemetry:  Telemetry was personally reviewed and demonstrates:  NSR  Relevant CV Studies:  N/A  Laboratory Data:  High Sensitivity Troponin:   Recent Labs  Lab 01/13/21 1932 01/13/21 2213  TROPONINIHS 4* 55*     Chemistry Recent Labs  Lab 01/13/21 1932  NA 137  K  3.3*  CL 103  CO2 22  GLUCOSE 221*  BUN 5*  CREATININE 1.05*  CALCIUM 9.2  MG 1.6*  GFRNONAA 54*  ANIONGAP 12    Recent Labs  Lab 01/13/21 1932  PROT 6.9  ALBUMIN 3.5  AST 30  ALT 24  ALKPHOS 72  BILITOT 0.5   Lipids No results for input(s): CHOL, TRIG, HDL, LABVLDL, LDLCALC, CHOLHDL in Jaime last 168 hours.  Hematology Recent Labs  Lab  01/13/21 1932  WBC 9.3  RBC 5.37*  HGB 11.0*  HCT 36.3  MCV 67.6*  MCH 20.5*  MCHC 30.3  RDW 15.6*  PLT 248   Thyroid  Recent Labs  Lab 01/13/21 1907  TSH 4.922*    BNPNo results for input(s): BNP, PROBNP in Jaime last 168 hours.  DDimer No results for input(s): DDIMER in Jaime last 168 hours.   Radiology/Studies:  DG Chest Portable 1 View  Result Date: 01/13/2021 CLINICAL DATA:  Chest pain with new AFib. EXAM: PORTABLE CHEST 1 VIEW COMPARISON:  None. FINDINGS: There is minimal atelectasis in Jaime lower lungs. Jaime lungs are otherwise clear. There is no pleural effusion or pneumothorax. Jaime cardiomediastinal silhouette is within normal limits. No acute fractures are seen. IMPRESSION: No active disease. Electronically Signed   By: Ronney Asters M.D.   On: 01/13/2021 19:58     Assessment and Plan:   AALIVIA MCGRAW is a 80 y.o. female with a hx of DMII, HTN, OSA, asthma, GERD and hep C s/p treatment who is being seen 01/14/2021 for Jaime evaluation of atrial fibrillation with RVR and elevated troponins at Jaime request of Dr. Sherry Ruffing.  #New Onset Paroxsymal Afib #Myocardial Injury 2/2 Afib with RVR :: Boone presented with seemingly new onset atrial fibrillation that perhaps started back in June when she first developed palpitations. Currently back in NSR and is asymptomatic.  No reports of chest pain or shortness of breath.  Jaime Boone's CHA2DS2-VASc score = 5 placing Jaime Boone at an increased risk for stroke.  I discussed Jaime risk and benefits of starting [place] and Jaime Boone is agreeable to starting apixaban.  I think she will also need rate control and diltiazem is a reasonable drug of choice.  Ultimately she has several risk factors for Jaime development of A. fib including HTN, OSA, electrolyte derangements, and an elevated TSH which could reflect thyroid dysfunction.  I recommend repeating her electrolytes, starting apixaban 5 mg twice daily, and diltiazem for rate control.  Ultimately  Jaime Boone will need a TTE to evaluate for structural heart disease and repeat lab work including TFTs, but given her clinical stability she did not need to be admitted for this condition.  I recommend Jaime Boone follow-up closely with cardiology as an outpatient within Jaime next 7 to 10 days.  Lastly, in terms of elevated troponin I think this is likely myocardial injury in Jaime setting of A. fib with RVR and not reflective of ACS. -Start diltiazem 120 mg daily for rate control -Start apixaban 5 mg twice daily -Please arrange close follow-up with cardiology in 7 to 10 days -Replete magnesium and potassium -Okay to discharge from a atrial fibrillation perspective -We will need TTE, repeat BMP/mag and TFTs at follow-up -Ensure CPAP use   #Electrolyte Derangements -Replete K >4, Mg >2   #Elevated TSH -TSH mildly elevated in Jaime setting of acute illness.  Recommend repeating as an outpatient with Jaime addition of checking a FT4.  If she is truly hypothyroid then we will  need to treat to optimize A. fib management.     Risk Assessment/Risk Scores:          CHA2DS2-VASc Score = 5   This indicates a 7.2% annual risk of stroke. Jaime Boone's score is based upon:       [facility] will sign off.   Medication Recommendations:  diltiazem 120 mg daily, apixaban 5 mg daily Other recommendations (labs, testing, etc):  BMP, Mg, TFTs at follow up Follow up as an outpatient:  with cardiology and Afib clinic  For questions or updates, please contact [facility] Please consult [email or web address] for contact info under    Signed, Hershal Coria, MD  01/14/2021 12:03 AM

## 2021-01-15 ENCOUNTER — Ambulatory Visit: Payer: Medicare Other | Admitting: Physical Therapy

## 2021-01-20 ENCOUNTER — Other Ambulatory Visit: Payer: Self-pay

## 2021-01-20 ENCOUNTER — Ambulatory Visit (HOSPITAL_COMMUNITY)
Admission: RE | Admit: 2021-01-20 | Discharge: 2021-01-20 | Disposition: A | Discharge status: Home / Self Care | Payer: Medicare Other | Source: Ambulatory Visit | Attending: Physician Assistant | Admitting: Physician Assistant

## 2021-01-20 VITALS — BP 148/86 | HR 80 | Ht 63.0 in | Wt 218.6 lb

## 2021-01-20 DIAGNOSIS — D6869 Other thrombophilia: Secondary | ICD-10-CM | POA: Diagnosis not present

## 2021-01-20 DIAGNOSIS — G4730 Sleep apnea, unspecified: Secondary | ICD-10-CM | POA: Diagnosis not present

## 2021-01-20 DIAGNOSIS — Z888 Allergy status to other drugs, medicaments and biological substances status: Secondary | ICD-10-CM | POA: Insufficient documentation

## 2021-01-20 DIAGNOSIS — Z880 Allergy status to penicillin: Secondary | ICD-10-CM | POA: Insufficient documentation

## 2021-01-20 DIAGNOSIS — Z6838 Body mass index (BMI) 38.0-38.9, adult: Secondary | ICD-10-CM | POA: Insufficient documentation

## 2021-01-20 DIAGNOSIS — Z881 Allergy status to other antibiotic agents status: Secondary | ICD-10-CM | POA: Diagnosis not present

## 2021-01-20 DIAGNOSIS — Z7984 Long term (current) use of oral hypoglycemic drugs: Secondary | ICD-10-CM | POA: Diagnosis not present

## 2021-01-20 DIAGNOSIS — Z7901 Long term (current) use of anticoagulants: Secondary | ICD-10-CM | POA: Diagnosis not present

## 2021-01-20 DIAGNOSIS — Z7951 Long term (current) use of inhaled steroids: Secondary | ICD-10-CM | POA: Insufficient documentation

## 2021-01-20 DIAGNOSIS — G4733 Obstructive sleep apnea (adult) (pediatric): Secondary | ICD-10-CM | POA: Diagnosis not present

## 2021-01-20 DIAGNOSIS — Z79899 Other long term (current) drug therapy: Secondary | ICD-10-CM | POA: Diagnosis not present

## 2021-01-20 DIAGNOSIS — Z79890 Hormone replacement therapy (postmenopausal): Secondary | ICD-10-CM | POA: Insufficient documentation

## 2021-01-20 DIAGNOSIS — I10 Essential (primary) hypertension: Secondary | ICD-10-CM | POA: Insufficient documentation

## 2021-01-20 DIAGNOSIS — Z7182 Exercise counseling: Secondary | ICD-10-CM | POA: Diagnosis not present

## 2021-01-20 DIAGNOSIS — Z87891 Personal history of nicotine dependence: Secondary | ICD-10-CM | POA: Diagnosis not present

## 2021-01-20 DIAGNOSIS — Z885 Allergy status to narcotic agent status: Secondary | ICD-10-CM | POA: Insufficient documentation

## 2021-01-20 DIAGNOSIS — I480 Paroxysmal atrial fibrillation: Secondary | ICD-10-CM | POA: Insufficient documentation

## 2021-01-20 DIAGNOSIS — Z886 Allergy status to analgesic agent status: Secondary | ICD-10-CM | POA: Diagnosis not present

## 2021-01-20 NOTE — Progress Notes (Signed)
Primary Care Physician: Prince Solian, MD Primary Cardiologist: none Primary Electrophysiologist: none Referring Physician: [facility]   Jaime Boone is a 80 y.o. female with a history of DM, HTN, OSA, asthma, hep C, atrial fibrillation who presents for consultation in the [facility].  The patient was initially diagnosed with atrial fibrillation 01/13/21 after presenting to the ED with symptoms of shakiness, lightheadedness, and palpitations. EMS was called and ECG showed afib with RVR. She was started on a diltiazem drip in the ED and converted to SR. Patient was started on Eliquis for a CHADS2VASC score of 5 and diltiazem for rate control. Patient reports that she has had intermittent episodes of shakiness and lightheadedness since leaving the ED. She checked her blood glucose during these episodes and it was low. She ate something sweet and her symptoms resolved. She denies any bleeding issues on anticoagulation. She was diagnosed with OSA remotely but never started CPAP.  Today, she denies symptoms of palpitations, chest pain, shortness of breath, orthopnea, PND, lower extremity edema, presyncope, syncope, bleeding, or neurologic sequela. The patient is tolerating medications without difficulties and is otherwise without complaint today.    Atrial Fibrillation Risk Factors:  she does have symptoms or diagnosis of sleep apnea. she is not compliant with CPAP therapy. she does not have a history of rheumatic fever. she does not have a history of alcohol use.   she has a BMI of Body mass index is 38.72 kg/m.Marland Kitchen Filed Weights   01/20/21 0932  Weight: 99.2 kg    Family History  Problem Relation Age of Onset   Hypertension Mother    Diabetes Mother    Hypertension Father    Diabetes Father    Heart disease Father    Prostate cancer Father    Diabetes Sister    Heart disease Brother    Prostate cancer Brother    Lung cancer Cousin    Colon  cancer Neg Hx    Stomach cancer Neg Hx    Esophageal cancer Neg Hx    Pancreatic cancer Neg Hx    Liver disease Neg Hx      Atrial Fibrillation Management history:  Previous antiarrhythmic drugs: none Previous cardioversions: none Previous ablations: none CHADS2VASC score: 5 Anticoagulation history: Eliquis   Past Medical History:  Diagnosis Date   Acid reflux    Arthritis    Asthma    Cirrhosis ([place])    Connective tissue disease ([place])    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   Degenerative joint disease    Diabetes mellitus without complication ([place])    Hepatitis C    Hypertension    Sleep apnea    Past Surgical History:  Procedure Laterality Date   ABDOMINAL HYSTERECTOMY     TAH   APPENDECTOMY     DILATION AND CURETTAGE OF UTERUS     EYE SURGERY Left    CATARACT   FOOT SURGERY Bilateral    BUNIONECTOMY   GALLBLADDER SURGERY     keiloid removal      ear   NOSE SURGERY     oral stone extraction      Current Outpatient Medications  Medication Sig Dispense Refill   apixaban (ELIQUIS) 5 MG TABS tablet Take 1 tablet (5 mg total) by mouth 2 (two) times daily. 60 tablet 0   Ascorbic Acid (VITAMIN C PO) Take by mouth.     B-D ULTRAFINE III SHORT PEN 31G X 8 MM MISC SMARTSIG:2 Needle SUB-Q  Daily     Calcium Carbonate-Vitamin D 600-400 MG-UNIT tablet Take 1 tablet by mouth daily.     cetirizine (ZYRTEC) 10 MG tablet Take 10 mg by mouth daily after breakfast.     diltiazem (CARDIZEM SR) 60 MG 12 hr capsule Take 1 capsule (60 mg total) by mouth daily. 30 capsule -   fluticasone furoate-vilanterol (BREO ELLIPTA) 100-25 MCG/INH AEPB 1 puff by mouth daily     glipiZIDE (GLUCOTROL) 5 MG tablet TAKE 2 TABLETS BY MOUTH EVERY MORNING AND 1 TABLET EVERY EVENING     Lancets (ONETOUCH DELICA PLUS [id number]) MISC U TO TEST BS BID     LANTUS SOLOSTAR 100 UNIT/ML Solostar Pen SMARTSIG:35 Unit(s) SUB-Q Daily     levothyroxine (SYNTHROID, LEVOTHROID) 50 MCG tablet Take 50 mcg by mouth  daily before breakfast.      losartan (COZAAR) 50 MG tablet Take 25 mg by mouth 2 (two) times daily.     montelukast (SINGULAIR) 10 MG tablet Take 10 mg by mouth daily after breakfast.      Multiple Vitamin (MULTIVITAMIN) capsule Take 1 capsule by mouth daily.     Multiple Vitamins-Minerals (DAILY MULTIVITAMIN) CAPS one capsule PO daily     ONE TOUCH ULTRA TEST test strip      pantoprazole (PROTONIX) 40 MG tablet Take 40 mg by mouth 2 (two) times daily.      PROAIR HFA 108 (90 BASE) MCG/ACT inhaler Inhale 2 puffs into the lungs every 6 (six) hours as needed for wheezing or shortness of breath.      psyllium (METAMUCIL) 58.6 % powder Taking 1 tsp by mouth twice daily     triamcinolone cream (KENALOG) 0.1 % Apply 1 application topically daily as needed (skin irritation).      No current facility-administered medications for this encounter.    Allergies  Allergen Reactions   Aspirin Other (See Comments)    Stomach pain   Ciprofloxacin Nausea And Vomiting   Codeine Nausea And Vomiting   Duloxetine Hcl Nausea And Vomiting   Nitrofuran Derivatives    Penicillins Swelling    THROAT Has patient had a PCN reaction causing immediate rash, facial/tongue/throat swelling, SOB or lightheadedness with hypotension: yes Has patient had a PCN reaction causing severe rash involving mucus membranes or skin necrosis: no Has patient had a PCN reaction that required hospitalization: no Has patient had a PCN reaction occurring within the last 10 years: no If all of the above answers are "NO", then may proceed with Cephalosporin use.     Social History   Socioeconomic History   Marital status: Divorced    Spouse name: Not on file   Number of children: 3   Years of education: Not on file   Highest education level: Not on file  Occupational History   Occupation: Retired    Fish farm manager: RETIRED  Tobacco Use   Smoking status: Former    Types: Cigarettes    Quit date: 03/01/1974    Years since quitting:  46.9   Smokeless tobacco: Never  Vaping Use   Vaping Use: Never used  Substance and Sexual Activity   Alcohol use: Yes    Alcohol/week: 0.0 standard drinks    Comment: rare   Drug use: No   Sexual activity: Never  Other Topics Concern   Not on file  Social History Narrative   Not on file   Social Determinants of Health   Financial Resource Strain: Not on file  Food Insecurity: Not on file  Transportation Needs: Not on file  Physical Activity: Not on file  Stress: Not on file  Social Connections: Not on file  Intimate Partner Violence: Not on file     ROS- All systems are reviewed and negative except as per the HPI above.  Physical Exam: Vitals:   01/20/21 0932  BP: (!) 148/86  Pulse: 80  Weight: 99.2 kg  Height: 5\' 3"  (1.6 m)    GEN- The patient is a well appearing obese elderly female, alert and oriented x 3 today.   Head- normocephalic, atraumatic Eyes-  Sclera clear, conjunctiva pink Ears- hearing intact Oropharynx- clear Neck- supple  Lungs- Clear to ausculation bilaterally, normal work of breathing Heart- Regular rate and rhythm, no murmurs, rubs or gallops  GI- soft, NT, ND, + BS Extremities- no clubbing, cyanosis, or edema MS- no significant deformity or atrophy Skin- no rash or lesion Psych- euthymic mood, full affect Neuro- strength and sensation are intact  Wt Readings from Last 3 Encounters:  01/20/21 99.2 kg  01/13/21 99.8 kg  09/06/20 99.3 kg    EKG today demonstrates  SR Vent. rate 80 BPM PR interval 156 ms QRS duration 64 ms QT/QTcB 352/405 ms  Epic records are reviewed at length today  CHA2DS2-VASc Score = 5  The patient's score is based upon: CHF History: 0 HTN History: 1 Diabetes History: 1 Stroke History: 0 Vascular Disease History: 0 Age Score: 2 Gender Score: 1       ASSESSMENT AND PLAN: 1. Paroxysmal Atrial Fibrillation (ICD10:  I48.0) The patient's CHA2DS2-VASc score is 5, indicating a 7.2% annual risk of stroke.    General education about afib provided and questions answered. We also discussed her stroke risk and the risks and benefits of anticoagulation. ? Episodes related to hypoglycemia.  Check echocardiogram Continue Eliquis 5 mg BID Continue diltiazem 120 mg daily   2. Secondary Hypercoagulable State (ICD10:  D68.69) The patient is at significant risk for stroke/thromboembolism based upon her CHA2DS2-VASc Score of 5.  Continue Apixaban (Eliquis).   3. Obesity Body mass index is 38.72 kg/m. Lifestyle modification was discussed at length including regular exercise and weight reduction.  4. Obstructive sleep apnea The importance of adequate treatment of sleep apnea was discussed today in order to improve our ability to maintain sinus rhythm long term. Will refer for sleep study and to establish with sleep medicine.   5. HTN Stable, no changes today.   Follow up in the AF clinic in 4-6 weeks. Will also refer to have her establish care with primary cardiologist.    Adline Peals PA-C [facility] [address] [place], [place] [address] [phone] 01/20/2021 9:52 AM

## 2021-02-28 ENCOUNTER — Ambulatory Visit (HOSPITAL_COMMUNITY)
Admission: RE | Admit: 2021-02-28 | Discharge: 2021-02-28 | Disposition: A | Discharge status: Home / Self Care | Payer: Medicare Other | Source: Ambulatory Visit | Attending: Physician Assistant | Admitting: Physician Assistant

## 2021-02-28 ENCOUNTER — Other Ambulatory Visit: Payer: Self-pay

## 2021-02-28 DIAGNOSIS — E119 Type 2 diabetes mellitus without complications: Secondary | ICD-10-CM | POA: Diagnosis not present

## 2021-02-28 DIAGNOSIS — I340 Nonrheumatic mitral (valve) insufficiency: Secondary | ICD-10-CM | POA: Insufficient documentation

## 2021-02-28 DIAGNOSIS — I480 Paroxysmal atrial fibrillation: Secondary | ICD-10-CM | POA: Insufficient documentation

## 2021-02-28 DIAGNOSIS — I10 Essential (primary) hypertension: Secondary | ICD-10-CM | POA: Insufficient documentation

## 2021-02-28 LAB — ECHOCARDIOGRAM COMPLETE
Area-P 1/2: 3.27 cm2
S' Lateral: 3.1 cm

## 2021-03-04 ENCOUNTER — Encounter (HOSPITAL_COMMUNITY): Payer: Self-pay | Admitting: Physician Assistant

## 2021-03-04 ENCOUNTER — Other Ambulatory Visit: Payer: Self-pay

## 2021-03-04 ENCOUNTER — Ambulatory Visit (HOSPITAL_COMMUNITY)
Admission: RE | Admit: 2021-03-04 | Discharge: 2021-03-04 | Disposition: A | Discharge status: Home / Self Care | Payer: Medicare Other | Source: Ambulatory Visit | Attending: Physician Assistant | Admitting: Physician Assistant

## 2021-03-04 VITALS — BP 140/82 | HR 77 | Ht 63.0 in | Wt 216.6 lb

## 2021-03-04 DIAGNOSIS — E119 Type 2 diabetes mellitus without complications: Secondary | ICD-10-CM | POA: Insufficient documentation

## 2021-03-04 DIAGNOSIS — G4733 Obstructive sleep apnea (adult) (pediatric): Secondary | ICD-10-CM | POA: Insufficient documentation

## 2021-03-04 DIAGNOSIS — E669 Obesity, unspecified: Secondary | ICD-10-CM | POA: Insufficient documentation

## 2021-03-04 DIAGNOSIS — D6859 Other primary thrombophilia: Secondary | ICD-10-CM | POA: Insufficient documentation

## 2021-03-04 DIAGNOSIS — I10 Essential (primary) hypertension: Secondary | ICD-10-CM | POA: Insufficient documentation

## 2021-03-04 DIAGNOSIS — D6869 Other thrombophilia: Secondary | ICD-10-CM | POA: Diagnosis not present

## 2021-03-04 DIAGNOSIS — Z6838 Body mass index (BMI) 38.0-38.9, adult: Secondary | ICD-10-CM | POA: Insufficient documentation

## 2021-03-04 DIAGNOSIS — I480 Paroxysmal atrial fibrillation: Secondary | ICD-10-CM | POA: Insufficient documentation

## 2021-03-04 DIAGNOSIS — Z7901 Long term (current) use of anticoagulants: Secondary | ICD-10-CM | POA: Insufficient documentation

## 2021-03-04 DIAGNOSIS — B1920 Unspecified viral hepatitis C without hepatic coma: Secondary | ICD-10-CM | POA: Diagnosis not present

## 2021-03-04 DIAGNOSIS — Z91198 Patient's noncompliance with other medical treatment and regimen for other reason: Secondary | ICD-10-CM | POA: Diagnosis not present

## 2021-03-04 LAB — CBC
HCT: 37.1 % (ref 36.0–46.0)
Hemoglobin: 11.2 g/dL — ABNORMAL LOW (ref 12.0–15.0)
MCH: 20.2 pg — ABNORMAL LOW (ref 26.0–34.0)
MCHC: 30.2 g/dL (ref 30.0–36.0)
MCV: 66.8 fL — ABNORMAL LOW (ref 80.0–100.0)
Platelets: 302 10*3/uL (ref 150–400)
RBC: 5.55 MIL/uL — ABNORMAL HIGH (ref 3.87–5.11)
RDW: 15.4 % (ref 11.5–15.5)
WBC: 6.3 10*3/uL (ref 4.0–10.5)
nRBC: 0 % (ref 0.0–0.2)

## 2021-03-04 LAB — BASIC METABOLIC PANEL
Anion gap: 9 (ref 5–15)
BUN: 7 mg/dL — ABNORMAL LOW (ref 8–23)
CO2: 22 mmol/L (ref 22–32)
Calcium: 9.3 mg/dL (ref 8.9–10.3)
Chloride: 104 mmol/L (ref 98–111)
Creatinine, Ser: 0.96 mg/dL (ref 0.44–1.00)
GFR, Estimated: >60 mL/min (ref >60)
Glucose, Bld: 258 mg/dL — ABNORMAL HIGH (ref 70–99)
Potassium: 4.5 mmol/L (ref 3.5–5.1)
Sodium: 135 mmol/L (ref 135–145)

## 2021-03-04 NOTE — Progress Notes (Signed)
Primary Care Physician: Prince Solian, MD Primary Cardiologist: Dr Irish Lack (new) Primary Electrophysiologist: none Referring Physician: [facility]   Jaime Boone is a 80 y.o. female with a history of DM, HTN, OSA, asthma, hep C, atrial fibrillation who presents for follow up in the [facility].  The patient was initially diagnosed with atrial fibrillation 01/13/21 after presenting to the ED with symptoms of shakiness, lightheadedness, and palpitations. EMS was called and ECG showed afib with RVR. She was started on a diltiazem drip in the ED and converted to SR. Patient was started on Eliquis for a CHADS2VASC score of 5 and diltiazem for rate control. Patient reports that she has had intermittent episodes of shakiness and lightheadedness since leaving the ED. She checked her blood glucose during these episodes and it was low. She ate something sweet and her symptoms resolved. She was diagnosed with OSA remotely but never started CPAP.  On follow up today, patient reports that she has done well since her last visit. No heart racing or palpitations. She had a echo which showed preserved EF 60-65%. She denies any bleeding issues on anticoagulation.   Today, she denies symptoms of palpitations, chest pain, shortness of breath, orthopnea, PND, lower extremity edema, presyncope, syncope, bleeding, or neurologic sequela. The patient is tolerating medications without difficulties and is otherwise without complaint today.    Atrial Fibrillation Risk Factors:  she does have symptoms or diagnosis of sleep apnea. she is not compliant with CPAP therapy. she does not have a history of rheumatic fever. she does not have a history of alcohol use.   she has a BMI of Body mass index is 38.37 kg/m.Marland Kitchen Filed Weights   03/04/21 0925  Weight: 98.2 kg     Family History  Problem Relation Age of Onset   Hypertension Mother    Diabetes Mother    Hypertension Father     Diabetes Father    Heart disease Father    Prostate cancer Father    Diabetes Sister    Heart disease Brother    Prostate cancer Brother    Lung cancer Cousin    Colon cancer Neg Hx    Stomach cancer Neg Hx    Esophageal cancer Neg Hx    Pancreatic cancer Neg Hx    Liver disease Neg Hx      Atrial Fibrillation Management history:  Previous antiarrhythmic drugs: none Previous cardioversions: none Previous ablations: none CHADS2VASC score: 5 Anticoagulation history: Eliquis   Past Medical History:  Diagnosis Date   Acid reflux    Acute bilateral knee pain    Arthritis    Asthma    Asthma    Atrial fibrillation ([place])    Chest pain    Cirrhosis ([place])    Connective tissue disease ([place])    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   Degenerative joint disease    Diabetes mellitus without complication (HCC)    DJD (degenerative joint disease)    DM (diabetes mellitus) ([place])    Fatigue    Fatty liver    GERD (gastroesophageal reflux disease)    Hepatitis C    History of hepatitis C    Hypertension    Jaw pain    OSA (obstructive sleep apnea)    Sleep apnea    Tiredness    Past Surgical History:  Procedure Laterality Date   ABDOMINAL HYSTERECTOMY     TAH   APPENDECTOMY     DILATION AND CURETTAGE OF UTERUS  EYE SURGERY Left    CATARACT   FOOT SURGERY Bilateral    BUNIONECTOMY   GALLBLADDER SURGERY     keiloid removal      ear   NOSE SURGERY     oral stone extraction      Current Outpatient Medications  Medication Sig Dispense Refill   apixaban (ELIQUIS) 5 MG TABS tablet Take 1 tablet (5 mg total) by mouth 2 (two) times daily. 60 tablet 0   Ascorbic Acid (VITAMIN C PO) Take by mouth.     B-D ULTRAFINE III SHORT PEN 31G X 8 MM MISC SMARTSIG:2 Needle SUB-Q Daily     Calcium Carbonate-Vitamin D 600-400 MG-UNIT tablet Take 1 tablet by mouth daily.     cetirizine (ZYRTEC) 10 MG tablet Take 10 mg by mouth daily after breakfast.     diltiazem (CARDIZEM SR) 60 MG 12  hr capsule Take 1 capsule (60 mg total) by mouth daily. 30 capsule -   fluticasone furoate-vilanterol (BREO ELLIPTA) 100-25 MCG/INH AEPB 1 puff by mouth daily     glipiZIDE (GLUCOTROL) 5 MG tablet TAKE 2 TABLETS BY MOUTH EVERY MORNING AND 1 TABLET EVERY EVENING     Lancets (ONETOUCH DELICA PLUS [id number]) MISC U TO TEST BS BID     LANTUS SOLOSTAR 100 UNIT/ML Solostar Pen SMARTSIG:35 Unit(s) SUB-Q Daily     levothyroxine (SYNTHROID) 75 MCG tablet Take 75 mcg by mouth every morning.     losartan (COZAAR) 50 MG tablet Take 25 mg by mouth 2 (two) times daily.     montelukast (SINGULAIR) 10 MG tablet Take 10 mg by mouth daily after breakfast.      Multiple Vitamin (MULTIVITAMIN) capsule Take 1 capsule by mouth daily.     Multiple Vitamins-Minerals (DAILY MULTIVITAMIN) CAPS one capsule PO daily     ONE TOUCH ULTRA TEST test strip      pantoprazole (PROTONIX) 40 MG tablet Take 40 mg by mouth 2 (two) times daily.      prednisoLONE acetate (PRED FORTE) 1 % ophthalmic suspension Place 1 drop into the right eye 4 (four) times daily.     PROAIR HFA 108 (90 BASE) MCG/ACT inhaler Inhale 2 puffs into the lungs every 6 (six) hours as needed for wheezing or shortness of breath.      psyllium (METAMUCIL) 58.6 % powder Taking 1 tsp by mouth twice daily     tobramycin (TOBREX) 0.3 % ophthalmic solution Place 1 drop into the right eye 4 (four) times daily.     triamcinolone cream (KENALOG) 0.1 % Apply 1 application topically daily as needed (skin irritation).      No current facility-administered medications for this encounter.    Allergies  Allergen Reactions   Aspirin Other (See Comments)    Stomach pain   Ciprofloxacin Nausea And Vomiting   Codeine Nausea And Vomiting   Duloxetine Hcl Nausea And Vomiting   Nitrofuran Derivatives    Penicillins Swelling    THROAT Has patient had a PCN reaction causing immediate rash, facial/tongue/throat swelling, SOB or lightheadedness with hypotension: yes Has patient  had a PCN reaction causing severe rash involving mucus membranes or skin necrosis: no Has patient had a PCN reaction that required hospitalization: no Has patient had a PCN reaction occurring within the last 10 years: no If all of the above answers are "NO", then may proceed with Cephalosporin use.     Social History   Socioeconomic History   Marital status: Divorced    Spouse name: Not  on file   Number of children: 3   Years of education: Not on file   Highest education level: Not on file  Occupational History   Occupation: Retired    Fish farm manager: RETIRED  Tobacco Use   Smoking status: Former    Types: Cigarettes    Quit date: 03/01/1974    Years since quitting: 47.0   Smokeless tobacco: Never   Tobacco comments:    Former smoker 03/04/2021  Vaping Use   Vaping Use: Never used  Substance and Sexual Activity   Alcohol use: Yes    Alcohol/week: 2.0 standard drinks    Types: 2 Standard drinks or equivalent per week    Comment: rare   Drug use: No   Sexual activity: Never  Other Topics Concern   Not on file  Social History Narrative   Not on file   Social Determinants of Health   Financial Resource Strain: Not on file  Food Insecurity: Not on file  Transportation Needs: Not on file  Physical Activity: Not on file  Stress: Not on file  Social Connections: Not on file  Intimate Partner Violence: Not on file     ROS- All systems are reviewed and negative except as per the HPI above.  Physical Exam: Vitals:   03/04/21 0925  BP: 140/82  Pulse: 77  Weight: 98.2 kg  Height: 5\' 3"  (1.6 m)    GEN- The patient is a well appearing obese elderly female, alert and oriented x 3 today.   HEENT-head normocephalic, atraumatic, sclera clear, conjunctiva pink, hearing intact, trachea midline. Lungs- Clear to ausculation bilaterally, normal work of breathing Heart- Regular rate and rhythm, no murmurs, rubs or gallops  GI- soft, NT, ND, + BS Extremities- no clubbing, cyanosis,  or edema MS- no significant deformity or atrophy Skin- no rash or lesion Psych- euthymic mood, full affect Neuro- strength and sensation are intact   Wt Readings from Last 3 Encounters:  03/04/21 98.2 kg  01/20/21 99.2 kg  01/13/21 99.8 kg    EKG today demonstrates  SR Vent. rate 77 BPM PR interval 140 ms QRS duration 70 ms QT/QTcB 202/228 ms  Echo 02/28/21 demonstrated  1. Left ventricular ejection fraction, by estimation, is 60 to 65%. The  left ventricle has normal function. The left ventricle has no regional  wall motion abnormalities. There is mild concentric left ventricular  hypertrophy. Left ventricular diastolic parameters are consistent with Grade I diastolic dysfunction (impaired relaxation).   2. Right ventricular systolic function is normal. The right ventricular  size is normal. There is normal pulmonary artery systolic pressure. The estimated right ventricular systolic pressure is 11.9 mmHg.   3. The mitral valve is normal in structure. Trivial mitral valve  regurgitation. No evidence of mitral stenosis.   4. The aortic valve is normal in structure. Aortic valve regurgitation is  not visualized. No aortic stenosis is present.   5. The inferior vena cava is normal in size with greater than 50%  respiratory variability, suggesting right atrial pressure of 3 mmHg.   Epic records are reviewed at length today  CHA2DS2-VASc Score = 5  The patient's score is based upon: CHF History: 0 HTN History: 1 Diabetes History: 1 Stroke History: 0 Vascular Disease History: 0 Age Score: 2 Gender Score: 1       ASSESSMENT AND PLAN: 1. Paroxysmal Atrial Fibrillation (ICD10:  I48.0) The patient's CHA2DS2-VASc score is 5, indicating a 7.2% annual risk of stroke.   Patient appears to be maintaining  SR.  Check cbc Continue Eliquis 5 mg BID Continue diltiazem 120 mg daily   2. Secondary Hypercoagulable State (ICD10:  D68.69) The patient is at significant risk for  stroke/thromboembolism based upon her CHA2DS2-VASc Score of 5.  Continue Apixaban (Eliquis).   3. Obesity Body mass index is 38.37 kg/m. Lifestyle modification was discussed and encouraged including regular physical activity and weight reduction.  4. Obstructive sleep apnea Referred for sleep study and to establish with sleep medicine.   5. HTN Stable, no changes today.   Follow up with Dr Irish Lack as scheduled. AF clinic in 6 months.    [facility] [address] [place], [place] [address] [phone] 03/04/2021 10:02 AM

## 2021-03-07 ENCOUNTER — Other Ambulatory Visit: Payer: Self-pay

## 2021-03-07 ENCOUNTER — Ambulatory Visit (INDEPENDENT_AMBULATORY_CARE_PROVIDER_SITE_OTHER): Payer: Medicare Other | Admitting: Interventional Cardiology

## 2021-03-07 ENCOUNTER — Encounter: Payer: Self-pay | Admitting: Interventional Cardiology

## 2021-03-07 VITALS — BP 132/74 | HR 51 | Ht 63.0 in | Wt 217.8 lb

## 2021-03-07 DIAGNOSIS — I480 Paroxysmal atrial fibrillation: Secondary | ICD-10-CM

## 2021-03-07 DIAGNOSIS — G4730 Sleep apnea, unspecified: Secondary | ICD-10-CM

## 2021-03-07 DIAGNOSIS — E1159 Type 2 diabetes mellitus with other circulatory complications: Secondary | ICD-10-CM

## 2021-03-07 DIAGNOSIS — I10 Essential (primary) hypertension: Secondary | ICD-10-CM

## 2021-03-07 DIAGNOSIS — Z79899 Other long term (current) drug therapy: Secondary | ICD-10-CM

## 2021-03-07 DIAGNOSIS — Z794 Long term (current) use of insulin: Secondary | ICD-10-CM

## 2021-03-07 MED ORDER — ROSUVASTATIN CALCIUM 20 MG PO TABS: 20.0000 mg | ORAL_TABLET | Freq: Every day | ORAL | 3 refills | Status: DC

## 2021-03-07 NOTE — Patient Instructions (Addendum)
Medication Instructions:  Your physician has recommended you make the following change in your medication: Start Rosuvastatin 20 mg by mouth daily  *If you need a refill on your cardiac medications before your next appointment, please call your pharmacy*   Lab Work: Your physician recommends that you return for lab work on June 05, 2021.  Lipid and liver profiles.  This will be fasting.  The lab opens at 7:30 AM  If you have labs (blood work) drawn today and your tests are completely normal, you will receive your results only by: [place] (if you have MyChart) OR A paper copy in the mail If you have any lab test that is abnormal or we need to change your treatment, we will call you to review the results.   Testing/Procedures: none   Follow-Up: At [facility], you and your health needs are our priority.  As part of our continuing mission to provide you with exceptional heart care, we have created designated Provider Care Teams.  These Care Teams include your primary Cardiologist (physician) and Advanced Practice Providers (APPs -  Physician Assistants and Nurse Practitioners) who all work together to provide you with the care you need, when you need it.  We recommend signing up for the patient portal called "MyChart".  Sign up information is provided on this After Visit Summary.  MyChart is used to connect with patients for Virtual Visits (Telemedicine).  Patients are able to view lab/test results, encounter notes, upcoming appointments, etc.  Non-urgent messages can be sent to your provider as well.   To learn more about what you can do with MyChart, go to [email or web address].    Your next appointment:   12 month(s)  The format for your next appointment:   In Person  Provider:   Larae Grooms, MD     Other Instructions  High-Fiber Eating Plan Fiber, also called dietary fiber, is a type of carbohydrate. It is found foods such as fruits, vegetables, whole grains,  and beans. A high-fiber diet can have many health benefits. Your health care provider may recommend a high-fiber diet to help: Prevent constipation. Fiber can make your bowel movements more regular. Lower your cholesterol. Relieve the following conditions: Inflammation of veins in the anus (hemorrhoids). Inflammation of specific areas of the digestive tract (uncomplicated diverticulosis). A problem of the large intestine, also called the colon, that sometimes causes pain and diarrhea (irritable bowel syndrome, or IBS). Prevent overeating as part of a weight-loss plan. Prevent heart disease, type 2 diabetes, and certain cancers. What are tips for following this plan? Reading food labels  Check the nutrition facts label on food products for the amount of dietary fiber. Choose foods that have 5 grams of fiber or more per serving. The goals for recommended daily fiber intake include: Men (age 62 or younger): 34-38 g. Men (over age 28): 28-34 g. Women (age 81 or younger): 25-28 g. Women (over age 83): 22-25 g. Your daily fiber goal is _____________ g. Shopping Choose whole fruits and vegetables instead of processed forms, such as apple juice or applesauce. Choose a wide variety of high-fiber foods such as avocados, lentils, oats, and kidney beans. Read the nutrition facts label of the foods you choose. Be aware of foods with added fiber. These foods often have high sugar and sodium amounts per serving. Cooking Use whole-grain flour for baking and cooking. Cook with brown rice instead of white rice. Meal planning Start the day with a breakfast that is high in fiber,  such as a cereal that contains 5 g of fiber or more per serving. Eat breads and cereals that are made with whole-grain flour instead of refined flour or white flour. Eat brown rice, bulgur wheat, or millet instead of white rice. Use beans in place of meat in soups, salads, and pasta dishes. Be sure that half of the grains you eat  each day are whole grains. General information You can get the recommended daily intake of dietary fiber by: Eating a variety of fruits, vegetables, grains, nuts, and beans. Taking a fiber supplement if you are not able to take in enough fiber in your diet. It is better to get fiber through food than from a supplement. Gradually increase how much fiber you consume. If you increase your intake of dietary fiber too quickly, you may have bloating, cramping, or gas. Drink plenty of water to help you digest fiber. Choose high-fiber snacks, such as berries, raw vegetables, nuts, and popcorn. What foods should I eat? Fruits Berries. Pears. Apples. Oranges. Avocado. Prunes and raisins. Dried figs. Vegetables Sweet potatoes. Spinach. Kale. Artichokes. Cabbage. Broccoli. Cauliflower. Green peas. Carrots. Squash. Grains Whole-grain breads. Multigrain cereal. Oats and oatmeal. Brown rice. Barley. Bulgur wheat. [place]. Quinoa. Bran muffins. Popcorn. Rye wafer crackers. Meats and other proteins Navy beans, kidney beans, and pinto beans. Soybeans. Split peas. Lentils. Nuts and seeds. Dairy Fiber-fortified yogurt. Beverages Fiber-fortified soy milk. Fiber-fortified orange juice. Other foods Fiber bars. The items listed above may not be a complete list of recommended foods and beverages. Contact a dietitian for more information. What foods should I avoid? Fruits Fruit juice. Cooked, strained fruit. Vegetables Fried potatoes. Canned vegetables. Well-cooked vegetables. Grains White bread. Pasta made with refined flour. White rice. Meats and other proteins Fatty cuts of meat. Fried chicken or fried fish. Dairy Milk. Yogurt. Cream cheese. Sour cream. Fats and oils Butters. Beverages Soft drinks. Other foods Cakes and pastries. The items listed above may not be a complete list of foods and beverages to avoid. Talk with your dietitian about what choices are best for you. Summary Fiber is a type  of carbohydrate. It is found in foods such as fruits, vegetables, whole grains, and beans. A high-fiber diet has many benefits. It can help to prevent constipation, lower blood cholesterol, aid weight loss, and reduce your risk of heart disease, diabetes, and certain cancers. Increase your intake of fiber gradually. Increasing fiber too quickly may cause cramping, bloating, and gas. Drink plenty of water while you increase the amount of fiber you consume. The best sources of fiber include whole fruits and vegetables, whole grains, nuts, seeds, and beans. This information is not intended to replace advice given to you by your health care provider. Make sure you discuss any questions you have with your health care provider. Document Revised: 07/20/2019 Document Reviewed: 07/20/2019 Elsevier Patient Education  2022 [employer].

## 2021-03-07 NOTE — Progress Notes (Signed)
Cardiology Office Note   Date:  03/07/2021   ID:  Jaime Boone, DOB 23-Jun-1940, MRN [id number]  PCP:  Prince Solian, MD    No chief complaint on file.  AFib, RF for CAD  Wt Readings from Last 3 Encounters:  03/07/21 217 lb 12.8 oz (98.8 kg)  03/04/21 216 lb 9.6 oz (98.2 kg)  01/20/21 218 lb 9.6 oz (99.2 kg)       History of Present Illness: Jaime Boone is a 80 y.o. female who is being seen today for the evaluation of AFib at the request of Fenton, Clint R, PA.   Records show she has: a history of DM, HTN, OSA, asthma, hep C, atrial fibrillation who presents for consultation in the [facility].  The patient was initially diagnosed with atrial fibrillation 01/13/21 after presenting to the ED with symptoms of shakiness, lightheadedness, and palpitations. EMS was called and ECG showed afib with RVR. She was started on a diltiazem drip in the ED and converted to SR. Patient was started on Eliquis for a CHADS2VASC score of 5 and diltiazem for rate control. Patient reports that she has had intermittent episodes of shakiness and lightheadedness since leaving the ED. She checked her blood glucose during these episodes and it was low. She ate something sweet and her symptoms resolved. She denies any bleeding issues on anticoagulation. She was diagnosed with OSA remotely but never started CPAP."  She has ben doing better with Diltiazem and Eliquis.  No bleeding problems.   No regular exercise.   Denies : Chest pain. Dizziness. Leg edema. Nitroglycerin use. Orthopnea. Paroxysmal nocturnal dyspnea. Shortness of breath. Syncope.    Past Medical History:  Diagnosis Date   Acid reflux    Acute bilateral knee pain    Arthritis    Asthma    Asthma    Atrial fibrillation (HCC)    Chest pain    Cirrhosis ([place])    Connective tissue disease ([place])    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   Degenerative joint disease    Diabetes mellitus without complication  (HCC)    DJD (degenerative joint disease)    DM (diabetes mellitus) (HCC)    Fatigue    Fatty liver    GERD (gastroesophageal reflux disease)    Hepatitis C    History of hepatitis C    Hypertension    Jaw pain    OSA (obstructive sleep apnea)    Sleep apnea    Tiredness     Past Surgical History:  Procedure Laterality Date   ABDOMINAL HYSTERECTOMY     TAH   APPENDECTOMY     DILATION AND CURETTAGE OF UTERUS     EYE SURGERY Left    CATARACT   FOOT SURGERY Bilateral    BUNIONECTOMY   GALLBLADDER SURGERY     keiloid removal      ear   NOSE SURGERY     oral stone extraction       Current Outpatient Medications  Medication Sig Dispense Refill   apixaban (ELIQUIS) 5 MG TABS tablet Take 1 tablet (5 mg total) by mouth 2 (two) times daily. 60 tablet 0   Ascorbic Acid (VITAMIN C PO) Take by mouth.     B-D ULTRAFINE III SHORT PEN 31G X 8 MM MISC SMARTSIG:2 Needle SUB-Q Daily     Calcium Carbonate-Vitamin D 600-400 MG-UNIT tablet Take 1 tablet by mouth daily.     cetirizine (ZYRTEC) 10 MG tablet Take 10  mg by mouth daily after breakfast.     diltiazem (CARDIZEM SR) 60 MG 12 hr capsule Take 1 capsule (60 mg total) by mouth daily. 30 capsule -   fluticasone furoate-vilanterol (BREO ELLIPTA) 100-25 MCG/INH AEPB 1 puff by mouth daily     glipiZIDE (GLUCOTROL) 5 MG tablet TAKE 2 TABLETS BY MOUTH EVERY MORNING AND 1 TABLET EVERY EVENING     Lancets (ONETOUCH DELICA PLUS [id number]) MISC U TO TEST BS BID     LANTUS SOLOSTAR 100 UNIT/ML Solostar Pen SMARTSIG:35 Unit(s) SUB-Q Daily     levothyroxine (SYNTHROID) 75 MCG tablet Take 75 mcg by mouth every morning.     losartan (COZAAR) 50 MG tablet Take 25 mg by mouth 2 (two) times daily.     montelukast (SINGULAIR) 10 MG tablet Take 10 mg by mouth daily after breakfast.      Multiple Vitamin (MULTIVITAMIN) capsule Take 1 capsule by mouth daily.     Multiple Vitamins-Minerals (DAILY MULTIVITAMIN) CAPS one capsule PO daily     ONE TOUCH ULTRA  TEST test strip      pantoprazole (PROTONIX) 40 MG tablet Take 40 mg by mouth 2 (two) times daily.      PROAIR HFA 108 (90 BASE) MCG/ACT inhaler Inhale 2 puffs into the lungs every 6 (six) hours as needed for wheezing or shortness of breath.      psyllium (METAMUCIL) 58.6 % powder Taking 1 tsp by mouth twice daily     triamcinolone cream (KENALOG) 0.1 % Apply 1 application topically daily as needed (skin irritation).      prednisoLONE acetate (PRED FORTE) 1 % ophthalmic suspension Place 1 drop into the right eye 4 (four) times daily. (Patient not taking: Reported on 03/07/2021)     tobramycin (TOBREX) 0.3 % ophthalmic solution Place 1 drop into the right eye 4 (four) times daily. (Patient not taking: Reported on 03/07/2021)     No current facility-administered medications for this visit.    Allergies:   Aspirin, Ciprofloxacin, Codeine, Duloxetine hcl, Nitrofuran derivatives, and Penicillins    Social History:  The patient  reports that she quit smoking about 47 years ago. Her smoking use included cigarettes. She has never used smokeless tobacco. She reports current alcohol use of about 2.0 standard drinks per week. She reports that she does not use drugs.   Family History:  The patient's family history includes Diabetes in her father, mother, and sister; Heart disease in her brother and father; Hypertension in her father and mother; Lung cancer in her cousin; Prostate cancer in her brother and father.    ROS:  Please see the history of present illness.   Otherwise, review of systems are positive for palpitations, decreasing.   All other systems are reviewed and negative.    PHYSICAL EXAM: VS:  BP 132/74 (BP Location: Right Arm, Patient Position: Sitting, Cuff Size: Normal)   Pulse (!) 51   Ht 5\' 3"  (1.6 m)   Wt 217 lb 12.8 oz (98.8 kg)   SpO2 99%   BMI 38.58 kg/m  , BMI Body mass index is 38.58 kg/m. GEN: Well nourished, well developed, in no acute distress HEENT: normal Neck: no JVD,  carotid bruits, or masses Cardiac: RRR; no murmurs, rubs, or gallops,no edema  Respiratory:  clear to auscultation bilaterally, normal work of breathing GI: soft, nontender, nondistended, + BS MS: no deformity or atrophy Skin: warm and dry, no rash Neuro:  Strength and sensation are intact Psych: euthymic mood, full affect  EKG:   The ekg ordered 12/6 demonstrates NSR, nonspecific ST-T wave changes   Recent Labs: 01/13/2021: ALT 24; Magnesium 1.6; TSH 4.922 03/04/2021: BUN 7; Creatinine, Ser 0.96; Hemoglobin 11.2; Platelets 302; Potassium 4.5; Sodium 135   Lipid Panel No results found for: CHOL, TRIG, HDL, CHOLHDL, VLDL, LDLCALC, LDLDIRECT   Other studies Reviewed: Additional studies/ records that were reviewed today with results demonstrating: labs reviewed, LDL 118.   ASSESSMENT AND PLAN:  AFib: Maintaining NSR. Eliquis for stroke prevention.  Former smoker: Quit smoking in 1975. RF for CAD including HTN, DM.  A1C 8.4.  BP well controlled. Increase exercise.  Whole food, plant based diet.  High fiber diet.  Avoid processed.   Morbid obesity: We spoke about exercise and diet to improve A1C.  Current long-term goal is to lower her cardiovascular risk.  Concentrating on preventive therapy.  Weight loss will also help reduce the amount of medication she needs. OSA: not using CPAP.  She is going to see sleep medicine.  Hyperlipidemia: LDL 118. Start rosuvastatin 20 mg daily.  Lipids and liver tests in 3 months. High intensity statin is indicated.   Current medicines are reviewed at length with the patient today.  The patient concerns regarding her medicines were addressed.  The following changes have been made:  No change  Labs/ tests ordered today include:  No orders of the defined types were placed in this encounter.   Recommend 150 minutes/week of aerobic exercise Low fat, low carb, high fiber diet recommended  Disposition:   FU in 1 year   Signed, Larae Grooms,  MD  03/07/2021 8:53 AM    [facility] [address], [place], [place]  [address] Phone: [phone]; Fax: [phone]

## 2021-06-05 ENCOUNTER — Other Ambulatory Visit: Payer: Medicare Other

## 2021-06-27 ENCOUNTER — Telehealth: Payer: Self-pay | Admitting: *Deleted

## 2021-06-27 NOTE — Telephone Encounter (Signed)
Prior Authorization for SPLIT NIGHT sent to [facility] via web portal.  Prior Authorization is not required for the requested services ? ?Decision ID #:[id number] ?

## 2021-09-03 ENCOUNTER — Ambulatory Visit (HOSPITAL_COMMUNITY)
Admission: RE | Admit: 2021-09-03 | Discharge: 2021-09-03 | Disposition: A | Discharge status: Home / Self Care | Payer: Medicare Other | Source: Ambulatory Visit | Attending: Physician Assistant | Admitting: Physician Assistant

## 2021-09-03 VITALS — BP 138/82 | HR 71 | Ht 63.0 in | Wt 210.8 lb

## 2021-09-03 DIAGNOSIS — J45909 Unspecified asthma, uncomplicated: Secondary | ICD-10-CM | POA: Insufficient documentation

## 2021-09-03 DIAGNOSIS — Z8619 Personal history of other infectious and parasitic diseases: Secondary | ICD-10-CM | POA: Diagnosis not present

## 2021-09-03 DIAGNOSIS — E119 Type 2 diabetes mellitus without complications: Secondary | ICD-10-CM | POA: Diagnosis not present

## 2021-09-03 DIAGNOSIS — G4733 Obstructive sleep apnea (adult) (pediatric): Secondary | ICD-10-CM | POA: Insufficient documentation

## 2021-09-03 DIAGNOSIS — D6869 Other thrombophilia: Secondary | ICD-10-CM | POA: Diagnosis not present

## 2021-09-03 DIAGNOSIS — I480 Paroxysmal atrial fibrillation: Secondary | ICD-10-CM | POA: Diagnosis not present

## 2021-09-03 MED ORDER — DILTIAZEM HCL ER COATED BEADS 120 MG PO CP24: 120.0000 mg | ORAL_CAPSULE | Freq: Every day | ORAL | 3 refills | Status: DC

## 2021-09-03 NOTE — Patient Instructions (Signed)
Increase cardizem to '120mg'$  once a day

## 2021-09-03 NOTE — Progress Notes (Signed)
Primary Care Physician: Prince Solian, MD Primary Cardiologist: Dr Irish Lack Primary Electrophysiologist: none Referring Physician: [facility]   Jaime Boone is a 81 y.o. female with a history of DM, HTN, OSA, asthma, hep C, atrial fibrillation who presents for follow up in the [facility].  The patient was initially diagnosed with atrial fibrillation 01/13/21 after presenting to the ED with symptoms of shakiness, lightheadedness, and palpitations. EMS was called and ECG showed afib with RVR. She was started on a diltiazem drip in the ED and converted to SR. Patient was started on Eliquis for a CHADS2VASC score of 5 and diltiazem for rate control. Patient reports that she has had intermittent episodes of shakiness and lightheadedness since leaving the ED. She checked her blood glucose during these episodes and it was low. She ate something sweet and her symptoms resolved. She was diagnosed with OSA remotely but never started CPAP.  On follow up today, patient reports that recently she has had more palpitations. The episodes do not last long (1-2 minutes) but they are happening almost daily. There are no specific triggers that she can identify. She has not had a sleep study.   Today, she denies symptoms of chest pain, shortness of breath, orthopnea, PND, lower extremity edema, presyncope, syncope, bleeding, or neurologic sequela. The patient is tolerating medications without difficulties and is otherwise without complaint today.    Atrial Fibrillation Risk Factors:  she does have symptoms or diagnosis of sleep apnea. she is not compliant with CPAP therapy. she does not have a history of rheumatic fever. she does not have a history of alcohol use.   she has a BMI of Body mass index is 37.34 kg/m.Marland Kitchen Filed Weights   09/03/21 0923  Weight: 95.6 kg    Family History  Problem Relation Age of Onset   Hypertension Mother    Diabetes Mother    Hypertension  Father    Diabetes Father    Heart disease Father    Prostate cancer Father    Diabetes Sister    Heart disease Brother    Prostate cancer Brother    Lung cancer Cousin    Colon cancer Neg Hx    Stomach cancer Neg Hx    Esophageal cancer Neg Hx    Pancreatic cancer Neg Hx    Liver disease Neg Hx      Atrial Fibrillation Management history:  Previous antiarrhythmic drugs: none Previous cardioversions: none Previous ablations: none CHADS2VASC score: 5 Anticoagulation history: Eliquis   Past Medical History:  Diagnosis Date   Acid reflux    Acute bilateral knee pain    Arthritis    Asthma    Asthma    Atrial fibrillation (HCC)    Chest pain    Cirrhosis ([place])    Connective tissue disease ([place])    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   Degenerative joint disease    Diabetes mellitus without complication (HCC)    DJD (degenerative joint disease)    DM (diabetes mellitus) ([place])    Fatigue    Fatty liver    GERD (gastroesophageal reflux disease)    Hepatitis C    History of hepatitis C    Hypertension    Jaw pain    OSA (obstructive sleep apnea)    Sleep apnea    Tiredness    Past Surgical History:  Procedure Laterality Date   ABDOMINAL HYSTERECTOMY     TAH   APPENDECTOMY     DILATION  AND CURETTAGE OF UTERUS     EYE SURGERY Left    CATARACT   FOOT SURGERY Bilateral    BUNIONECTOMY   GALLBLADDER SURGERY     keiloid removal      ear   NOSE SURGERY     oral stone extraction      Current Outpatient Medications  Medication Sig Dispense Refill   apixaban (ELIQUIS) 5 MG TABS tablet Take 1 tablet (5 mg total) by mouth 2 (two) times daily. 60 tablet 0   Ascorbic Acid (VITAMIN C PO) Take 1 tablet by mouth as needed.     B-D ULTRAFINE III SHORT PEN 31G X 8 MM MISC SMARTSIG:2 Needle SUB-Q Daily     Calcium Carbonate-Vitamin D 600-400 MG-UNIT tablet Take 1 tablet by mouth daily.     cetirizine (ZYRTEC) 10 MG tablet Take 10 mg by mouth daily after breakfast.      diltiazem (CARDIZEM SR) 60 MG 12 hr capsule Take 1 capsule (60 mg total) by mouth daily. 30 capsule -   fluticasone furoate-vilanterol (BREO ELLIPTA) 100-25 MCG/INH AEPB 1 puff by mouth daily     glipiZIDE (GLUCOTROL) 5 MG tablet TAKE 2 TABLETS BY MOUTH EVERY MORNING AND 1 TABLET EVERY EVENING     Lancets (ONETOUCH DELICA PLUS [id number]) MISC U TO TEST BS BID     LANTUS SOLOSTAR 100 UNIT/ML Solostar Pen SMARTSIG:35 Unit(s) SUB-Q Daily     levothyroxine (SYNTHROID) 75 MCG tablet Take 75 mcg by mouth every morning.     losartan (COZAAR) 50 MG tablet Take 25 mg by mouth 2 (two) times daily.     montelukast (SINGULAIR) 10 MG tablet Take 10 mg by mouth daily after breakfast.      Multiple Vitamin (MULTIVITAMIN) capsule Take 1 capsule by mouth daily.     ONE TOUCH ULTRA TEST test strip      pantoprazole (PROTONIX) 40 MG tablet Take 40 mg by mouth 2 (two) times daily.      PROAIR HFA 108 (90 BASE) MCG/ACT inhaler Inhale 2 puffs into the lungs every 6 (six) hours as needed for wheezing or shortness of breath.      psyllium (METAMUCIL) 58.6 % powder Taking 1 tsp by mouth twice daily     rosuvastatin (CRESTOR) 20 MG tablet Take 1 tablet (20 mg total) by mouth daily. 90 tablet 3   triamcinolone cream (KENALOG) 0.1 % Apply 1 application topically daily as needed (skin irritation).      No current facility-administered medications for this encounter.    Allergies  Allergen Reactions   Aspirin Other (See Comments)    Stomach pain   Ciprofloxacin Nausea And Vomiting   Codeine Nausea And Vomiting   Duloxetine Hcl Nausea And Vomiting   Nitrofuran Derivatives    Penicillins Swelling    THROAT Has patient had a PCN reaction causing immediate rash, facial/tongue/throat swelling, SOB or lightheadedness with hypotension: yes Has patient had a PCN reaction causing severe rash involving mucus membranes or skin necrosis: no Has patient had a PCN reaction that required hospitalization: no Has patient had a PCN  reaction occurring within the last 10 years: no If all of the above answers are "NO", then may proceed with Cephalosporin use.     Social History   Socioeconomic History   Marital status: Divorced    Spouse name: Not on file   Number of children: 3   Years of education: Not on file   Highest education level: Not on file  Occupational  History   Occupation: Retired    Fish farm manager: RETIRED  Tobacco Use   Smoking status: Former    Types: Cigarettes    Quit date: 03/01/1974    Years since quitting: 47.5   Smokeless tobacco: Never   Tobacco comments:    Former smoker 03/04/2021  Vaping Use   Vaping Use: Never used  Substance and Sexual Activity   Alcohol use: Yes    Alcohol/week: 2.0 standard drinks    Types: 2 Standard drinks or equivalent per week    Comment: rare   Drug use: No   Sexual activity: Never  Other Topics Concern   Not on file  Social History Narrative   Not on file   Social Determinants of Health   Financial Resource Strain: Not on file  Food Insecurity: Not on file  Transportation Needs: Not on file  Physical Activity: Not on file  Stress: Not on file  Social Connections: Not on file  Intimate Partner Violence: Not on file     ROS- All systems are reviewed and negative except as per the HPI above.  Physical Exam: Vitals:   09/03/21 0923  BP: 138/82  Pulse: 71  Weight: 95.6 kg  Height: '5\' 3"'$  (1.6 m)     GEN- The patient is a well appearing elderly obese female, alert and oriented x 3 today.   HEENT-head normocephalic, atraumatic, sclera clear, conjunctiva pink, hearing intact, trachea midline. Lungs- Clear to ausculation bilaterally, normal work of breathing Heart- Regular rate and rhythm, no murmurs, rubs or gallops  GI- soft, NT, ND, + BS Extremities- no clubbing, cyanosis, or edema MS- no significant deformity or atrophy Skin- no rash or lesion Psych- euthymic mood, full affect Neuro- strength and sensation are intact   Wt Readings  from Last 3 Encounters:  09/03/21 95.6 kg  03/07/21 98.8 kg  03/04/21 98.2 kg    EKG today demonstrates  SR, NST Vent. rate 71 BPM PR interval 146 ms QRS duration 66 ms QT/QTcB 364/395 ms  Echo 02/28/21 demonstrated  1. Left ventricular ejection fraction, by estimation, is 60 to 65%. The  left ventricle has normal function. The left ventricle has no regional  wall motion abnormalities. There is mild concentric left ventricular  hypertrophy. Left ventricular diastolic parameters are consistent with Grade I diastolic dysfunction (impaired relaxation).   2. Right ventricular systolic function is normal. The right ventricular  size is normal. There is normal pulmonary artery systolic pressure. The estimated right ventricular systolic pressure is 60.1 mmHg.   3. The mitral valve is normal in structure. Trivial mitral valve  regurgitation. No evidence of mitral stenosis.   4. The aortic valve is normal in structure. Aortic valve regurgitation is  not visualized. No aortic stenosis is present.   5. The inferior vena cava is normal in size with greater than 50%  respiratory variability, suggesting right atrial pressure of 3 mmHg.   Epic records are reviewed at length today  CHA2DS2-VASc Score = 5  The patient's score is based upon: CHF History: 0 HTN History: 1 Diabetes History: 1 Stroke History: 0 Vascular Disease History: 0 Age Score: 2 Gender Score: 1        ASSESSMENT AND PLAN: 1. Paroxysmal Atrial Fibrillation (ICD10:  I48.0) The patient's CHA2DS2-VASc score is 5, indicating a 7.2% annual risk of stroke.   Patient having more frequent palpitations. Continue Eliquis 5 mg BID Increase diltiazem to 120 mg daily (has been on 60 mg daily) Suspect related to untreated OSA.  2. Secondary Hypercoagulable State (ICD10:  D68.69) The patient is at significant risk for stroke/thromboembolism based upon her CHA2DS2-VASc Score of 5.  Continue Apixaban (Eliquis).   3. Obesity Body  mass index is 37.34 kg/m. Lifestyle modification was discussed and encouraged including regular physical activity and weight reduction.  4. Obstructive sleep apnea Referred to sleep medicine but has not been scheduled for sleep study. Will follow up with referral.   5. HTN Stable, no changes today.   Follow up in the AF clinic in ~ 3 weeks.    [facility] [address] [place], [place] [address] [phone] 09/03/2021 9:50 AM

## 2021-09-24 ENCOUNTER — Encounter (HOSPITAL_COMMUNITY): Payer: Self-pay | Admitting: Physician Assistant

## 2021-09-24 ENCOUNTER — Ambulatory Visit (HOSPITAL_COMMUNITY)
Admission: RE | Admit: 2021-09-24 | Discharge: 2021-09-24 | Disposition: A | Discharge status: Home / Self Care | Payer: Medicare Other | Source: Ambulatory Visit | Attending: Physician Assistant | Admitting: Physician Assistant

## 2021-09-24 VITALS — BP 136/82 | HR 75 | Ht 63.0 in | Wt 212.0 lb

## 2021-09-24 DIAGNOSIS — E119 Type 2 diabetes mellitus without complications: Secondary | ICD-10-CM | POA: Diagnosis not present

## 2021-09-24 DIAGNOSIS — Z6837 Body mass index (BMI) 37.0-37.9, adult: Secondary | ICD-10-CM | POA: Diagnosis not present

## 2021-09-24 DIAGNOSIS — I480 Paroxysmal atrial fibrillation: Secondary | ICD-10-CM | POA: Insufficient documentation

## 2021-09-24 DIAGNOSIS — I10 Essential (primary) hypertension: Secondary | ICD-10-CM | POA: Diagnosis not present

## 2021-09-24 DIAGNOSIS — D6869 Other thrombophilia: Secondary | ICD-10-CM

## 2021-09-24 DIAGNOSIS — B1920 Unspecified viral hepatitis C without hepatic coma: Secondary | ICD-10-CM | POA: Insufficient documentation

## 2021-09-24 DIAGNOSIS — J45909 Unspecified asthma, uncomplicated: Secondary | ICD-10-CM | POA: Insufficient documentation

## 2021-09-24 DIAGNOSIS — Z7901 Long term (current) use of anticoagulants: Secondary | ICD-10-CM | POA: Diagnosis not present

## 2021-09-24 DIAGNOSIS — G4733 Obstructive sleep apnea (adult) (pediatric): Secondary | ICD-10-CM | POA: Insufficient documentation

## 2021-09-24 DIAGNOSIS — E669 Obesity, unspecified: Secondary | ICD-10-CM | POA: Diagnosis not present

## 2021-09-24 NOTE — Progress Notes (Signed)
Primary Care Physician: Prince Solian, MD Primary Cardiologist: Dr Irish Lack Primary Electrophysiologist: none Referring Physician: [facility]   Jaime Boone is a 81 y.o. female with a history of DM, HTN, OSA, asthma, hep C, atrial fibrillation who presents for follow up in the [facility].  The patient was initially diagnosed with atrial fibrillation 01/13/21 after presenting to the ED with symptoms of shakiness, lightheadedness, and palpitations. EMS was called and ECG showed afib with RVR. She was started on a diltiazem drip in the ED and converted to SR. Patient was started on Eliquis for a CHADS2VASC score of 5 and diltiazem for rate control. Patient reports that she has had intermittent episodes of shakiness and lightheadedness since leaving the ED. She checked her blood glucose during these episodes and it was low. She ate something sweet and her symptoms resolved. She was diagnosed with OSA remotely but never started CPAP.  On follow up today, patient reports that she has done well since her last visit. Since increasing diltiazem, her palpitations have decreased from several times per day to just 3 brief episodes in the past 3 weeks. No bleeding issues on anticoagulation.   Today, she denies symptoms of palpitations, chest pain, shortness of breath, orthopnea, PND, lower extremity edema, presyncope, syncope, bleeding, or neurologic sequela. The patient is tolerating medications without difficulties and is otherwise without complaint today.    Atrial Fibrillation Risk Factors:  she does have symptoms or diagnosis of sleep apnea. she is not compliant with CPAP therapy. she does not have a history of rheumatic fever. she does not have a history of alcohol use.   she has a BMI of Body mass index is 37.55 kg/m.Marland Kitchen Filed Weights   09/24/21 0945  Weight: 96.2 kg     Family History  Problem Relation Age of Onset   Hypertension Mother    Diabetes  Mother    Hypertension Father    Diabetes Father    Heart disease Father    Prostate cancer Father    Diabetes Sister    Heart disease Brother    Prostate cancer Brother    Lung cancer Cousin    Colon cancer Neg Hx    Stomach cancer Neg Hx    Esophageal cancer Neg Hx    Pancreatic cancer Neg Hx    Liver disease Neg Hx      Atrial Fibrillation Management history:  Previous antiarrhythmic drugs: none Previous cardioversions: none Previous ablations: none CHADS2VASC score: 5 Anticoagulation history: Eliquis   Past Medical History:  Diagnosis Date   Acid reflux    Acute bilateral knee pain    Arthritis    Asthma    Asthma    Atrial fibrillation (HCC)    Chest pain    Cirrhosis ([place])    Connective tissue disease ([place])    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   Degenerative joint disease    Diabetes mellitus without complication (HCC)    DJD (degenerative joint disease)    DM (diabetes mellitus) ([place])    Fatigue    Fatty liver    GERD (gastroesophageal reflux disease)    Hepatitis C    History of hepatitis C    Hypertension    Jaw pain    OSA (obstructive sleep apnea)    Sleep apnea    Tiredness    Past Surgical History:  Procedure Laterality Date   ABDOMINAL HYSTERECTOMY     TAH   APPENDECTOMY  DILATION AND CURETTAGE OF UTERUS     EYE SURGERY Left    CATARACT   FOOT SURGERY Bilateral    BUNIONECTOMY   GALLBLADDER SURGERY     keiloid removal      ear   NOSE SURGERY     oral stone extraction      Current Outpatient Medications  Medication Sig Dispense Refill   apixaban (ELIQUIS) 5 MG TABS tablet Take 1 tablet (5 mg total) by mouth 2 (two) times daily. 60 tablet 0   Ascorbic Acid (VITAMIN C PO) Take 1 tablet by mouth as needed.     B-D ULTRAFINE III SHORT PEN 31G X 8 MM MISC SMARTSIG:2 Needle SUB-Q Daily     Calcium Carbonate-Vitamin D 600-400 MG-UNIT tablet Take 1 tablet by mouth daily.     cetirizine (ZYRTEC) 10 MG tablet Take 10 mg by mouth daily  after breakfast.     diltiazem (CARDIZEM CD) 120 MG 24 hr capsule Take 1 capsule (120 mg total) by mouth daily. 30 capsule 3   fluticasone furoate-vilanterol (BREO ELLIPTA) 100-25 MCG/INH AEPB 1 puff by mouth daily     glipiZIDE (GLUCOTROL) 5 MG tablet TAKE 2 TABLETS BY MOUTH EVERY MORNING AND 1 TABLET EVERY EVENING     Lancets (ONETOUCH DELICA PLUS [id number]) MISC U TO TEST BS BID     LANTUS SOLOSTAR 100 UNIT/ML Solostar Pen SMARTSIG:35 Unit(s) SUB-Q Daily     levothyroxine (SYNTHROID) 75 MCG tablet Take 75 mcg by mouth every morning.     losartan (COZAAR) 50 MG tablet Take 25 mg by mouth 2 (two) times daily.     montelukast (SINGULAIR) 10 MG tablet Take 10 mg by mouth daily after breakfast.      Multiple Vitamin (MULTIVITAMIN) capsule Take 1 capsule by mouth daily.     ONE TOUCH ULTRA TEST test strip      pantoprazole (PROTONIX) 40 MG tablet Take 40 mg by mouth 2 (two) times daily.      PROAIR HFA 108 (90 BASE) MCG/ACT inhaler Inhale 2 puffs into the lungs every 6 (six) hours as needed for wheezing or shortness of breath.      psyllium (METAMUCIL) 58.6 % powder Taking 1 tsp by mouth twice daily     rosuvastatin (CRESTOR) 20 MG tablet Take 1 tablet (20 mg total) by mouth daily. 90 tablet 3   triamcinolone cream (KENALOG) 0.1 % Apply 1 application topically daily as needed (skin irritation).      No current facility-administered medications for this encounter.    Allergies  Allergen Reactions   Aspirin Other (See Comments)    Stomach pain   Ciprofloxacin Nausea And Vomiting   Codeine Nausea And Vomiting   Duloxetine Hcl Nausea And Vomiting   Nitrofuran Derivatives    Penicillins Swelling    THROAT Has patient had a PCN reaction causing immediate rash, facial/tongue/throat swelling, SOB or lightheadedness with hypotension: yes Has patient had a PCN reaction causing severe rash involving mucus membranes or skin necrosis: no Has patient had a PCN reaction that required hospitalization:  no Has patient had a PCN reaction occurring within the last 10 years: no If all of the above answers are "NO", then may proceed with Cephalosporin use.     Social History   Socioeconomic History   Marital status: Divorced    Spouse name: Not on file   Number of children: 3   Years of education: Not on file   Highest education level: Not on file  Occupational History   Occupation: Retired    Fish farm manager: RETIRED  Tobacco Use   Smoking status: Former    Types: Cigarettes    Quit date: 03/01/1974    Years since quitting: 47.6   Smokeless tobacco: Never   Tobacco comments:    Former smoker 03/04/2021  Vaping Use   Vaping Use: Never used  Substance and Sexual Activity   Alcohol use: Yes    Alcohol/week: 2.0 standard drinks of alcohol    Types: 2 Standard drinks or equivalent per week    Comment: rare   Drug use: No   Sexual activity: Never  Other Topics Concern   Not on file  Social History Narrative   Not on file   Social Determinants of Health   Financial Resource Strain: Not on file  Food Insecurity: Not on file  Transportation Needs: Not on file  Physical Activity: Not on file  Stress: Not on file  Social Connections: Not on file  Intimate Partner Violence: Not on file     ROS- All systems are reviewed and negative except as per the HPI above.  Physical Exam: Vitals:   09/24/21 0945  BP: 136/82  Pulse: 75  Weight: 96.2 kg  Height: '5\' 3"'$  (1.6 m)     GEN- The patient is a well appearing obese elderly female, alert and oriented x 3 today.   HEENT-head normocephalic, atraumatic, sclera clear, conjunctiva pink, hearing intact, trachea midline. Lungs- Clear to ausculation bilaterally, normal work of breathing Heart- Regular rate and rhythm, no murmurs, rubs or gallops  GI- soft, NT, ND, + BS Extremities- no clubbing, cyanosis, or edema MS- no significant deformity or atrophy Skin- no rash or lesion Psych- euthymic mood, full affect Neuro- strength and  sensation are intact   Wt Readings from Last 3 Encounters:  09/24/21 96.2 kg  09/03/21 95.6 kg  03/07/21 98.8 kg    EKG today demonstrates  SR, NST Vent. rate 75 BPM PR interval 156 ms QRS duration 70 ms QT/QTcB 288/321 ms  Echo 02/28/21 demonstrated  1. Left ventricular ejection fraction, by estimation, is 60 to 65%. The  left ventricle has normal function. The left ventricle has no regional  wall motion abnormalities. There is mild concentric left ventricular  hypertrophy. Left ventricular diastolic parameters are consistent with Grade I diastolic dysfunction (impaired relaxation).   2. Right ventricular systolic function is normal. The right ventricular  size is normal. There is normal pulmonary artery systolic pressure. The estimated right ventricular systolic pressure is 95.1 mmHg.   3. The mitral valve is normal in structure. Trivial mitral valve  regurgitation. No evidence of mitral stenosis.   4. The aortic valve is normal in structure. Aortic valve regurgitation is  not visualized. No aortic stenosis is present.   5. The inferior vena cava is normal in size with greater than 50%  respiratory variability, suggesting right atrial pressure of 3 mmHg.   Epic records are reviewed at length today  CHA2DS2-VASc Score = 5  The patient's score is based upon: CHF History: 0 HTN History: 1 Diabetes History: 1 Stroke History: 0 Vascular Disease History: 0 Age Score: 2 Gender Score: 1       ASSESSMENT AND PLAN: 1. Paroxysmal Atrial Fibrillation (ICD10:  I48.0) The patient's CHA2DS2-VASc score is 5, indicating a 7.2% annual risk of stroke.   Patient's symptoms much improved on higher dose of diltiazem. Offered to increase further, patient would like to keep present dose for now.  Continue Eliquis 5  mg BID Continue diltiazem 120 mg daily  Suspect related to untreated OSA.  2. Secondary Hypercoagulable State (ICD10:  D68.69) The patient is at significant risk for  stroke/thromboembolism based upon her CHA2DS2-VASc Score of 5.  Continue Apixaban (Eliquis).   3. Obesity Body mass index is 37.55 kg/m. Lifestyle modification was discussed and encouraged including regular physical activity and weight reduction.  4. Obstructive sleep apnea Referred to sleep medicine but has not been scheduled for sleep study. Number to sleep center given to patient today.   5. HTN Stable, no changes today.   Follow up in the AF clinic in 3 months.    [facility] [address] [place], [place] [address] [phone] 09/24/2021 9:55 AM

## 2021-12-23 NOTE — Progress Notes (Signed)
Primary Care Physician: Prince Solian, MD Primary Cardiologist: Dr Irish Lack Primary Electrophysiologist: none Referring Physician: [facility]   Jaime Boone is a 81 y.o. female with a history of DM, HTN, OSA, asthma, hep C, atrial fibrillation who presents for follow up in the [facility].  The patient was initially diagnosed with atrial fibrillation 01/13/21 after presenting to the ED with symptoms of shakiness, lightheadedness, and palpitations. EMS was called and ECG showed afib with RVR. She was started on a diltiazem drip in the ED and converted to SR. Patient was started on Eliquis for a CHADS2VASC score of 5 and diltiazem for rate control. Patient reports that she has had intermittent episodes of shakiness and lightheadedness since leaving the ED. She checked her blood glucose during these episodes and it was low. She ate something sweet and her symptoms resolved. She was diagnosed with OSA remotely but never started CPAP.  On follow up today, patient reports that she has nocturnal palpitations about twice per week which last for several minutes before resolving. She has been trying to be more active by walking and using the stairs instead of the elevator. No bleeding issues on anticoagulation.   Today, she denies symptoms of chest pain, shortness of breath, orthopnea, PND, lower extremity edema, presyncope, syncope, bleeding, or neurologic sequela. The patient is tolerating medications without difficulties and is otherwise without complaint today.    Atrial Fibrillation Risk Factors:  she does have symptoms or diagnosis of sleep apnea. she is not compliant with CPAP therapy. she does not have a history of rheumatic fever. she does not have a history of alcohol use.   she has a BMI of Body mass index is 37.73 kg/m.Marland Kitchen Filed Weights   12/24/21 0835  Weight: 96.6 kg    Family History  Problem Relation Age of Onset   Hypertension Mother     Diabetes Mother    Hypertension Father    Diabetes Father    Heart disease Father    Prostate cancer Father    Diabetes Sister    Heart disease Brother    Prostate cancer Brother    Lung cancer Cousin    Colon cancer Neg Hx    Stomach cancer Neg Hx    Esophageal cancer Neg Hx    Pancreatic cancer Neg Hx    Liver disease Neg Hx      Atrial Fibrillation Management history:  Previous antiarrhythmic drugs: none Previous cardioversions: none Previous ablations: none CHADS2VASC score: 5 Anticoagulation history: Eliquis   Past Medical History:  Diagnosis Date   Acid reflux    Acute bilateral knee pain    Arthritis    Asthma    Asthma    Atrial fibrillation (HCC)    Chest pain    Cirrhosis ([place])    Connective tissue disease ([place])    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   Degenerative joint disease    Diabetes mellitus without complication (HCC)    DJD (degenerative joint disease)    DM (diabetes mellitus) ([place])    Fatigue    Fatty liver    GERD (gastroesophageal reflux disease)    Hepatitis C    History of hepatitis C    Hypertension    Jaw pain    OSA (obstructive sleep apnea)    Sleep apnea    Tiredness    Past Surgical History:  Procedure Laterality Date   ABDOMINAL HYSTERECTOMY     TAH   APPENDECTOMY  DILATION AND CURETTAGE OF UTERUS     EYE SURGERY Left    CATARACT   FOOT SURGERY Bilateral    BUNIONECTOMY   GALLBLADDER SURGERY     keiloid removal      ear   NOSE SURGERY     oral stone extraction      Current Outpatient Medications  Medication Sig Dispense Refill   apixaban (ELIQUIS) 5 MG TABS tablet Take 1 tablet (5 mg total) by mouth 2 (two) times daily. 60 tablet 0   Ascorbic Acid (VITAMIN C PO) Take 1 tablet by mouth as needed.     B-D ULTRAFINE III SHORT PEN 31G X 8 MM MISC SMARTSIG:2 Needle SUB-Q Daily     Calcium Carbonate-Vitamin D 600-400 MG-UNIT tablet Take 1 tablet by mouth daily.     cetirizine (ZYRTEC) 10 MG tablet Take 10 mg by  mouth daily after breakfast.     diltiazem (CARDIZEM CD) 120 MG 24 hr capsule Take 1 capsule (120 mg total) by mouth daily. 30 capsule 3   fluticasone furoate-vilanterol (BREO ELLIPTA) 100-25 MCG/INH AEPB 1 puff by mouth daily     glipiZIDE (GLUCOTROL) 5 MG tablet TAKE 2 TABLETS BY MOUTH EVERY MORNING AND 1 TABLET EVERY EVENING     Lancets (ONETOUCH DELICA PLUS [id number]) MISC U TO TEST BS BID     LANTUS SOLOSTAR 100 UNIT/ML Solostar Pen SMARTSIG:35 Unit(s) SUB-Q Daily     levothyroxine (SYNTHROID) 75 MCG tablet Take 75 mcg by mouth every morning.     losartan (COZAAR) 50 MG tablet Take 25 mg by mouth 2 (two) times daily.     montelukast (SINGULAIR) 10 MG tablet Take 10 mg by mouth daily after breakfast.      Multiple Vitamin (MULTIVITAMIN) capsule Take 1 capsule by mouth daily.     ONE TOUCH ULTRA TEST test strip      pantoprazole (PROTONIX) 40 MG tablet Take 40 mg by mouth 2 (two) times daily.      PROAIR HFA 108 (90 BASE) MCG/ACT inhaler Inhale 2 puffs into the lungs every 6 (six) hours as needed for wheezing or shortness of breath.      psyllium (METAMUCIL) 58.6 % powder Taking 1 tsp by mouth twice daily     rosuvastatin (CRESTOR) 20 MG tablet Take 1 tablet (20 mg total) by mouth daily. 90 tablet 3   triamcinolone cream (KENALOG) 0.1 % Apply 1 application topically daily as needed (skin irritation).      No current facility-administered medications for this encounter.    Allergies  Allergen Reactions   Aspirin Other (See Comments)    Stomach pain   Ciprofloxacin Nausea And Vomiting   Codeine Nausea And Vomiting   Duloxetine Hcl Nausea And Vomiting   Nitrofuran Derivatives    Penicillins Swelling    THROAT Has patient had a PCN reaction causing immediate rash, facial/tongue/throat swelling, SOB or lightheadedness with hypotension: yes Has patient had a PCN reaction causing severe rash involving mucus membranes or skin necrosis: no Has patient had a PCN reaction that required  hospitalization: no Has patient had a PCN reaction occurring within the last 10 years: no If all of the above answers are "NO", then may proceed with Cephalosporin use.     Social History   Socioeconomic History   Marital status: Divorced    Spouse name: Not on file   Number of children: 3   Years of education: Not on file   Highest education level: Not on file  Occupational History   Occupation: Retired    Fish farm manager: RETIRED  Tobacco Use   Smoking status: Former    Types: Cigarettes    Quit date: 03/01/1974    Years since quitting: 47.8   Smokeless tobacco: Never   Tobacco comments:    Former smoker 03/04/2021  Vaping Use   Vaping Use: Never used  Substance and Sexual Activity   Alcohol use: Yes    Alcohol/week: 2.0 standard drinks of alcohol    Types: 2 Standard drinks or equivalent per week    Comment: rare   Drug use: No   Sexual activity: Never  Other Topics Concern   Not on file  Social History Narrative   Not on file   Social Determinants of Health   Financial Resource Strain: Not on file  Food Insecurity: Not on file  Transportation Needs: Not on file  Physical Activity: Not on file  Stress: Not on file  Social Connections: Not on file  Intimate Partner Violence: Not on file     ROS- All systems are reviewed and negative except as per the HPI above.  Physical Exam: Vitals:   12/24/21 0835  BP: (!) 138/90  Pulse: 75  Weight: 96.6 kg  Height: '5\' 3"'$  (1.6 m)     GEN- The patient is a well appearing elderly female, alert and oriented x 3 today.   HEENT-head normocephalic, atraumatic, sclera clear, conjunctiva pink, hearing intact, trachea midline. Lungs- Clear to ausculation bilaterally, normal work of breathing Heart- Regular rate and rhythm, no murmurs, rubs or gallops  GI- soft, NT, ND, + BS Extremities- no clubbing, cyanosis, or edema MS- no significant deformity or atrophy Skin- no rash or lesion Psych- euthymic mood, full affect Neuro-  strength and sensation are intact   Wt Readings from Last 3 Encounters:  12/24/21 96.6 kg  09/24/21 96.2 kg  09/03/21 95.6 kg    EKG today demonstrates  SR, NST Vent. rate 75 BPM PR interval 158 ms QRS duration 68 ms QT/QTcB 348/388 ms  Echo 02/28/21 demonstrated  1. Left ventricular ejection fraction, by estimation, is 60 to 65%. The  left ventricle has normal function. The left ventricle has no regional  wall motion abnormalities. There is mild concentric left ventricular  hypertrophy. Left ventricular diastolic parameters are consistent with Grade I diastolic dysfunction (impaired relaxation).   2. Right ventricular systolic function is normal. The right ventricular  size is normal. There is normal pulmonary artery systolic pressure. The estimated right ventricular systolic pressure is 62.7 mmHg.   3. The mitral valve is normal in structure. Trivial mitral valve  regurgitation. No evidence of mitral stenosis.   4. The aortic valve is normal in structure. Aortic valve regurgitation is  not visualized. No aortic stenosis is present.   5. The inferior vena cava is normal in size with greater than 50%  respiratory variability, suggesting right atrial pressure of 3 mmHg.   Epic records are reviewed at length today  CHA2DS2-VASc Score = 5  The patient's score is based upon: CHF History: 0 HTN History: 1 Diabetes History: 1 Stroke History: 0 Vascular Disease History: 0 Age Score: 2 Gender Score: 1       ASSESSMENT AND PLAN: 1. Paroxysmal Atrial Fibrillation (ICD10:  I48.0) The patient's CHA2DS2-VASc score is 5, indicating a 7.2% annual risk of stroke.   Patient having brief but frequent palpitations.  Will increase diltiazem to 180 mg daily.  Continue Eliquis 5 mg BID   2. Secondary Hypercoagulable State (  ICD10:  D68.69) The patient is at significant risk for stroke/thromboembolism based upon her CHA2DS2-VASc Score of 5.  Continue Apixaban (Eliquis).   3.  Obesity Body mass index is 37.73 kg/m. Lifestyle modification was discussed and encouraged including regular physical activity and weight reduction.  4. Obstructive sleep apnea Patient has not followed through on getting sleep study scheduled.   5. HTN Stable, med changes as above.    Follow up in the AF clinic in 3 months.    [facility] [address] [place],  [address] [phone] 12/24/2021 8:44 AM

## 2021-12-24 ENCOUNTER — Ambulatory Visit (HOSPITAL_COMMUNITY)
Admission: RE | Admit: 2021-12-24 | Discharge: 2021-12-24 | Disposition: A | Discharge status: Home / Self Care | Payer: Medicare Other | Source: Ambulatory Visit | Attending: Physician Assistant | Admitting: Physician Assistant

## 2021-12-24 ENCOUNTER — Encounter (HOSPITAL_COMMUNITY): Payer: Self-pay | Admitting: Physician Assistant

## 2021-12-24 VITALS — BP 138/90 | HR 75 | Ht 63.0 in | Wt 213.0 lb

## 2021-12-24 DIAGNOSIS — D6869 Other thrombophilia: Secondary | ICD-10-CM | POA: Insufficient documentation

## 2021-12-24 DIAGNOSIS — E119 Type 2 diabetes mellitus without complications: Secondary | ICD-10-CM | POA: Diagnosis not present

## 2021-12-24 DIAGNOSIS — B1920 Unspecified viral hepatitis C without hepatic coma: Secondary | ICD-10-CM | POA: Insufficient documentation

## 2021-12-24 DIAGNOSIS — I480 Paroxysmal atrial fibrillation: Secondary | ICD-10-CM | POA: Diagnosis not present

## 2021-12-24 DIAGNOSIS — J45909 Unspecified asthma, uncomplicated: Secondary | ICD-10-CM | POA: Diagnosis not present

## 2021-12-24 DIAGNOSIS — Z7901 Long term (current) use of anticoagulants: Secondary | ICD-10-CM | POA: Insufficient documentation

## 2021-12-24 DIAGNOSIS — I10 Essential (primary) hypertension: Secondary | ICD-10-CM | POA: Diagnosis not present

## 2021-12-24 DIAGNOSIS — Z6837 Body mass index (BMI) 37.0-37.9, adult: Secondary | ICD-10-CM | POA: Diagnosis not present

## 2021-12-24 DIAGNOSIS — G4733 Obstructive sleep apnea (adult) (pediatric): Secondary | ICD-10-CM | POA: Diagnosis not present

## 2021-12-24 DIAGNOSIS — E669 Obesity, unspecified: Secondary | ICD-10-CM | POA: Insufficient documentation

## 2021-12-24 MED ORDER — DILTIAZEM HCL ER COATED BEADS 180 MG PO CP24: 180.0000 mg | ORAL_CAPSULE | Freq: Every day | ORAL | 1 refills | Status: DC

## 2021-12-24 NOTE — Patient Instructions (Signed)
Increase Diltiazem '180mg'$  daily

## 2021-12-28 ENCOUNTER — Other Ambulatory Visit (HOSPITAL_COMMUNITY): Payer: Self-pay | Admitting: Physician Assistant

## 2021-12-31 ENCOUNTER — Telehealth (HOSPITAL_COMMUNITY): Payer: Self-pay | Admitting: *Deleted

## 2021-12-31 ENCOUNTER — Emergency Department (HOSPITAL_COMMUNITY): Payer: Medicare Other

## 2021-12-31 ENCOUNTER — Emergency Department (HOSPITAL_COMMUNITY)
Admission: EM | Admit: 2021-12-31 | Discharge: 2021-12-31 | Disposition: A | Discharge status: Home / Self Care | Payer: Medicare Other | Attending: Emergency Medicine | Admitting: Emergency Medicine

## 2021-12-31 ENCOUNTER — Encounter (HOSPITAL_COMMUNITY): Payer: Self-pay | Admitting: Emergency Medicine

## 2021-12-31 DIAGNOSIS — I10 Essential (primary) hypertension: Secondary | ICD-10-CM | POA: Diagnosis not present

## 2021-12-31 DIAGNOSIS — I4891 Unspecified atrial fibrillation: Secondary | ICD-10-CM | POA: Diagnosis not present

## 2021-12-31 DIAGNOSIS — R531 Weakness: Secondary | ICD-10-CM | POA: Insufficient documentation

## 2021-12-31 DIAGNOSIS — M79642 Pain in left hand: Secondary | ICD-10-CM

## 2021-12-31 DIAGNOSIS — Z7984 Long term (current) use of oral hypoglycemic drugs: Secondary | ICD-10-CM | POA: Insufficient documentation

## 2021-12-31 DIAGNOSIS — Z7901 Long term (current) use of anticoagulants: Secondary | ICD-10-CM | POA: Insufficient documentation

## 2021-12-31 DIAGNOSIS — E119 Type 2 diabetes mellitus without complications: Secondary | ICD-10-CM | POA: Insufficient documentation

## 2021-12-31 DIAGNOSIS — Z79899 Other long term (current) drug therapy: Secondary | ICD-10-CM | POA: Insufficient documentation

## 2021-12-31 NOTE — ED Provider Notes (Signed)
[facility] EMERGENCY DEPARTMENT Provider Note   CSN: [id number] Arrival date & time: 12/31/21  1025     History  Chief Complaint  Patient presents with   Hand Problem   HPI KESIAH SPITZLEY is a 81 y.o. female with atrial fibrillation, diabetes, and hypertension presenting for hand pain.  Pain is located top left hand.  Denies trauma.  Pain started Saturday evening.  Patient was attempting to water her plants when her right hand felt weak and painful and she noticed some associated swelling.  Taken Tylenol for her pain which has improved but weakness and pain returned after the Tylenol wore off.  Patient states that she is currently taking her diltiazem and Eliquis as prescribed for atrial fibrillation.  Patient denies facial droop, slurred speech, weakness in any other extremities, changes in her gait.  HPI     Home Medications Prior to Admission medications   Medication Sig Start Date End Date Taking? Authorizing Provider  apixaban (ELIQUIS) 5 MG TABS tablet Take 1 tablet (5 mg total) by mouth 2 (two) times daily. 01/13/21   Tegeler, Canary Brim, MD  Ascorbic Acid (VITAMIN C PO) Take 1 tablet by mouth as needed.    [provider]  B-D ULTRAFINE III SHORT PEN 31G X 8 MM MISC SMARTSIG:2 Needle SUB-Q Daily 06/09/19   [provider]  Calcium Carbonate-Vitamin D 600-400 MG-UNIT tablet Take 1 tablet by mouth daily.    [provider]  cetirizine (ZYRTEC) 10 MG tablet Take 10 mg by mouth daily after breakfast.    [provider]  diltiazem (CARDIZEM CD) 180 MG 24 hr capsule Take 1 capsule (180 mg total) by mouth daily. 12/24/21   Fenton, Clint R, PA  fluticasone furoate-vilanterol (BREO ELLIPTA) 100-25 MCG/INH AEPB 1 puff by mouth daily 07/23/17   [provider]  glipiZIDE (GLUCOTROL) 5 MG tablet TAKE 2 TABLETS BY MOUTH EVERY MORNING AND 1 TABLET EVERY EVENING 06/27/19   [provider]  Lancets (ONETOUCH DELICA PLUS  LANCET33G) MISC U TO TEST BS BID 05/15/18   [provider]  LANTUS SOLOSTAR 100 UNIT/ML Solostar Pen SMARTSIG:35 Unit(s) SUB-Q Daily 02/27/19   [provider]  levothyroxine (SYNTHROID) 75 MCG tablet Take 75 mcg by mouth every morning. 01/17/21   [provider]  losartan (COZAAR) 50 MG tablet Take 25 mg by mouth 2 (two) times daily. 06/26/19   [provider]  montelukast (SINGULAIR) 10 MG tablet Take 10 mg by mouth daily after breakfast.     [provider]  Multiple Vitamin (MULTIVITAMIN) capsule Take 1 capsule by mouth daily.    [provider]  ONE TOUCH ULTRA TEST test strip  04/03/13   [provider]  pantoprazole (PROTONIX) 40 MG tablet Take 40 mg by mouth 2 (two) times daily.     [provider]  PROAIR HFA 108 (90 BASE) MCG/ACT inhaler Inhale 2 puffs into the lungs every 6 (six) hours as needed for wheezing or shortness of breath.  04/19/13   [provider]  psyllium (METAMUCIL) 58.6 % powder Taking 1 tsp by mouth twice daily    [provider]  rosuvastatin (CRESTOR) 20 MG tablet Take 1 tablet (20 mg total) by mouth daily. 03/07/21   Corky Crafts, MD  triamcinolone cream (KENALOG) 0.1 % Apply 1 application topically daily as needed (skin irritation).  09/18/13   [provider]      Allergies    Aspirin, Ciprofloxacin, Codeine, Duloxetine  hcl, Nitrofuran derivatives, and Penicillins    Review of Systems   Review of Systems  Physical Exam Updated Vital Signs BP (!) 160/85 (BP Location: Right Arm)   Pulse 65   Temp 98.3 F (36.8 C) (Oral)   Resp 16   SpO2 100%  Physical Exam Vitals and nursing note reviewed.  HENT:     Head: Normocephalic and atraumatic.     Mouth/Throat:     Mouth: Mucous membranes are moist.  Eyes:     General:        Right eye: No discharge.        Left eye: No discharge.     Conjunctiva/sclera: Conjunctivae normal.  Cardiovascular:     Rate and  Rhythm: Normal rate and regular rhythm.     Pulses: Normal pulses.     Heart sounds: Normal heart sounds.  Pulmonary:     Effort: Pulmonary effort is normal.     Breath sounds: Normal breath sounds.  Abdominal:     General: Abdomen is flat.     Palpations: Abdomen is soft.  Musculoskeletal:     Right hand: Swelling present.     Left hand: Normal.     Comments: Mild swelling noted about the left thumb extending to the dorsal aspect of the left hand.  Skin:    General: Skin is warm and dry.  Neurological:     General: No focal deficit present.     Comments: GCS 15. Speech is goal oriented. No deficits appreciated to CN III-XII; symmetric eyebrow raise, no facial drooping, tongue midline. Patient has 4/5 grip in left hand and 5/5 grip in right hand. Strength against resistance in all major muscle groups equal bilaterally. Sensation to light touch intact. Patient moves extremities without ataxia. Normal finger-nose-finger. Patient ambulatory with steady gait.   Psychiatric:        Mood and Affect: Mood normal.     ED Results / Procedures / Treatments   Labs (all labs ordered are listed, but only abnormal results are displayed) Labs Reviewed - No data to display  EKG None  Radiology CT Head Wo Contrast  Result Date: 12/31/2021 CLINICAL DATA:  Right hand weakness onset 4 days ago. EXAM: CT HEAD WITHOUT CONTRAST TECHNIQUE: Contiguous axial images were obtained from the base of the skull through the vertex without intravenous contrast. RADIATION DOSE REDUCTION: This exam was performed according to the departmental dose-optimization program which includes automated exposure control, adjustment of the mA and/or kV according to patient size and/or use of iterative reconstruction technique. COMPARISON:  None Available. FINDINGS: Brain: No evidence of acute infarction, hemorrhage, hydrocephalus, extra-axial collection or mass lesion/mass effect. There is mild atrophy and minimal small-vessel  changes in the deep white matter of both cerebral hemispheres. Vascular: There is patchy calcification of the carotid siphons but no hyperdense central vessels are seen. Skull: Negative for fractures or focal lesions. Sinuses/Orbits: Visualized sinuses and mastoid air cells are clear. Old lens extractions. Other: None. IMPRESSION: No acute intracranial CT findings. Mild atrophy with early small-vessel changes. Carotid atherosclerosis. Electronically Signed   By: Almira Bar M.D.   On: 12/31/2021 21:37   DG Hand Complete Left  Result Date: 12/31/2021 CLINICAL DATA:  Pain and swelling EXAM: LEFT HAND - COMPLETE 3+ VIEW COMPARISON:  None Available. FINDINGS: No fracture or dislocation is seen. Degenerative changes are noted in intercarpal joints along the lateral aspect of the wrist. Degenerative changes are noted in first carpometacarpal joint and multiple interphalangeal joints, more  prominent in the left thumb. Minimal bony spurs seen in first metacarpophalangeal joint. IMPRESSION: No fracture or dislocation is seen in left hand. Degenerative changes are noted in multiple joints as described in the body of the report. Electronically Signed   By: Ernie Avena M.D.   On: 12/31/2021 11:53    Procedures Procedures    Medications Ordered in ED Medications - No data to display  ED Course/ Medical Decision Making/ A&P Clinical Course as of 12/31/21 2154  Wed Dec 31, 2021  2018 I personally evaluated this patient at bedside.  She is endorsing 4 days of left hand weakness intermittently. [CC]  2019   She has a history of hypertension, A-fib currently on diltiazem and Eliquis. She endorses 4 days of episodic left hand pain and weakness.  She was sent over for evaluation of stroke.  She has no history of CVA.  She currently actually endorses her symptoms are grossly improving.  She does have minor grip strength disturbance on the left side.  She has been writing long form handwritten letters lately  and endorses a history of carpal tunnel on the right wrist.  She is concerned that this may be happening on her left wrist. She is otherwise ambulatory tolerating p.o. intake.  Denies any other symptoms at this time.  She states today she got frustrated when she could not open a jar due to pain and weakness. Will evaluate with CT noncontrasted head to evaluate for intracranial insult.  Will evaluate left hand bony structures with x-ray. If normal, will plan for treatment empirically with wrist bracing, outpatient follow-up with primary care provider if symptoms recurred. [CC]    Clinical Course User Index [CC] Glyn Ade, MD                           Medical Decision Making Amount and/or Complexity of Data Reviewed Radiology: ordered.   This patient presents to the ED for concern of hand pain/weakness, this involves a number of treatment options, and is a complaint that carries with it a high risk of complications and morbidity.  The differential diagnosis includes stroke, carpal tunnel syndrome, and fracture.    Co morbidities: Discussed in HPI    EMR reviewed including pt PMHx, past surgical history and past visits to ER.   See HPI for more details   Lab Tests:   No labs ordered   Imaging Studies:  NAD. I personally reviewed all imaging studies and no acute abnormality found. I agree with radiology interpretation.    Cardiac Monitoring:  The patient was maintained on a cardiac monitor.  I personally viewed and interpreted the cardiac monitored which showed an underlying rhythm of: NSR NA   Medicines ordered:  I offered Tylenol for hand pain but patient declined stating that pain was only mild. Reevaluation of the patient after these medicines showed that the patient stayed the same I have reviewed the patients home medicines and have made adjustments as needed     Consults/Attending Physician   I discussed this case with my attending physician who  cosigned this note including patient's presenting symptoms, physical exam, and planned diagnostics and interventions. Attending physician stated agreement with plan or made changes to plan which were implemented.   Reevaluation:  After the interventions noted above I re-evaluated patient and found that they have :stayed the same   Problem List / ED Course: Patient presented for hand pain and weakness.  Was referred  to the ED via A-fib clinic for concern for stroke.  On exam, there was notable weakness in her left hand in comparison to her right.  Given that she is on a blood thinner, went ahead and ordered Noncon head CT.  CT scan was unremarkable.  Symptoms are likely peripheral in nature.  Cannot rule out carpal tunnel syndrome and/or degenerative changes that may be contributing to her pain and weakness in her hand at this time.  Stroke is unlikely given negative CT and overall reassuring neuro exam.   Dispostion:  After consideration of the diagnostic results and the patients response to treatment, I feel that the patent would benefit from discharge and follow-up with PCP regarding left hand pain and swelling.        Final Clinical Impression(s) / ED Diagnoses Final diagnoses:  Left hand pain    Rx / DC Orders ED Discharge Orders     None         Gareth Eagle, PA-C 12/31/21 2154    Glyn Ade, MD 01/01/22 [id number]

## 2021-12-31 NOTE — ED Triage Notes (Signed)
Patient complains of swelling and weakness in her right hand that started on Saturday four days ago. Patient states she called the afib clinic and was instructed to go to the emergency department for evaluation of a stroke. No dysarthria, no facial droop, no arm drift, grip strength weak in left hand versus right.

## 2021-12-31 NOTE — Progress Notes (Signed)
Orthopedic Tech Progress Note Patient Details:  Jaime Boone 06-Dec-1940 [id number]  Ortho Devices Type of Ortho Device: Velcro wrist splint Ortho Device/Splint Location: LUE Ortho Device/Splint Interventions: Ordered, Application, Adjustment   Post Interventions Patient Tolerated: Well Instructions Provided: Adjustment of device, Care of device  [place] 12/31/2021, 11:01 PM

## 2021-12-31 NOTE — ED Provider Triage Note (Signed)
Emergency Medicine Provider Triage Evaluation Note  Jaime Boone , a 81 y.o. female  was evaluated in triage.  Pt complains of left hand swelling, pain and weakness.  Stated it started on Saturday after she got done cleaning the bathroom.  Swelling has progressively improved but pain and weakness have persisted.  Does not know of any inciting injury.  Has tried Voltaren gel and compression wraps with moderate relief.  Review of Systems  Positive: As above Negative: Numbness, tingling  Physical Exam  BP (!) 156/95 (BP Location: Right Arm)   Pulse 81   Temp 98.8 F (37.1 C) (Oral)   Resp 18   SpO2 96%  Gen:   Awake, no distress   Resp:  Normal effort  MSK:   Moves extremities without difficulty, patient is hesitant to grip with her left hand, but grip strength is equal bilaterally Other:  Slightly erythematous area to the left dorsum of the hand.  No facial asymmetry, slurred speech, pronator drift, unilateral or global weakness.  Heel-to-shin normal.  Sensation intact.  Medical Decision Making  Medically screening exam initiated at 11:32 AM.  Appropriate orders placed.  Jaime Boone was informed that the remainder of the evaluation will be completed by another provider, this initial triage assessment does not replace that evaluation, and the importance of remaining in the ED until their evaluation is complete.     Jaime Boone, [place] 12/31/21 1135

## 2021-12-31 NOTE — Telephone Encounter (Signed)
Patient called in stating since Sunday she has noticed left hand weakness and some intermittent swelling. Pt states her grips on left side are diminished. She dropped a container of creamer in her kitchen this morning due to the weakness. Discussed with Adline Peals PA with left sided weakness would recommend proceeding to nearest ER for further evaluation. Pt states no missed doses of Eliquis. Pt verbalized agreement.

## 2021-12-31 NOTE — Discharge Instructions (Addendum)
Evaluation for your left hand pain and associated concern for stroke was overall reassuring.  Head CT did not reveal concern for stroke at this time.  If you have new facial droop, slurred speech, headache, change in your gait or new peripheral extremity weakness is return to the emergency department for further evaluation.   Your hand pain is likely related to carpal tunnel syndrome and/or degenerative changes in the bones in your hand.  Recommend that you follow-up with your PCP regarding your ongoing hand pain and swelling.

## 2022-01-21 ENCOUNTER — Ambulatory Visit (INDEPENDENT_AMBULATORY_CARE_PROVIDER_SITE_OTHER): Payer: Medicare Other | Admitting: Podiatrist

## 2022-01-21 DIAGNOSIS — S90122A Contusion of left lesser toe(s) without damage to nail, initial encounter: Secondary | ICD-10-CM | POA: Diagnosis not present

## 2022-01-21 NOTE — Progress Notes (Signed)
Chief Complaint  Patient presents with   Nail Problem     Right foot blister on second toe , 4th digit possible hangnail patient is concerned and would like it cut off     HPI: Patient is 81 y.o. female who presents today for right foot blister on the second toe.   She went to the  salon 3 weeks ago and now its blistered. She relates there is no pain present.  She is also concerned about a small hangnail on the right fourth toe and would like it trimmed off.  She denies any nausea, vomiting, fevers, or chills.  Patient Active Problem List   Diagnosis Date Noted   Paroxysmal atrial fibrillation ([place]) 01/20/2021   Hypercoagulable state due to paroxysmal atrial fibrillation ([place]) 01/20/2021   Pain due to onychomycosis of toenail of right foot 07/14/2019   Acute hepatitis 10/12/2016   Hypertension 10/12/2016   Sleep apnea 10/12/2016   Type 2 diabetes mellitus ([place]) 10/12/2016   Menopause 03/16/2016   Keratosis 04/17/2015   Vaginal lesion 03/14/2015   Sebaceous cyst of labia 04/06/2012   History of hepatitis C 04/06/2012   Constipation 04/06/2012   Postmenopausal 03/01/2012   Vaginal atrophy 03/01/2012    Current Outpatient Medications on File Prior to Visit  Medication Sig Dispense Refill   apixaban (ELIQUIS) 5 MG TABS tablet Take 1 tablet (5 mg total) by mouth 2 (two) times daily. 60 tablet 0   Ascorbic Acid (VITAMIN C PO) Take 1 tablet by mouth as needed.     B-D ULTRAFINE III SHORT PEN 31G X 8 MM MISC SMARTSIG:2 Needle SUB-Q Daily     Calcium Carbonate-Vitamin D 600-400 MG-UNIT tablet Take 1 tablet by mouth daily.     cetirizine (ZYRTEC) 10 MG tablet Take 10 mg by mouth daily after breakfast.     diltiazem (CARDIZEM CD) 180 MG 24 hr capsule Take 1 capsule (180 mg total) by mouth daily. 90 capsule 1   fluticasone furoate-vilanterol (BREO ELLIPTA) 100-25 MCG/INH AEPB 1 puff by mouth daily     glipiZIDE (GLUCOTROL) 5 MG tablet TAKE 2 TABLETS BY MOUTH EVERY MORNING AND 1 TABLET  EVERY EVENING     Lancets (ONETOUCH DELICA PLUS [id number]) MISC U TO TEST BS BID     LANTUS SOLOSTAR 100 UNIT/ML Solostar Pen SMARTSIG:35 Unit(s) SUB-Q Daily     levothyroxine (SYNTHROID) 75 MCG tablet Take 75 mcg by mouth every morning.     losartan (COZAAR) 50 MG tablet Take 25 mg by mouth 2 (two) times daily.     montelukast (SINGULAIR) 10 MG tablet Take 10 mg by mouth daily after breakfast.      Multiple Vitamin (MULTIVITAMIN) capsule Take 1 capsule by mouth daily.     ONE TOUCH ULTRA TEST test strip      pantoprazole (PROTONIX) 40 MG tablet Take 40 mg by mouth 2 (two) times daily.      PROAIR HFA 108 (90 BASE) MCG/ACT inhaler Inhale 2 puffs into the lungs every 6 (six) hours as needed for wheezing or shortness of breath.      psyllium (METAMUCIL) 58.6 % powder Taking 1 tsp by mouth twice daily     rosuvastatin (CRESTOR) 20 MG tablet Take 1 tablet (20 mg total) by mouth daily. 90 tablet 3   triamcinolone cream (KENALOG) 0.1 % Apply 1 application topically daily as needed (skin irritation).      No current facility-administered medications on file prior to visit.    Allergies  Allergen  Reactions   Aspirin Other (See Comments)    Stomach pain   Ciprofloxacin Nausea And Vomiting   Codeine Nausea And Vomiting   Duloxetine Hcl Nausea And Vomiting   Nitrofuran Derivatives    Penicillins Swelling    THROAT Has patient had a PCN reaction causing immediate rash, facial/tongue/throat swelling, SOB or lightheadedness with hypotension: yes Has patient had a PCN reaction causing severe rash involving mucus membranes or skin necrosis: no Has patient had a PCN reaction that required hospitalization: no Has patient had a PCN reaction occurring within the last 10 years: no If all of the above answers are "NO", then may proceed with Cephalosporin use.     Review of Systems No fevers, chills, nausea, muscle aches, no difficulty breathing, no calf pain, no chest pain or shortness of  breath.   Physical Exam  GENERAL APPEARANCE: Alert, conversant. Appropriately groomed. No acute distress.   VASCULAR: Pedal pulses palpable 2/4 DP and  PT bilateral.  Capillary refill time is immediate to all digits,  Proximal to distal cooling is warm to warm.  Digital perfusion adequate.   NEUROLOGIC: sensation is intact to 5.07 monofilament at 5/5 sites bilateral.  Light touch is intact bilateral, vibratory sensation intact bilateral  MUSCULOSKELETAL: acceptable muscle strength, tone and stability bilateral.  No gross boney pedal deformities noted.  No pain, crepitus or limitation noted with foot and ankle range of motion bilateral.   DERMATOLOGIC: skin is warm, supple, and dry.  Right second toenail has a small dried stable blister present. No redness, no swelling no sign of infection present. Left foot fourth toe has a small hangnail present - no sign of infection present.     Assessment     ICD-10-CM   1. Contusion of lesser toe of left foot without damage to nail, initial encounter  O35.009F        Plan  Discussed exam findings.  The hangnail is trimmed back.  Recommended she watch the area of blistering as it should resolve on its own-  appears to be from her toenail trim at the salon she had.  Does not appear infected.  Recommended epsom salt soaks as well.  She will call if pain arises or if concerns arise.  She would like to get her nails cut at our office in the future-  will set her up for nail trim in 3 months.

## 2022-02-17 ENCOUNTER — Other Ambulatory Visit: Payer: Self-pay | Admitting: Interventional Cardiology

## 2022-03-09 ENCOUNTER — Other Ambulatory Visit: Payer: Self-pay | Admitting: Interventional Cardiology

## 2022-03-20 ENCOUNTER — Telehealth: Payer: Self-pay | Admitting: Interventional Cardiology

## 2022-03-20 MED ORDER — ROSUVASTATIN CALCIUM 20 MG PO TABS: 20.0000 mg | ORAL_TABLET | Freq: Every day | ORAL | 0 refills | Status: DC

## 2022-03-20 NOTE — Telephone Encounter (Signed)
*  STAT* If patient is at the pharmacy, call can be transferred to refill team.   1. Which medications need to be refilled? (please list name of each medication and dose if known)   rosuvastatin (CRESTOR) 20 MG tablet    2. Which pharmacy/location (including street and city if local pharmacy) is medication to be sent to?  [place], [place]    3. Do they need a 30 day or 90 day supply? 24  Pt made an appt for 06/24/22 with Dr. Irish Lack

## 2022-03-20 NOTE — Telephone Encounter (Signed)
Rx sent. Pt must keep upcoming appt 05/2022 for further refills.

## 2022-03-22 ENCOUNTER — Other Ambulatory Visit (HOSPITAL_COMMUNITY): Payer: Self-pay | Admitting: Physician Assistant

## 2022-03-27 ENCOUNTER — Ambulatory Visit (HOSPITAL_COMMUNITY): Payer: Medicare Other | Admitting: Physician Assistant

## 2022-04-10 ENCOUNTER — Ambulatory Visit (HOSPITAL_COMMUNITY)
Admission: RE | Admit: 2022-04-10 | Discharge: 2022-04-10 | Disposition: A | Discharge status: Home / Self Care | Payer: 59 | Source: Ambulatory Visit | Attending: Physician Assistant | Admitting: Physician Assistant

## 2022-04-10 VITALS — BP 126/70 | HR 69 | Ht 63.0 in | Wt 202.8 lb

## 2022-04-10 DIAGNOSIS — I10 Essential (primary) hypertension: Secondary | ICD-10-CM | POA: Insufficient documentation

## 2022-04-10 DIAGNOSIS — Z833 Family history of diabetes mellitus: Secondary | ICD-10-CM | POA: Diagnosis not present

## 2022-04-10 DIAGNOSIS — D6869 Other thrombophilia: Secondary | ICD-10-CM | POA: Insufficient documentation

## 2022-04-10 DIAGNOSIS — G4733 Obstructive sleep apnea (adult) (pediatric): Secondary | ICD-10-CM | POA: Insufficient documentation

## 2022-04-10 DIAGNOSIS — Z6835 Body mass index (BMI) 35.0-35.9, adult: Secondary | ICD-10-CM | POA: Insufficient documentation

## 2022-04-10 DIAGNOSIS — I480 Paroxysmal atrial fibrillation: Secondary | ICD-10-CM | POA: Diagnosis present

## 2022-04-10 DIAGNOSIS — Z91199 Patient's noncompliance with other medical treatment and regimen due to unspecified reason: Secondary | ICD-10-CM | POA: Diagnosis not present

## 2022-04-10 DIAGNOSIS — E119 Type 2 diabetes mellitus without complications: Secondary | ICD-10-CM | POA: Diagnosis not present

## 2022-04-10 DIAGNOSIS — E669 Obesity, unspecified: Secondary | ICD-10-CM | POA: Diagnosis not present

## 2022-04-10 DIAGNOSIS — Z8249 Family history of ischemic heart disease and other diseases of the circulatory system: Secondary | ICD-10-CM | POA: Insufficient documentation

## 2022-04-10 DIAGNOSIS — Z87891 Personal history of nicotine dependence: Secondary | ICD-10-CM | POA: Insufficient documentation

## 2022-04-10 DIAGNOSIS — Z7901 Long term (current) use of anticoagulants: Secondary | ICD-10-CM | POA: Diagnosis not present

## 2022-04-10 NOTE — Progress Notes (Signed)
Primary Care Physician: Prince Solian, MD Primary Cardiologist: Dr Irish Lack Primary Electrophysiologist: none Referring Physician: [facility]   Jaime Boone is a 82 y.o. female with a history of DM, HTN, OSA, asthma, hep C, atrial fibrillation who presents for follow up in the [facility].  The patient was initially diagnosed with atrial fibrillation 01/13/21 after presenting to the ED with symptoms of shakiness, lightheadedness, and palpitations. EMS was called and ECG showed afib with RVR. She was started on a diltiazem drip in the ED and converted to SR. Patient was started on Eliquis for a CHADS2VASC score of 5 and diltiazem for rate control. Patient reports that she has had intermittent episodes of shakiness and lightheadedness since leaving the ED. She checked her blood glucose during these episodes and it was low. She ate something sweet and her symptoms resolved. She was diagnosed with OSA remotely but never started CPAP.  On follow up today, patient reports that she has done well since her last visit. Her palpitations have resolved on the higher dose of diltiazem. No bleeding issues on anticoagulation.   Today, she denies symptoms of palpitations, chest pain, shortness of breath, orthopnea, PND, lower extremity edema, presyncope, syncope, bleeding, or neurologic sequela. The patient is tolerating medications without difficulties and is otherwise without complaint today.    Atrial Fibrillation Risk Factors:  she does have symptoms or diagnosis of sleep apnea. she is not compliant with CPAP therapy. she does not have a history of rheumatic fever. she does not have a history of alcohol use.   she has a BMI of Body mass index is 35.92 kg/m.Marland Kitchen Filed Weights   04/10/22 0821  Weight: 92 kg    Family History  Problem Relation Age of Onset   Hypertension Mother    Diabetes Mother    Hypertension Father    Diabetes Father    Heart disease  Father    Prostate cancer Father    Diabetes Sister    Heart disease Brother    Prostate cancer Brother    Lung cancer Cousin    Colon cancer Neg Hx    Stomach cancer Neg Hx    Esophageal cancer Neg Hx    Pancreatic cancer Neg Hx    Liver disease Neg Hx      Atrial Fibrillation Management history:  Previous antiarrhythmic drugs: none Previous cardioversions: none Previous ablations: none CHADS2VASC score: 5 Anticoagulation history: Eliquis   Past Medical History:  Diagnosis Date   Acid reflux    Acute bilateral knee pain    Arthritis    Asthma    Asthma    Atrial fibrillation ([place])    Chest pain    Cirrhosis ([place])    Connective tissue disease ([place])    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   Degenerative joint disease    Diabetes mellitus without complication (HCC)    DJD (degenerative joint disease)    DM (diabetes mellitus) ([place])    Fatigue    Fatty liver    GERD (gastroesophageal reflux disease)    Hepatitis C    History of hepatitis C    Hypertension    Jaw pain    OSA (obstructive sleep apnea)    Sleep apnea    Tiredness    Past Surgical History:  Procedure Laterality Date   ABDOMINAL HYSTERECTOMY     TAH   APPENDECTOMY     DILATION AND CURETTAGE OF UTERUS     EYE SURGERY Left  CATARACT   FOOT SURGERY Bilateral    BUNIONECTOMY   GALLBLADDER SURGERY     keiloid removal      ear   NOSE SURGERY     oral stone extraction      Current Outpatient Medications  Medication Sig Dispense Refill   apixaban (ELIQUIS) 5 MG TABS tablet Take 1 tablet (5 mg total) by mouth 2 (two) times daily. 60 tablet 0   Ascorbic Acid (VITAMIN C PO) Take 1 tablet by mouth every morning.     B-D ULTRAFINE III SHORT PEN 31G X 8 MM MISC SMARTSIG:2 Needle SUB-Q Daily     Calcium Carbonate-Vitamin D 600-400 MG-UNIT tablet Take 1 tablet by mouth daily.     cetirizine (ZYRTEC) 10 MG tablet Take 10 mg by mouth daily after breakfast.     diclofenac Sodium (VOLTAREN) 1 % GEL Apply  topically as needed.     diltiazem (CARDIZEM CD) 180 MG 24 hr capsule TAKE 1 CAPSULE(180 MG) BY MOUTH DAILY 90 capsule 1   escitalopram (LEXAPRO) 5 MG tablet Take 5 mg by mouth at bedtime.     fluticasone furoate-vilanterol (BREO ELLIPTA) 100-25 MCG/INH AEPB 1 puff by mouth daily     glipiZIDE (GLUCOTROL) 5 MG tablet TAKE 2 TABLETS BY MOUTH EVERY MORNING AND 1 TABLET EVERY EVENING     Homeopathic Products (THERAWORX RELIEF) FOAM Apply topically as needed (for pain).     Lancets (ONETOUCH DELICA PLUS [id number]) MISC U TO TEST BS BID     LANTUS SOLOSTAR 100 UNIT/ML Solostar Pen 20 units in the am and 10 units at night     levothyroxine (SYNTHROID) 75 MCG tablet Take 75 mcg by mouth every morning.     losartan (COZAAR) 100 MG tablet Take 50 mg by mouth 2 (two) times daily.     montelukast (SINGULAIR) 10 MG tablet Take 10 mg by mouth daily after breakfast.      Multiple Vitamin (MULTIVITAMIN) capsule Taking 2 gummies by mouth daily     ONE TOUCH ULTRA TEST test strip      pantoprazole (PROTONIX) 40 MG tablet Take 40 mg by mouth 2 (two) times daily.      PROAIR HFA 108 (90 BASE) MCG/ACT inhaler Inhale 2 puffs into the lungs every 6 (six) hours as needed for wheezing or shortness of breath.      psyllium (METAMUCIL) 58.6 % powder Taking 1 tsp by mouth twice daily     rosuvastatin (CRESTOR) 20 MG tablet Take 1 tablet (20 mg total) by mouth daily. MUST KEEP APPT 05/2022 FOR FURTHER REFILLS 90 tablet 0   triamcinolone cream (KENALOG) 0.1 % Apply 1 application topically daily as needed (skin irritation).      No current facility-administered medications for this encounter.    Allergies  Allergen Reactions   Aspirin Other (See Comments)    Stomach pain   Ciprofloxacin Nausea And Vomiting   Codeine Nausea And Vomiting   Duloxetine Hcl Nausea And Vomiting   Nitrofuran Derivatives    Penicillins Swelling    THROAT Has patient had a PCN reaction causing immediate rash, facial/tongue/throat swelling,  SOB or lightheadedness with hypotension: yes Has patient had a PCN reaction causing severe rash involving mucus membranes or skin necrosis: no Has patient had a PCN reaction that required hospitalization: no Has patient had a PCN reaction occurring within the last 10 years: no If all of the above answers are "NO", then may proceed with Cephalosporin use.     Social  History   Socioeconomic History   Marital status: Divorced    Spouse name: Not on file   Number of children: 3   Years of education: Not on file   Highest education level: Not on file  Occupational History   Occupation: Retired    Fish farm manager: RETIRED  Tobacco Use   Smoking status: Former    Types: Cigarettes    Quit date: 03/01/1974    Years since quitting: 48.1   Smokeless tobacco: Never   Tobacco comments:    Former smoker 03/04/2021  Vaping Use   Vaping Use: Never used  Substance and Sexual Activity   Alcohol use: Yes    Alcohol/week: 2.0 standard drinks of alcohol    Types: 2 Standard drinks or equivalent per week    Comment: rare   Drug use: No   Sexual activity: Never  Other Topics Concern   Not on file  Social History Narrative   Not on file   Social Determinants of Health   Financial Resource Strain: Not on file  Food Insecurity: Not on file  Transportation Needs: Not on file  Physical Activity: Not on file  Stress: Not on file  Social Connections: Not on file  Intimate Partner Violence: Not on file     ROS- All systems are reviewed and negative except as per the HPI above.  Physical Exam: Vitals:   04/10/22 0821  BP: 126/70  Pulse: 69  Weight: 92 kg  Height: '5\' 3"'$  (1.6 m)     GEN- The patient is a well appearing obese female, alert and oriented x 3 today.   HEENT-head normocephalic, atraumatic, sclera clear, conjunctiva pink, hearing intact, trachea midline. Lungs- Clear to ausculation bilaterally, normal work of breathing Heart- Regular rate and rhythm, no murmurs, rubs or gallops   GI- soft, NT, ND, + BS Extremities- no clubbing, cyanosis, or edema MS- no significant deformity or atrophy Skin- no rash or lesion Psych- euthymic mood, full affect Neuro- strength and sensation are intact   Wt Readings from Last 3 Encounters:  04/10/22 92 kg  12/24/21 96.6 kg  09/24/21 96.2 kg    EKG today demonstrates  SR, NST Vent. rate 69 BPM PR interval 134 ms QRS duration 64 ms QT/QTcB 368/394 ms  Echo 02/28/21 demonstrated  1. Left ventricular ejection fraction, by estimation, is 60 to 65%. The  left ventricle has normal function. The left ventricle has no regional  wall motion abnormalities. There is mild concentric left ventricular  hypertrophy. Left ventricular diastolic parameters are consistent with Grade I diastolic dysfunction (impaired relaxation).   2. Right ventricular systolic function is normal. The right ventricular  size is normal. There is normal pulmonary artery systolic pressure. The estimated right ventricular systolic pressure is 87.5 mmHg.   3. The mitral valve is normal in structure. Trivial mitral valve  regurgitation. No evidence of mitral stenosis.   4. The aortic valve is normal in structure. Aortic valve regurgitation is  not visualized. No aortic stenosis is present.   5. The inferior vena cava is normal in size with greater than 50%  respiratory variability, suggesting right atrial pressure of 3 mmHg.   Epic records are reviewed at length today  CHA2DS2-VASc Score = 5  The patient's score is based upon: CHF History: 0 HTN History: 1 Diabetes History: 1 Stroke History: 0 Vascular Disease History: 0 Age Score: 2 Gender Score: 1          ASSESSMENT AND PLAN: 1. Paroxysmal Atrial Fibrillation (ICD10:  I48.0) The patient's CHA2DS2-VASc score is 5, indicating a 7.2% annual risk of stroke.   Patient appears to be maintaining SR.  Continue diltiazem 180 mg daily.  Continue Eliquis 5 mg BID   2. Secondary Hypercoagulable State  (ICD10:  D68.69) The patient is at significant risk for stroke/thromboembolism based upon her CHA2DS2-VASc Score of 5.  Continue Apixaban (Eliquis).   3. Obesity Body mass index is 35.92 kg/m. Lifestyle modification was discussed and encouraged including regular physical activity and weight reduction.  4. Obstructive sleep apnea Patient not on CPAP.  5. HTN Stable, no changes today.   Follow up with Dr Irish Lack as scheduled. AF clinic in one year.    [facility] [address] [place], [place] [address] [phone] 04/10/2022 8:45 AM

## 2022-06-08 ENCOUNTER — Ambulatory Visit (INDEPENDENT_AMBULATORY_CARE_PROVIDER_SITE_OTHER): Payer: 59 | Admitting: Podiatry

## 2022-06-08 ENCOUNTER — Encounter: Payer: Self-pay | Admitting: Podiatry

## 2022-06-08 VITALS — BP 147/75

## 2022-06-08 DIAGNOSIS — E1142 Type 2 diabetes mellitus with diabetic polyneuropathy: Secondary | ICD-10-CM | POA: Diagnosis not present

## 2022-06-08 DIAGNOSIS — M2041 Other hammer toe(s) (acquired), right foot: Secondary | ICD-10-CM | POA: Diagnosis not present

## 2022-06-08 DIAGNOSIS — M79674 Pain in right toe(s): Secondary | ICD-10-CM

## 2022-06-08 DIAGNOSIS — M2042 Other hammer toe(s) (acquired), left foot: Secondary | ICD-10-CM

## 2022-06-08 DIAGNOSIS — M21611 Bunion of right foot: Secondary | ICD-10-CM

## 2022-06-08 DIAGNOSIS — B351 Tinea unguium: Secondary | ICD-10-CM | POA: Diagnosis not present

## 2022-06-08 DIAGNOSIS — E119 Type 2 diabetes mellitus without complications: Secondary | ICD-10-CM

## 2022-06-08 DIAGNOSIS — Z9889 Other specified postprocedural states: Secondary | ICD-10-CM

## 2022-06-08 DIAGNOSIS — N1831 Chronic kidney disease, stage 3a: Secondary | ICD-10-CM | POA: Insufficient documentation

## 2022-06-08 NOTE — Progress Notes (Signed)
ANNUAL DIABETIC FOOT EXAM  Subjective: TRAN FARISH presents today for annual diabetic foot examination. Patient has h/o bunionectomy b/l and b/l 2nd toe hammertoe surgery. She is having some pain of the right great toe and feels the screw may be loose. She also has concern of painful right 2nd toenail which is thick and painful. Chief Complaint  Patient presents with   Nail Problem    DFC BS-178 A1C-8.? PCP-[facility] PCP VST-01/2022    Patient confirms h/o diabetes.  Patient relates 22 year h/o diabetes.  Patient denies any h/o foot wounds.  Patient has been diagnosed with neuropathy; has numbness and tingling in toes.  Risk factors: diabetes, HTN, CKD, h/o tobacco use in remission.  Prince Solian, MD is patient's PCP.  Past Medical History:  Diagnosis Date   Acid reflux    Acute bilateral knee pain    Arthritis    Asthma    Asthma    Atrial fibrillation ([place])    Chest pain    Cirrhosis ([place])    Connective tissue disease ([place])    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   Degenerative joint disease    Diabetes mellitus without complication (HCC)    DJD (degenerative joint disease)    DM (diabetes mellitus) ([place])    Fatigue    Fatty liver    GERD (gastroesophageal reflux disease)    Hepatitis C    History of hepatitis C    Hypertension    Jaw pain    OSA (obstructive sleep apnea)    Sleep apnea    Tiredness    Patient Active Problem List   Diagnosis Date Noted   Chronic kidney disease, stage 3a ([place]) 06/08/2022   Paroxysmal atrial fibrillation ([place]) 01/20/2021   Hypercoagulable state due to paroxysmal atrial fibrillation ([place]) 01/20/2021   Arthritis 12/10/2020   Pain due to onychomycosis of toenail of right foot 07/14/2019   Acute hepatitis 10/12/2016   Hypertension 10/12/2016   Sleep apnea 10/12/2016   Type 2 diabetes mellitus ([place]) 10/12/2016   Menopause 03/16/2016   Keratosis 04/17/2015   Vaginal lesion 03/14/2015   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    Diabetic retinopathy associated with type 2 diabetes mellitus ([place]) 05/10/2014   Anemia 05/31/2012   Sebaceous cyst of labia 04/06/2012   History of hepatitis C 04/06/2012   Constipation 04/06/2012   Postmenopausal 03/01/2012   Vaginal atrophy 03/01/2012   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 60 tablet 0   Ascorbic Acid (VITAMIN C PO) Take 1 tablet by mouth every morning.     B-D ULTRAFINE III SHORT PEN 31G X 8 MM MISC SMARTSIG:2 Needle SUB-Q Daily     Calcium Carbonate-Vitamin D 600-400 MG-UNIT tablet Take 1 tablet by mouth daily.     cetirizine (ZYRTEC) 10 MG tablet Take 10 mg by mouth daily after breakfast.     diclofenac Sodium (  VOLTAREN) 1 % GEL Apply topically as needed.     diltiazem (CARDIZEM CD) 180 MG 24 hr capsule TAKE 1 CAPSULE(180 MG) BY MOUTH DAILY 90 capsule 1   escitalopram (LEXAPRO) 5 MG tablet Take 5 mg by mouth at bedtime.     fluticasone furoate-vilanterol (BREO ELLIPTA) 100-25 MCG/INH AEPB 1 puff by mouth daily     glipiZIDE (GLUCOTROL) 5 MG tablet TAKE 2 TABLETS BY MOUTH EVERY MORNING AND 1 TABLET EVERY EVENING     Homeopathic Products (THERAWORX RELIEF) FOAM Apply topically as needed (for pain).     Lancets (ONETOUCH DELICA PLUS [id number]) MISC U TO TEST BS BID     LANTUS SOLOSTAR 100 UNIT/ML Solostar Pen 20 units in the am and 10 units at night     levothyroxine (SYNTHROID) 75 MCG tablet Take 75 mcg by mouth every morning.     losartan (COZAAR) 100 MG tablet Take 50 mg by mouth 2  (two) times daily.     montelukast (SINGULAIR) 10 MG tablet Take 10 mg by mouth daily after breakfast.      Multiple Vitamin (MULTIVITAMIN) capsule Taking 2 gummies by mouth daily     ONE TOUCH ULTRA TEST test strip      pantoprazole (PROTONIX) 40 MG tablet Take 40 mg by mouth 2 (two) times daily.      PROAIR HFA 108 (90 BASE) MCG/ACT inhaler Inhale 2 puffs into the lungs every 6 (six) hours as needed for wheezing or shortness of breath.      psyllium (METAMUCIL) 58.6 % powder Taking 1 tsp by mouth twice daily     rosuvastatin (CRESTOR) 20 MG tablet Take 1 tablet (20 mg total) by mouth daily. MUST KEEP APPT 05/2022 FOR FURTHER REFILLS 90 tablet 0   triamcinolone cream (KENALOG) 0.1 % Apply 1 application topically daily as needed (skin irritation).      No current facility-administered medications on file prior to visit.    Allergies  Allergen Reactions   Doxycycline Shortness Of Breath   Aspirin Other (See Comments)    Stomach pain   Ciprofloxacin Nausea And Vomiting   Codeine Nausea And Vomiting   Duloxetine Hcl Nausea And Vomiting   Nitrofuran Derivatives    Penicillins Swelling    THROAT Has patient had a PCN reaction causing immediate rash, facial/tongue/throat swelling, SOB or lightheadedness with hypotension: yes Has patient had a PCN reaction causing severe rash involving mucus membranes or skin necrosis: no Has patient had a PCN reaction that required hospitalization: no Has patient had a PCN reaction occurring within the last 10 years: no If all of the above answers are "NO", then may proceed with Cephalosporin use.    Social History   Occupational History   Occupation: Retired    Fish farm manager: RETIRED  Tobacco Use   Smoking status: Former    Types: Cigarettes    Quit date: 03/01/1974    Years since quitting: 48.3   Smokeless tobacco: Never   Tobacco comments:    Former smoker 03/04/2021  Vaping Use   Vaping Use: Never used  Substance and Sexual Activity   Alcohol  use: Yes    Alcohol/week: 2.0 standard drinks of alcohol    Types: 2 Standard drinks or equivalent per week    Comment: rare   Drug use: No   Sexual activity: Never   Family History  Problem Relation Age of Onset   Hypertension Mother    Diabetes Mother    Hypertension Father    Diabetes  Father    Heart disease Father    Prostate cancer Father    Diabetes Sister    Heart disease Brother    Prostate cancer Brother    Lung cancer Cousin    Colon cancer Neg Hx    Stomach cancer Neg Hx    Esophageal cancer Neg Hx    Pancreatic cancer Neg Hx    Liver disease Neg Hx    Immunization History  Administered Date(s) Administered   Influenza Split 02/13/2009, 01/21/2010, 01/08/2011, 01/22/2012, 01/24/2013, 02/20/2014   Influenza, High Dose Seasonal PF 02/24/2017, 01/18/2018   Influenza, Quadrivalent, Recombinant, Inj, Pf 11/29/2019, 12/07/2020   Influenza,inj,Quad PF,6+ Mos 12/31/2014   Influenza-Unspecified 02/25/2016   Moderna Sars-Covid-2 Vaccination 05/25/2019, 06/22/2019, 02/06/2020   Pneumococcal Conjugate-13 08/15/2013, 05/10/2014   Pneumococcal Polysaccharide-23 09/22/2011, 01/24/2013, 06/06/2015   Td,absorbed, Preservative Free, Adult Use, Lf Unspecified 09/22/2011, 01/24/2013   Tdap 02/14/2013, 03/02/2013   Zoster, Live 01/24/2013, 08/15/2013, 04/02/2014     Review of Systems: Negative except as noted in the HPI.   Objective: Vitals:   06/08/22 0931  BP: (!) 147/75   BREYON BAPST is a pleasant 82 y.o. female in NAD. AAO X 3.  Vascular Examination: Capillary refill time immediate b/l. Vascular status intact b/l with palpable pedal pulses. Pedal hair absent b/l. No edema. No pain with calf compression b/l. Skin temperature gradient WNL b/l.   Neurological Examination: Sensation grossly intact b/l with 10 gram monofilament. Vibratory sensation intact b/l.   Dermatological Examination: Pedal skin with normal turgor, texture and tone b/l.  No open wounds. No  interdigital macerations.   Toenails 1-5 b/l thick, discolored, elongated with subungual debris and pain on dorsal palpation.   No hyperkeratotic nor porokeratotic lesions present on today's visit. Well healed surgical scar noted dorsal aspect 1st MPJ right foot.  Musculoskeletal Examination: Muscle strength 5/5 to all lower extremity muscle groups bilaterally. Dorsal bunion right foot.  Radiographs: None  Footwear Assessment: Does the patient wear appropriate shoes? Yes. Does the patient need inserts/orthotics? No.  Lab Results  Component Value Date   HGBA1C 8.6 (H) 10/14/2016   ADA Risk Categorization: Low Risk :  Patient has all of the following: Intact protective sensation No prior foot ulcer  No severe deformity Pedal pulses present  Assessment: 1. Pain due to onychomycosis of toenail of right foot   2. Bunion, right foot   3. Acquired hammertoes of both feet   4. History of foot surgery   5. Diabetic peripheral neuropathy associated with type 2 diabetes mellitus ([place])   6. Encounter for diabetic foot exam [facility])     Plan: -Patient was evaluated and treated. All patient's and/or POA's questions/concerns answered on today's visit. -Diabetic foot examination performed today. -Continue diabetic foot care principles: inspect feet daily, monitor glucose as recommended by PCP and/or Endocrinologist, and follow prescribed diet per PCP, Endocrinologist and/or dietician. -Patient to continue soft, supportive shoe gear daily. -Toenails 1-5 b/l were debrided in length and girth with sterile nail nippers and dremel without iatrogenic bleeding.  -Discussed previous h/o foot surgery b/l bunionectomies and b/l 2nd hammertoe surgery. She has right foot pain and is concerned about screw being loose from right bunionectomy. -Patient referred to Dr. Lanae Crumbly for evaluation of right foot pain s/p bunion surgery and possible nail removal right 2nd toenail. -Patient/POA to call should  there be question/concern in the interim. Return in about 3 months (around 09/08/2022).  Marzetta Board, DPM

## 2022-06-22 ENCOUNTER — Other Ambulatory Visit: Payer: Self-pay | Admitting: Interventional Cardiology

## 2022-06-23 NOTE — Progress Notes (Unsigned)
Cardiology Office Note   Date:  06/24/2022   ID:  Jaime Boone, DOB Jul 07, 1940, MRN [id number]  PCP:  Prince Solian, MD    No chief complaint on file.  AFib, RF for CAD  Wt Readings from Last 3 Encounters:  06/24/22 199 lb 9.6 oz (90.5 kg)  04/10/22 202 lb 12.8 oz (92 kg)  12/24/21 213 lb (96.6 kg)       History of Present Illness: Jaime Boone is a 82 y.o. female  with  a history of DM, HTN, OSA, asthma, hep C, atrial fibrillation who presents for consultation in the [facility].  The patient was initially diagnosed with atrial fibrillation 01/13/21 after presenting to the ED with symptoms of shakiness, lightheadedness, and palpitations. EMS was called and ECG showed afib with RVR. She was started on a diltiazem drip in the ED and converted to SR. Patient was started on Eliquis for a CHADS2VASC score of 5 and diltiazem for rate control. Patient reports that she has had intermittent episodes of shakiness and lightheadedness since leaving the ED. She checked her blood glucose during these episodes and it was low. She ate something sweet and her symptoms resolved. She denies any bleeding issues on anticoagulation. She was diagnosed with OSA remotely but never started CPAP."   She has ben doing better with Diltiazem and Eliquis.  No bleeding problems.    No regular exercise.   Seen in AFib clinic in Jan 2024:"CHA2DS2-VASc score is 5, indicating a 7.2% annual risk of stroke.   Patient appears to be maintaining SR.  Continue diltiazem 180 mg daily.  Continue Eliquis 5 mg BID"  Denies : Chest pain. Dizziness. Leg edema. Nitroglycerin use. Orthopnea. Palpitations. Paroxysmal nocturnal dyspnea. Shortness of breath. Syncope.    Trying to walk more. Has a stationary bike.  Not up to the target below.    Past Medical History:  Diagnosis Date   Acid reflux    Acute bilateral knee pain    Arthritis    Asthma    Asthma    Atrial fibrillation (HCC)     Chest pain    Cirrhosis ([place])    Connective tissue disease ([place])    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   Degenerative joint disease    Diabetes mellitus without complication (HCC)    DJD (degenerative joint disease)    DM (diabetes mellitus) (HCC)    Fatigue    Fatty liver    GERD (gastroesophageal reflux disease)    Hepatitis C    History of hepatitis C    Hypertension    Jaw pain    OSA (obstructive sleep apnea)    Sleep apnea    Tiredness     Past Surgical History:  Procedure Laterality Date   ABDOMINAL HYSTERECTOMY     TAH   APPENDECTOMY     DILATION AND CURETTAGE OF UTERUS     EYE SURGERY Left    CATARACT   FOOT SURGERY Bilateral    BUNIONECTOMY   GALLBLADDER SURGERY     keiloid removal      ear   NOSE SURGERY     oral stone extraction       Current Outpatient Medications  Medication Sig Dispense Refill   apixaban (ELIQUIS) 5 MG TABS tablet Take 1 tablet (5 mg total) by mouth 2 (two) times daily. 60 tablet 0   Ascorbic Acid (VITAMIN C PO) Take 1 tablet by mouth every morning.     B-D  ULTRAFINE III SHORT PEN 31G X 8 MM MISC SMARTSIG:2 Needle SUB-Q Daily     Calcium Carbonate-Vitamin D 600-400 MG-UNIT tablet Take 1 tablet by mouth daily.     cetirizine (ZYRTEC) 10 MG tablet Take 10 mg by mouth daily after breakfast.     Continuous Blood Gluc Receiver ([place]) DEVI Use to test blood sugars continuously     Continuous Blood Gluc Sensor (DEXCOM G7 SENSOR) MISC Use to test blood sugars continuously. Change sensor every 10 days     diclofenac Sodium (VOLTAREN) 1 % GEL Apply topically as needed.     diltiazem (CARDIZEM CD) 180 MG 24 hr capsule TAKE 1 CAPSULE(180 MG) BY MOUTH DAILY 90 capsule 1   escitalopram (LEXAPRO) 5 MG tablet Take 5 mg by mouth at bedtime.     fluticasone furoate-vilanterol (BREO ELLIPTA) 100-25 MCG/INH AEPB 1 puff by mouth daily     glipiZIDE (GLUCOTROL) 5 MG tablet TAKE 2 TABLETS BY MOUTH EVERY MORNING AND 1 TABLET EVERY EVENING      Homeopathic Products (THERAWORX RELIEF) FOAM Apply topically as needed (for pain).     Lancets (ONETOUCH DELICA PLUS 123XX123) MISC U TO TEST BS BID     LANTUS SOLOSTAR 100 UNIT/ML Solostar Pen 20 units in the am and 10 units at night     levothyroxine (SYNTHROID) 75 MCG tablet Take 75 mcg by mouth every morning.     losartan (COZAAR) 100 MG tablet Take 50 mg by mouth 2 (two) times daily.     montelukast (SINGULAIR) 10 MG tablet Take 10 mg by mouth daily after breakfast.      Multiple Vitamin (MULTIVITAMIN) capsule Taking 2 gummies by mouth daily     ONE TOUCH ULTRA TEST test strip      pantoprazole (PROTONIX) 40 MG tablet Take 40 mg by mouth 2 (two) times daily.      PROAIR HFA 108 (90 BASE) MCG/ACT inhaler Inhale 2 puffs into the lungs every 6 (six) hours as needed for wheezing or shortness of breath.      psyllium (METAMUCIL) 58.6 % powder Taking 1 tsp by mouth twice daily     rosuvastatin (CRESTOR) 20 MG tablet TAKE 1 TABLET(20 MG) BY MOUTH DAILY 90 tablet 0   triamcinolone cream (KENALOG) 0.1 % Apply 1 application topically daily as needed (skin irritation).      No current facility-administered medications for this visit.    Allergies:   Doxycycline, Aspirin, Ciprofloxacin, Codeine, Duloxetine hcl, Nitrofuran derivatives, and Penicillins    Social History:  The patient  reports that she quit smoking about 48 years ago. Her smoking use included cigarettes. She has never used smokeless tobacco. She reports current alcohol use of about 2.0 standard drinks of alcohol per week. She reports that she does not use drugs.   Family History:  The patient's family history includes Diabetes in her father, mother, and sister; Heart disease in her brother and father; Hypertension in her father and mother; Lung cancer in her cousin; Prostate cancer in her brother and father.    ROS:  Please see the history of present illness.   Otherwise, review of systems are positive for intentional weight loss.    All other systems are reviewed and negative.    PHYSICAL EXAM: VS:  BP 132/68   Pulse 74   Ht 5\' 3"  (1.6 m)   Wt 199 lb 9.6 oz (90.5 kg)   SpO2 96%   BMI 35.36 kg/m  , BMI Body mass  index is 35.36 kg/m. GEN: Well nourished, well developed, in no acute distress HEENT: normal Neck: no JVD, carotid bruits, or masses Cardiac: RRR; no murmurs, rubs, or gallops,no edema  Respiratory:  clear to auscultation bilaterally, normal work of breathing GI: soft, nontender, nondistended, + BS MS: no deformity or atrophy Skin: warm and dry, no rash Neuro:  Strength and sensation are intact Psych: euthymic mood, full affect   EKG:   The ekg ordered January 2024 demonstrates normal sinus rhythm, nonspecific ST-T wave change   Recent Labs: No results found for requested labs within last 365 days.   Lipid Panel No results found for: "CHOL", "TRIG", "HDL", "CHOLHDL", "VLDL", "LDLCALC", "LDLDIRECT"   Other studies Reviewed: Additional studies/ records that were reviewed today with results demonstrating: labs reviewed.   ASSESSMENT AND PLAN:  AFib: Eliquis for stroke prevention. No bleeding issues. Rare palpitations.  In NSR on last ECG.  In sinus rhythm today Former smoker: Avoid all tobacco products RF for CAD/HTN: No angina.  Healthy lifestyle discussed.  DM: A1C 7.9 in 1/24.  She reports 7.5 on a repeat. Morbid obesity: losing weight.  OSA: not using CPAP.  Does have some fatigue in the morning. Hyperlipidemia: Continue rosuvastatin 20 mg daily.  Lipids to be checked in July 2024.    Current medicines are reviewed at length with the patient today.  The patient concerns regarding her medicines were addressed.  The following changes have been made:  No change  Labs/ tests ordered today include:  No orders of the defined types were placed in this encounter.   Recommend 150 minutes/week of aerobic exercise Low fat, low carb, high fiber diet recommended  Disposition:   FU in 1  year   Signed, Larae Grooms, MD  06/24/2022 8:54 AM    [facility] [address], [place], [place]  [address] Phone: [phone]; Fax: [phone]

## 2022-06-24 ENCOUNTER — Ambulatory Visit: Payer: 59 | Attending: Interventional Cardiology | Admitting: Interventional Cardiology

## 2022-06-24 VITALS — BP 132/68 | HR 74 | Ht 63.0 in | Wt 199.6 lb

## 2022-06-24 DIAGNOSIS — E1159 Type 2 diabetes mellitus with other circulatory complications: Secondary | ICD-10-CM

## 2022-06-24 DIAGNOSIS — Z794 Long term (current) use of insulin: Secondary | ICD-10-CM

## 2022-06-24 DIAGNOSIS — I10 Essential (primary) hypertension: Secondary | ICD-10-CM

## 2022-06-24 DIAGNOSIS — I480 Paroxysmal atrial fibrillation: Secondary | ICD-10-CM | POA: Diagnosis not present

## 2022-06-24 DIAGNOSIS — E782 Mixed hyperlipidemia: Secondary | ICD-10-CM

## 2022-06-24 DIAGNOSIS — D6869 Other thrombophilia: Secondary | ICD-10-CM

## 2022-06-24 NOTE — Patient Instructions (Signed)
Medication Instructions:  Your physician recommends that you continue on your current medications as directed. Please refer to the Current Medication list given to you today.  *If you need a refill on your cardiac medications before your next appointment, please call your pharmacy*   Lab Work: none If you have labs (blood work) drawn today and your tests are completely normal, you will receive your results only by: MyChart Message (if you have MyChart) OR A paper copy in the mail If you have any lab test that is abnormal or we need to change your treatment, we will call you to review the results.   Testing/Procedures: none   Follow-Up: At [facility], you and your health needs are our priority.  As part of our continuing mission to provide you with exceptional heart care, we have created designated Provider Care Teams.  These Care Teams include your primary Cardiologist (physician) and Advanced Practice Providers (APPs -  Physician Assistants and Nurse Practitioners) who all work together to provide you with the care you need, when you need it.  We recommend signing up for the patient portal called "MyChart".  Sign up information is provided on this After Visit Summary.  MyChart is used to connect with patients for Virtual Visits (Telemedicine).  Patients are able to view lab/test results, encounter notes, upcoming appointments, etc.  Non-urgent messages can be sent to your provider as well.   To learn more about what you can do with MyChart, go to [email or web address].    Your next appointment:   12 month(s)  Provider:   Jayadeep Varanasi, MD     Other Instructions    

## 2022-07-09 ENCOUNTER — Ambulatory Visit (INDEPENDENT_AMBULATORY_CARE_PROVIDER_SITE_OTHER): Payer: 59

## 2022-07-09 ENCOUNTER — Ambulatory Visit (INDEPENDENT_AMBULATORY_CARE_PROVIDER_SITE_OTHER): Payer: 59 | Admitting: Podiatry

## 2022-07-09 DIAGNOSIS — L603 Nail dystrophy: Secondary | ICD-10-CM

## 2022-07-09 DIAGNOSIS — M19071 Primary osteoarthritis, right ankle and foot: Secondary | ICD-10-CM

## 2022-07-09 DIAGNOSIS — M79671 Pain in right foot: Secondary | ICD-10-CM

## 2022-07-09 DIAGNOSIS — T8484XA Pain due to internal orthopedic prosthetic devices, implants and grafts, initial encounter: Secondary | ICD-10-CM

## 2022-07-12 NOTE — Progress Notes (Signed)
  Subjective:  Patient ID: Jaime Boone, female    DOB: 28-Feb-1941,  MRN: [id number]  Chief Complaint  Patient presents with   Foot Pain    right foot xrays to check screw. She had surgery late 1990's in [place]. May also want right 2nd toenail removed.PER -DR Eloy End    82 y.o. female presents with the above complaint. History confirmed with patient.  Both issues are bothersome but not severely painful  Objective:  Physical Exam: warm, good capillary refill, no trophic changes or ulcerative lesions, normal DP and PT pulses, normal sensory exam, and palpable dorsal spur first MTP joint, limited range of motion, mild tenderness, dystrophic second toenail with thickening.   Radiographs: Multiple views x-ray of the right foot: Osteoarthritic changes of first metatarsal phalangeal joint, screw in position with good bony purchase, does not appear to be prominent Assessment:   1. Painful orthopaedic hardware   2. Osteoarthritis of first metatarsophalangeal (MTP) joint of right foot   3. Nail dystrophy      Plan:  Patient was evaluated and treated and all questions answered.  We reviewed her x-rays together.  We discussed that the pain she is having is likely due to osteoarthritis and the dorsal spur not the screw itself.  We discussed removal of both of these as well as treatment of the arthritis with arthrodesis.  We also discussed removal of the second toenail permanently if she is interested in this she will let me know.  Neither issue currently is lifestyle limiting and she would prefer to wait and hold off on surgery for both issues.  Return if symptoms worsen or fail to improve.

## 2022-09-20 ENCOUNTER — Other Ambulatory Visit: Payer: Self-pay | Admitting: Interventional Cardiology

## 2022-10-06 ENCOUNTER — Ambulatory Visit (INDEPENDENT_AMBULATORY_CARE_PROVIDER_SITE_OTHER): Payer: 59 | Admitting: Podiatry

## 2022-10-06 ENCOUNTER — Encounter: Payer: Self-pay | Admitting: Podiatry

## 2022-10-06 VITALS — BP 131/72

## 2022-10-06 DIAGNOSIS — B351 Tinea unguium: Secondary | ICD-10-CM | POA: Diagnosis not present

## 2022-10-06 DIAGNOSIS — M79674 Pain in right toe(s): Secondary | ICD-10-CM | POA: Diagnosis not present

## 2022-10-06 DIAGNOSIS — E1142 Type 2 diabetes mellitus with diabetic polyneuropathy: Secondary | ICD-10-CM | POA: Diagnosis not present

## 2022-10-06 NOTE — Progress Notes (Signed)
  Subjective:  Patient ID: Jaime Boone, female    DOB: 1940-05-08,  MRN: [id number]  Darci Needle presents to clinic today for: at risk foot care with history of diabetic neuropathy and painful elongated mycotic toenails 1-5 bilaterally which are tender when wearing enclosed shoe gear. Pain is relieved with periodic professional debridement.  Chief Complaint  Patient presents with   Nail Problem    dfc    PCP is Avva, Ravisankar, MD.  Allergies  Allergen Reactions   Doxycycline Shortness Of Breath   Aspirin Other (See Comments)    Stomach pain   Ciprofloxacin Nausea And Vomiting   Codeine Nausea And Vomiting   Duloxetine Hcl Nausea And Vomiting   Nitrofuran Derivatives    Penicillins Swelling    THROAT Has patient had a PCN reaction causing immediate rash, facial/tongue/throat swelling, SOB or lightheadedness with hypotension: yes Has patient had a PCN reaction causing severe rash involving mucus membranes or skin necrosis: no Has patient had a PCN reaction that required hospitalization: no Has patient had a PCN reaction occurring within the last 10 years: no If all of the above answers are "NO", then may proceed with Cephalosporin use.     Review of Systems: Negative except as noted in the HPI.  Objective: No changes noted in today's physical examination. Vitals:   10/06/22 1511  BP: 131/72   Jaime Boone is a pleasant 82 y.o. female in NAD. AAO x 3.  Vascular Examination: Capillary refill time <3 seconds b/l LE. Palpable pedal pulses b/l LE. Digital hair absent b/l. No pedal edema b/l. Skin temperature gradient WNL b/l. No varicosities b/l. No cyanosis or clubbing noted b/l LE.Marland Kitchen  Dermatological Examination: Pedal skin with normal turgor, texture and tone b/l. No open wounds. No interdigital macerations b/l. Toenails 1-5 b/l thickened, discolored, dystrophic with subungual debris. There is pain on palpation to dorsal aspect of nailplates.  No corns, calluses or  porokeratoses noted..  Neurological Examination: Protective sensation intact with 10 gram monofilament b/l LE. Vibratory sensation intact b/l LE.   Musculoskeletal Examination: Muscle strength 5/5 to all lower extremity muscle groups bilaterally. Dorsal bunion right 1st MPJ.  Assessment/Plan: 1. Pain due to onychomycosis of toenail of right foot   2. Diabetic peripheral neuropathy associated with type 2 diabetes mellitus (HCC)   -Consent given for treatment as described below: -Examined patient. -Continue supportive shoe gear daily. -Mycotic toenails 1-5 bilaterally were debrided in length and girth with sterile nail nippers and dremel without incident. -Patient/POA to call should there be question/concern in the interim.   Return in about 3 months (around 01/06/2023).  Freddie Breech, DPM

## 2022-12-04 ENCOUNTER — Emergency Department (HOSPITAL_COMMUNITY)
Admission: EM | Admit: 2022-12-04 | Discharge: 2022-12-04 | Disposition: A | Discharge status: Home / Self Care | Payer: 59 | Attending: Emergency Medicine | Admitting: Emergency Medicine

## 2022-12-04 ENCOUNTER — Encounter (HOSPITAL_COMMUNITY): Payer: Self-pay

## 2022-12-04 ENCOUNTER — Other Ambulatory Visit: Payer: Self-pay

## 2022-12-04 DIAGNOSIS — J45909 Unspecified asthma, uncomplicated: Secondary | ICD-10-CM | POA: Diagnosis not present

## 2022-12-04 DIAGNOSIS — Z7901 Long term (current) use of anticoagulants: Secondary | ICD-10-CM | POA: Insufficient documentation

## 2022-12-04 DIAGNOSIS — M542 Cervicalgia: Secondary | ICD-10-CM | POA: Diagnosis present

## 2022-12-04 DIAGNOSIS — Z79899 Other long term (current) drug therapy: Secondary | ICD-10-CM | POA: Insufficient documentation

## 2022-12-04 DIAGNOSIS — I10 Essential (primary) hypertension: Secondary | ICD-10-CM | POA: Diagnosis not present

## 2022-12-04 DIAGNOSIS — Z7951 Long term (current) use of inhaled steroids: Secondary | ICD-10-CM | POA: Insufficient documentation

## 2022-12-04 DIAGNOSIS — I4891 Unspecified atrial fibrillation: Secondary | ICD-10-CM | POA: Insufficient documentation

## 2022-12-04 DIAGNOSIS — E119 Type 2 diabetes mellitus without complications: Secondary | ICD-10-CM | POA: Insufficient documentation

## 2022-12-04 DIAGNOSIS — M62838 Other muscle spasm: Secondary | ICD-10-CM | POA: Insufficient documentation

## 2022-12-04 LAB — URINALYSIS, W/ REFLEX TO CULTURE (INFECTION SUSPECTED)
Bacteria, UA: NONE SEEN
Bilirubin Urine: NEGATIVE
Glucose, UA: 50 mg/dL — AB
Hgb urine dipstick: NEGATIVE
Ketones, ur: NEGATIVE mg/dL
Leukocytes,Ua: NEGATIVE
Nitrite: NEGATIVE
Protein, ur: NEGATIVE mg/dL
Specific Gravity, Urine: 1.004 — ABNORMAL LOW (ref 1.005–1.030)
pH: 6 (ref 5.0–8.0)

## 2022-12-04 LAB — COMPREHENSIVE METABOLIC PANEL
ALT: 32 U/L (ref 0–44)
AST: 52 U/L — ABNORMAL HIGH (ref 15–41)
Albumin: 3.6 g/dL (ref 3.5–5.0)
Alkaline Phosphatase: 72 U/L (ref 38–126)
Anion gap: 16 — ABNORMAL HIGH (ref 5–15)
BUN: 9 mg/dL (ref 8–23)
CO2: 19 mmol/L — ABNORMAL LOW (ref 22–32)
Calcium: 9.1 mg/dL (ref 8.9–10.3)
Chloride: 101 mmol/L (ref 98–111)
Creatinine, Ser: 1.06 mg/dL — ABNORMAL HIGH (ref 0.44–1.00)
GFR, Estimated: 53 mL/min — ABNORMAL LOW (ref >60)
Glucose, Bld: 291 mg/dL — ABNORMAL HIGH (ref 70–99)
Potassium: 4.7 mmol/L (ref 3.5–5.1)
Sodium: 136 mmol/L (ref 135–145)
Total Bilirubin: 0.9 mg/dL (ref 0.3–1.2)
Total Protein: 7.1 g/dL (ref 6.5–8.1)

## 2022-12-04 LAB — I-STAT VENOUS BLOOD GAS, ED
Acid-base deficit: 1 mmol/L (ref 0.0–2.0)
Bicarbonate: 24.6 mmol/L (ref 20.0–28.0)
Calcium, Ion: 1.19 mmol/L (ref 1.15–1.40)
HCT: 36 % (ref 36.0–46.0)
Hemoglobin: 12.2 g/dL (ref 12.0–15.0)
O2 Saturation: 90 %
Potassium: 3.9 mmol/L (ref 3.5–5.1)
Sodium: 136 mmol/L (ref 135–145)
TCO2: 26 mmol/L (ref 22–32)
pCO2, Ven: 45.9 mmHg (ref 44–60)
pH, Ven: 7.338 (ref 7.25–7.43)
pO2, Ven: 62 mmHg — ABNORMAL HIGH (ref 32–45)

## 2022-12-04 LAB — BETA-HYDROXYBUTYRIC ACID: Beta-Hydroxybutyric Acid: 0.16 mmol/L (ref 0.05–0.27)

## 2022-12-04 LAB — CBC WITH DIFFERENTIAL/PLATELET
Abs Immature Granulocytes: 0.02 10*3/uL (ref 0.00–0.07)
Basophils Absolute: 0.1 10*3/uL (ref 0.0–0.1)
Basophils Relative: 1 %
Eosinophils Absolute: 0.2 10*3/uL (ref 0.0–0.5)
Eosinophils Relative: 3 %
HCT: 37 % (ref 36.0–46.0)
Hemoglobin: 11.1 g/dL — ABNORMAL LOW (ref 12.0–15.0)
Immature Granulocytes: 0 %
Lymphocytes Relative: 16 %
Lymphs Abs: 1.2 10*3/uL (ref 0.7–4.0)
MCH: 20.2 pg — ABNORMAL LOW (ref 26.0–34.0)
MCHC: 30 g/dL (ref 30.0–36.0)
MCV: 67.4 fL — ABNORMAL LOW (ref 80.0–100.0)
Monocytes Absolute: 0.5 10*3/uL (ref 0.1–1.0)
Monocytes Relative: 7 %
Neutro Abs: 5.4 10*3/uL (ref 1.7–7.7)
Neutrophils Relative %: 73 %
Platelets: 234 10*3/uL (ref 150–400)
RBC: 5.49 MIL/uL — ABNORMAL HIGH (ref 3.87–5.11)
RDW: 18.4 % — ABNORMAL HIGH (ref 11.5–15.5)
WBC: 7.4 10*3/uL (ref 4.0–10.5)
nRBC: 0 % (ref 0.0–0.2)

## 2022-12-04 MED ORDER — LIDOCAINE 5 % EX PTCH: 1.0000 | MEDICATED_PATCH | CUTANEOUS | 0 refills | Status: AC

## 2022-12-04 MED ORDER — LIDOCAINE 5 % EX PTCH
1.0000 | MEDICATED_PATCH | CUTANEOUS | Status: DC
  Administered 2022-12-04: 1 via TRANSDERMAL
  Filled 2022-12-04: qty 1

## 2022-12-04 MED ORDER — DEXAMETHASONE SODIUM PHOSPHATE 10 MG/ML IJ SOLN
6.0000 mg | Freq: Once | INTRAMUSCULAR | Status: AC
  Administered 2022-12-04: 6 mg via INTRAVENOUS
  Filled 2022-12-04: qty 1

## 2022-12-04 MED ORDER — DIAZEPAM 5 MG/ML IJ SOLN
2.5000 mg | Freq: Once | INTRAMUSCULAR | Status: AC
  Administered 2022-12-04: 2.5 mg via INTRAVENOUS
  Filled 2022-12-04: qty 2

## 2022-12-04 MED ORDER — KETOROLAC TROMETHAMINE 30 MG/ML IJ SOLN
30.0000 mg | Freq: Once | INTRAMUSCULAR | Status: AC
  Administered 2022-12-04: 30 mg via INTRAVENOUS
  Filled 2022-12-04: qty 1

## 2022-12-04 MED ORDER — CYCLOBENZAPRINE HCL 5 MG PO TABS: 5.0000 mg | ORAL_TABLET | Freq: Three times a day (TID) | ORAL | 0 refills | Status: DC | PRN

## 2022-12-04 NOTE — ED Provider Notes (Signed)
[place] EMERGENCY DEPARTMENT AT [facility] Provider Note   CSN: [id number] Arrival date & time: 12/04/22  4098     History {Add pertinent medical, surgical, social history, OB history to HPI:1} Chief Complaint  Patient presents with   Spasms    Jaime Boone is a 82 y.o. female.  HPI     82 year old female with a history of diabetes, atrial fibrillation on Eliquis, cirrhosis, asthma, OSA, hypertension who presents with concern for neck pain and spasm.  Reports she woke up this morning and is unable to turn her head or move her neck due to pain and spasm of the left side hand side.  She tried using an over-the-counter muscle relaxant spray without any relief.  Reports the pain is severe.  She is never had pain like this in her neck before.  Does report she has had prior neck spasm that has improved at home.  She denies any trauma.  She did just celebrate her 82th birthday party with family from out of town and reports that she was lifting her large 53-month-old great grandchild the day before yesterday and briefly yesterday.  She reports she is not sure if she should have been lifting the child, however the baby was so cute she couldn't help herself.  Denies chest pain, shortness of breath, acute numbness, weakness, fever, abdominal pain.  The pain is located on the left side, and increases with any movement, making it difficult for her to turn her head.   Past Medical History:  Diagnosis Date   Acid reflux    Acute bilateral knee pain    Arthritis    Asthma    Asthma    Atrial fibrillation (HCC)    Chest pain    Cirrhosis (HCC)    Connective tissue disease (HCC)    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   Degenerative joint disease    Diabetes mellitus without complication (HCC)    DJD (degenerative joint disease)    DM (diabetes mellitus) (HCC)    Fatigue    Fatty liver    GERD (gastroesophageal reflux disease)    Hepatitis C    History of hepatitis C     Hypertension    Jaw pain    OSA (obstructive sleep apnea)    Sleep apnea    Tiredness      Home Medications Prior to Admission medications   Medication Sig Start Date End Date Taking? Authorizing Provider  apixaban (ELIQUIS) 5 MG TABS tablet Take 1 tablet (5 mg total) by mouth 2 (two) times daily. 01/13/21   Tegeler, Canary Brim, MD  Ascorbic Acid (VITAMIN C PO) Take 1 tablet by mouth every morning.    [provider]  B-D ULTRAFINE III SHORT PEN 31G X 8 MM MISC SMARTSIG:2 Needle SUB-Q Daily 06/09/19   [provider]  Calcium Carbonate-Vitamin D 600-400 MG-UNIT tablet Take 1 tablet by mouth daily.    [provider]  cetirizine (ZYRTEC) 10 MG tablet Take 10 mg by mouth daily after breakfast.    [provider]  Continuous Blood Gluc Receiver (DEXCOM G7 RECEIVER) DEVI Use to test blood sugars continuously 02/17/22   [provider]  Continuous Blood Gluc Sensor (DEXCOM G7 SENSOR) MISC Use to test blood sugars continuously. Change sensor every 10 days 02/17/22   [provider]  diclofenac Sodium (VOLTAREN) 1 % GEL Apply topically as needed.    [provider]  diltiazem (CARDIZEM CD) 180 MG 24 hr capsule TAKE  1 CAPSULE(180 MG) BY MOUTH DAILY 03/24/22   Fenton, Clint R, PA  escitalopram (LEXAPRO) 5 MG tablet Take 5 mg by mouth at bedtime. 02/18/22   [provider]  fluticasone furoate-vilanterol (BREO ELLIPTA) 100-25 MCG/INH AEPB 1 puff by mouth daily 07/23/17   [provider]  glipiZIDE (GLUCOTROL) 5 MG tablet TAKE 2 TABLETS BY MOUTH EVERY MORNING AND 1 TABLET EVERY EVENING 06/27/19   [provider]  Homeopathic Products [facility] RELIEF) FOAM Apply topically as needed (for pain).    [provider]  Lancets (ONETOUCH DELICA PLUS [place] Bull[place]) MISC U TO TEST BS BID 05/15/18   [provider]  LANTUS SOLOSTAR 100 UNIT/ML Solostar Pen 20 units in the am and 10 units at night 02/27/19    [provider]  levothyroxine (SYNTHROID) 75 MCG tablet Take 75 mcg by mouth every morning. 01/17/21   [provider]  losartan (COZAAR) 100 MG tablet Take 50 mg by mouth 2 (two) times daily. 06/26/19   [provider]  montelukast (SINGULAIR) 10 MG tablet Take 10 mg by mouth daily after breakfast.     [provider]  Multiple Vitamin (MULTIVITAMIN) capsule Taking 2 gummies by mouth daily    [provider]  ONE TOUCH ULTRA TEST test strip  04/03/13   [provider]  pantoprazole (PROTONIX) 40 MG tablet Take 40 mg by mouth 2 (two) times daily.     [provider]  PROAIR HFA 108 (90 BASE) MCG/ACT inhaler Inhale 2 puffs into the lungs every 6 (six) hours as needed for wheezing or shortness of breath.  04/19/13   [provider]  psyllium (METAMUCIL) 58.6 % powder Taking 1 tsp by mouth twice daily    [provider]  rosuvastatin (CRESTOR) 20 MG tablet TAKE 1 TABLET(20 MG) BY MOUTH DAILY 09/21/22   Corky Crafts, MD  triamcinolone cream (KENALOG) 0.1 % Apply 1 application topically daily as needed (skin irritation).  09/18/13   [provider]      Allergies    Doxycycline, Aspirin, Ciprofloxacin, Codeine, Duloxetine hcl, Nitrofuran derivatives, and Penicillins    Review of Systems   Review of Systems  Physical Exam Updated Vital Signs There were no vitals taken for this visit. Physical Exam  ED Results / Procedures / Treatments   Labs (all labs ordered are listed, but only abnormal results are displayed) Labs Reviewed - No data to display  EKG None  Radiology No results found.  Procedures Procedures  {Document cardiac monitor, telemetry assessment procedure when appropriate:1}  Medications Ordered in ED Medications - No data to display  ED Course/ Medical Decision Making/ A&P   {   Click here for ABCD2, HEART and other calculatorsREFRESH Note before signing :1}                                82 year old female with a history of diabetes, atrial fibrillation on Eliquis, cirrhosis, asthma, OSA, hypertension who presents with concern for neck pain and spasm.  She clinically has spasm on exam, with tenderness, pain with movements, do not suspect that this neck pain represents acute intrathoracic, intracranial or intra-abdominal process.  No trauma and doubt fractures.  Due to underlying medical problems, will evaluate labs to look for electrolyte abnormalities.  Ordered IV Valium, Toradol and Decadron as well as lidocaine patch for symptom relief.   Labs completed and evalauted by me showing ***  {Document  critical care time when appropriate:1} {Document review of labs and clinical decision tools ie heart score, Chads2Vasc2 etc:1}  {Document your independent review of radiology images, and any outside records:1} {Document your discussion with family members, caretakers, and with consultants:1} {Document social determinants of health affecting pt's care:1} {Document your decision making why or why not admission, treatments were needed:1} Final Clinical Impression(s) / ED Diagnoses Final diagnoses:  None    Rx / DC Orders ED Discharge Orders     None

## 2022-12-04 NOTE — ED Notes (Signed)
Pt assisted to restroom via wheelchair; pt states she is feeling better

## 2022-12-04 NOTE — ED Triage Notes (Signed)
Pt BIBPTAR for neck pain and muscle spasms, thinks it is related to playing with her great grand children.   371 CBG

## 2022-12-04 NOTE — ED Notes (Signed)
Patient verbalizes understanding of discharge instructions. Opportunity for questioning and answers were provided. 

## 2022-12-04 NOTE — ED Notes (Signed)
Pt arranging for a ride home.

## 2022-12-04 NOTE — ED Notes (Signed)
Pt received dc papers and was taken out to family vehicle in [place]. Pt ambulated to car with steady gait. Pt a&ox4,vss,nad.

## 2022-12-14 ENCOUNTER — Other Ambulatory Visit (HOSPITAL_COMMUNITY): Payer: Self-pay | Admitting: *Deleted

## 2022-12-14 MED ORDER — DILTIAZEM HCL ER COATED BEADS 180 MG PO CP24: 180.0000 mg | ORAL_CAPSULE | Freq: Every day | ORAL | 2 refills | Status: DC

## 2023-04-14 ENCOUNTER — Ambulatory Visit (HOSPITAL_COMMUNITY): Payer: 59 | Admitting: Physician Assistant

## 2023-05-11 ENCOUNTER — Ambulatory Visit (HOSPITAL_COMMUNITY): Payer: 59 | Admitting: Physician Assistant

## 2023-05-31 ENCOUNTER — Ambulatory Visit (HOSPITAL_COMMUNITY)
Admission: RE | Admit: 2023-05-31 | Discharge: 2023-05-31 | Disposition: A | Discharge status: Home / Self Care | Payer: 59 | Source: Ambulatory Visit | Attending: Physician Assistant | Admitting: Physician Assistant

## 2023-05-31 ENCOUNTER — Encounter (HOSPITAL_COMMUNITY): Payer: Self-pay | Admitting: Physician Assistant

## 2023-05-31 VITALS — BP 130/82 | HR 66 | Ht 63.0 in | Wt 193.6 lb

## 2023-05-31 DIAGNOSIS — E669 Obesity, unspecified: Secondary | ICD-10-CM | POA: Insufficient documentation

## 2023-05-31 DIAGNOSIS — Z6834 Body mass index (BMI) 34.0-34.9, adult: Secondary | ICD-10-CM | POA: Diagnosis not present

## 2023-05-31 DIAGNOSIS — I480 Paroxysmal atrial fibrillation: Secondary | ICD-10-CM | POA: Insufficient documentation

## 2023-05-31 DIAGNOSIS — Z8619 Personal history of other infectious and parasitic diseases: Secondary | ICD-10-CM | POA: Diagnosis not present

## 2023-05-31 DIAGNOSIS — G4733 Obstructive sleep apnea (adult) (pediatric): Secondary | ICD-10-CM | POA: Insufficient documentation

## 2023-05-31 DIAGNOSIS — Z7901 Long term (current) use of anticoagulants: Secondary | ICD-10-CM | POA: Insufficient documentation

## 2023-05-31 DIAGNOSIS — J45909 Unspecified asthma, uncomplicated: Secondary | ICD-10-CM | POA: Diagnosis not present

## 2023-05-31 DIAGNOSIS — D6869 Other thrombophilia: Secondary | ICD-10-CM | POA: Insufficient documentation

## 2023-05-31 DIAGNOSIS — E119 Type 2 diabetes mellitus without complications: Secondary | ICD-10-CM | POA: Diagnosis not present

## 2023-05-31 DIAGNOSIS — Z7984 Long term (current) use of oral hypoglycemic drugs: Secondary | ICD-10-CM | POA: Diagnosis not present

## 2023-05-31 DIAGNOSIS — I10 Essential (primary) hypertension: Secondary | ICD-10-CM | POA: Diagnosis not present

## 2023-05-31 DIAGNOSIS — Z794 Long term (current) use of insulin: Secondary | ICD-10-CM | POA: Diagnosis not present

## 2023-05-31 NOTE — Progress Notes (Signed)
 Primary Care Physician: Chilton Greathouse, MD Primary Cardiologist: Dr Eldridge Dace Primary Electrophysiologist: none Referring Physician: [facility]   Jaime Boone is a 83 y.o. female with a history of DM, HTN, OSA, asthma, hep C, atrial fibrillation who presents for follow up in the [facility].  The patient was initially diagnosed with atrial fibrillation 01/13/21 after presenting to the ED with symptoms of shakiness, lightheadedness, and palpitations. EMS was called and ECG showed afib with RVR. She was started on a diltiazem drip in the ED and converted to SR. Patient was started on Eliquis for a CHADS2VASC score of 5 and diltiazem for rate control. Patient reports that she has had intermittent episodes of shakiness and lightheadedness since leaving the ED. She checked her blood glucose during these episodes and it was low. She ate something sweet and her symptoms resolved. She was diagnosed with OSA remotely but never started CPAP.  Patient returns for follow up for atrial fibrillation. She reports that she has done well since her last visit. She will rarely have a palpitation lasting 2-3 seconds when laying down at night but no sustained symptoms. No bleeding issues on anticoagulation.   Today, he denies symptoms of chest pain, shortness of breath, orthopnea, PND, lower extremity edema, dizziness, presyncope, syncope, snoring, daytime somnolence, bleeding, or neurologic sequela. The patient is tolerating medications without difficulties and is otherwise without complaint today.    Atrial Fibrillation Risk Factors:  she does have symptoms or diagnosis of sleep apnea. she does not have a history of rheumatic fever. she does not have a history of alcohol use.   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 60 tablet 0   Ascorbic Acid (VITAMIN C PO) Take 1 tablet by mouth every morning.     B-D ULTRAFINE III SHORT PEN 31G X 8 MM MISC SMARTSIG:2 Boone SUB-Q Daily     Calcium Carbonate-Vitamin D 600-400 MG-UNIT tablet Take 1 tablet by mouth daily.     cetirizine (ZYRTEC) 10 MG tablet Take 10 mg by mouth daily after breakfast.     Cholecalciferol 50 MCG (2000 UT) CAPS 1 capsule Orally Once a day     Continuous Blood Gluc Receiver (DEXCOM G7 RECEIVER) DEVI Use to test blood sugars continuously     Continuous Blood Gluc Sensor (DEXCOM G7 SENSOR) MISC Use to test blood sugars continuously. Change sensor every 10 days  cyclobenzaprine (FLEXERIL) 5 MG tablet Take 1 tablet (5 mg total) by mouth 3 (three) times daily as needed for muscle spasms. 15 tablet 0   diclofenac Sodium (VOLTAREN) 1 % GEL Apply topically as needed.     diltiazem (CARDIZEM CD) 180 MG 24 hr capsule Take 1 capsule (180 mg total) by mouth daily. 90 capsule 2   escitalopram (LEXAPRO) 5 MG tablet Take 5 mg by mouth at bedtime.     fluticasone furoate-vilanterol (BREO ELLIPTA) 100-25 MCG/INH AEPB 1 puff by mouth daily     Homeopathic Products (THERAWORX RELIEF) FOAM Apply topically as needed (for  pain).     JARDIANCE 25 MG TABS tablet Take 25 mg by mouth daily.     Lancets (ONETOUCH DELICA PLUS LANCET33G) MISC U TO TEST BS BID     LANTUS SOLOSTAR 100 UNIT/ML Solostar Pen 20 units in the am and 10 units at night     levothyroxine (SYNTHROID) 75 MCG tablet Take 75 mcg by mouth every morning.     lidocaine (LIDODERM) 5 % Place 1 patch onto the skin daily. Remove & Discard patch within 12 hours or as directed by MD 30 patch 0   montelukast (SINGULAIR) 10 MG tablet Take 10 mg by mouth daily after breakfast.      Multiple Vitamin (MULTIVITAMIN) capsule Taking 2 gummies by mouth daily     ONE TOUCH ULTRA TEST test strip      pantoprazole (PROTONIX) 40 MG tablet Take 40 mg by mouth 2 (two) times daily.      PROAIR HFA 108 (90 BASE) MCG/ACT inhaler Inhale 2 puffs into the lungs every 6 (six) hours as needed for wheezing or shortness of breath.      psyllium (METAMUCIL) 58.6 % powder Taking 1 tsp by mouth twice daily     rosuvastatin (CRESTOR) 20 MG tablet TAKE 1 TABLET(20 MG) BY MOUTH DAILY 90 tablet 3   triamcinolone cream (KENALOG) 0.1 % Apply 1 application topically daily as needed (skin irritation).      No current facility-administered medications for this encounter.    ROS- All systems are reviewed and negative except as per the HPI above.  Physical Exam: Vitals:   05/31/23 0845  BP: 130/82  Pulse: 66  Weight: 87.8 kg  Height: 5\' 3"  (1.6 m)    GEN: Well nourished, well developed in no acute distress CARDIAC: Regular rate and rhythm, no murmurs, rubs, gallops RESPIRATORY:  Clear to auscultation without rales, wheezing or rhonchi  ABDOMEN: Soft, non-tender, non-distended EXTREMITIES:  No edema; No deformity    Wt Readings from Last 3 Encounters:  05/31/23 87.8 kg  06/24/22 90.5 kg  04/10/22 92 kg    EKG today demonstrates  SR, NST Vent. rate 66 BPM PR interval 154 ms QRS duration 68 ms QT/QTcB 286/299 ms   Echo 02/28/21 demonstrated  1. Left ventricular ejection  fraction, by estimation, is 60 to 65%. The  left ventricle has normal function. The left ventricle has no regional  wall motion abnormalities. There is mild concentric left ventricular  hypertrophy. Left ventricular diastolic parameters are consistent with Grade I diastolic dysfunction (impaired relaxation).   2. Right ventricular systolic function is normal. The right ventricular  size is normal. There is normal pulmonary artery systolic pressure. The estimated right ventricular systolic pressure is 25.8 mmHg.   3. The mitral valve is normal in structure. Trivial mitral valve  regurgitation. No evidence of mitral stenosis.   4. The aortic valve is normal in  structure. Aortic valve regurgitation is  not visualized. No aortic stenosis is present.   5. The inferior vena cava is normal in size with greater than 50%  respiratory variability, suggesting right atrial pressure of 3 mmHg.   Epic records are reviewed at length today  CHA2DS2-VASc Score = 5  The patient's score is based upon: CHF History: 0 HTN History: 1 Diabetes History: 1 Stroke History: 0 Vascular Disease History: 0 Age Score: 2 Gender Score: 1       ASSESSMENT AND PLAN: Paroxysmal Atrial Fibrillation (ICD10:  I48.0) The patient's CHA2DS2-VASc score is 5, indicating a 7.2% annual risk of stroke.   Patient appears to be maintaining SR Continue diltiazem 180 mg daily Continue Eliquis 5 mg BID  Secondary Hypercoagulable State (ICD10:  D68.69) The patient is at significant risk for stroke/thromboembolism based upon her CHA2DS2-VASc Score of 5.  Continue Apixaban (Eliquis). No bleeding issues on anticoagulation.   Obesity Body mass index is 34.29 kg/m.  Encouraged lifestyle modification  OSA  Not currently on CPAP  HTN Stable on current regimen   Follow up with Dr Izora Ribas as scheduled. AF clinic as needed.    Jorja Loa PA-C Afib Clinic [facility] [address] [place], [place]  [address] [phone] 05/31/2023 9:07 AM

## 2023-06-25 ENCOUNTER — Ambulatory Visit: Payer: 59 | Attending: Internal Medicine | Admitting: Internal Medicine

## 2023-06-25 VITALS — BP 126/68 | HR 75 | Ht 63.0 in | Wt 192.0 lb

## 2023-06-25 DIAGNOSIS — R011 Cardiac murmur, unspecified: Secondary | ICD-10-CM | POA: Diagnosis not present

## 2023-06-25 DIAGNOSIS — G4730 Sleep apnea, unspecified: Secondary | ICD-10-CM

## 2023-06-25 DIAGNOSIS — I10 Essential (primary) hypertension: Secondary | ICD-10-CM

## 2023-06-25 DIAGNOSIS — D6869 Other thrombophilia: Secondary | ICD-10-CM

## 2023-06-25 DIAGNOSIS — I480 Paroxysmal atrial fibrillation: Secondary | ICD-10-CM

## 2023-06-25 DIAGNOSIS — E119 Type 2 diabetes mellitus without complications: Secondary | ICD-10-CM

## 2023-06-25 NOTE — Progress Notes (Signed)
 Cardiology Office Note:  .    Date:  06/25/2023  ID:  Jaime Boone, DOB [birth year], MRN [id number] PCP: Chilton Greathouse, MD   HeartCare Providers Cardiologist:  Lance Muss, MD     CC: Transition to new cardiologist  History of Present Illness: .    Jaime Boone is a 83 y.o. female with atrial fibrillation, coronary artery disease, and hypertension who presents to establish care. She was previously managed by Dr. Everette Rank.  She has a history of atrial fibrillation and is currently on Eliquis 5 mg PO BID for anticoagulation. Her heart rate is controlled with diltiazem. She experiences palpitations and shortness of breath during episodes of atrial fibrillation, which led to her being placed on a blood thinner and another medication by [place]. [place]. She notices her heart beating noticeably when lying on her left side or flat on her back, but not during activities like climbing stairs. A recent EKG with her podiatrist showed normal rhythm.  For coronary artery disease, she is on rosuvastatin, and her cholesterol levels were reported as excellent in her last labs on September 28, 2022.  Her hypertension is well-controlled, and she tries to exercise by doing stairs daily. She feels mentally well and denies being easily stressed.  She is on Jardiance for diabetes, and her blood sugars have been elevated but are improving.  She admits to overeating but states she takes her medications as prescribed and gets plenty of rest. She describes herself as generally active and in good spirits.   Relevant histories: .  Social  preference is no medication ROS: As per HPI.   Studies Reviewed: .   Cardiac Studies & Procedures   ______________________________________________________________________________________________     ECHOCARDIOGRAM  ECHOCARDIOGRAM COMPLETE 02/28/2021  Narrative ECHOCARDIOGRAM REPORT    Patient Name:   Jaime Boone Date of Exam: 02/28/2021 Medical  Rec #:  [id number]      Height:       63.0 in Accession #:    [id number]     Weight:       218.6 lb Date of Birth:  02/17/1941       BSA:          2.008 m Patient Age:    80 years       BP:           148/86 mmHg Patient Gender: F              HR:           73 bpm. Exam Location:  Outpatient  Procedure: 2D Echo  Indications:    Atrial Fibrillation I48.91  History:        Patient has no prior history of Echocardiogram examinations. Risk Factors:Hypertension and Diabetes.  Sonographer:    Thurman Coyer RDCS Referring Phys: [id number] CLINT R [place]  IMPRESSIONS   1. Left ventricular ejection fraction, by estimation, is 60 to 65%. The left ventricle has normal function. The left ventricle has no regional wall motion abnormalities. There is mild concentric left ventricular hypertrophy. Left ventricular diastolic parameters are consistent with Grade I diastolic dysfunction (impaired relaxation). 2. Right ventricular systolic function is normal. The right ventricular size is normal. There is normal pulmonary artery systolic pressure. The estimated right ventricular systolic pressure is 25.8 mmHg. 3. The mitral valve is normal in structure. Trivial mitral valve regurgitation. No evidence of mitral stenosis. 4. The aortic valve is normal in structure. Aortic valve regurgitation is not visualized. No aortic stenosis  is present. 5. The inferior vena cava is normal in size with greater than 50% respiratory variability, suggesting right atrial pressure of 3 mmHg.  FINDINGS Left Ventricle: Left ventricular ejection fraction, by estimation, is 60 to 65%. The left ventricle has normal function. The left ventricle has no regional wall motion abnormalities. The left ventricular internal cavity size was normal in size. There is mild concentric left ventricular hypertrophy. Left ventricular diastolic parameters are consistent with Grade I diastolic dysfunction (impaired relaxation). Normal left ventricular  filling pressure.  Right Ventricle: The right ventricular size is normal. No increase in right ventricular wall thickness. Right ventricular systolic function is normal. There is normal pulmonary artery systolic pressure. The tricuspid regurgitant velocity is 2.39 m/s, and with an assumed right atrial pressure of 3 mmHg, the estimated right ventricular systolic pressure is 25.8 mmHg.  Left Atrium: Left atrial size was normal in size.  Right Atrium: Right atrial size was normal in size.  Pericardium: There is no evidence of pericardial effusion.  Mitral Valve: The mitral valve is normal in structure. Trivial mitral valve regurgitation. No evidence of mitral valve stenosis.  Tricuspid Valve: The tricuspid valve is normal in structure. Tricuspid valve regurgitation is mild . No evidence of tricuspid stenosis.  Aortic Valve: The aortic valve is normal in structure. Aortic valve regurgitation is not visualized. No aortic stenosis is present.  Pulmonic Valve: The pulmonic valve was normal in structure. Pulmonic valve regurgitation is trivial. No evidence of pulmonic stenosis.  Aorta: The aortic root is normal in size and structure.  Venous: The inferior vena cava is normal in size with greater than 50% respiratory variability, suggesting right atrial pressure of 3 mmHg.  IAS/Shunts: No atrial level shunt detected by color flow Doppler.   LEFT VENTRICLE PLAX 2D LVIDd:         4.20 cm   Diastology LVIDs:         3.10 cm   LV e' medial:    4.54 cm/s LV PW:         1.20 cm   LV E/e' medial:  13.9 LV IVS:        1.20 cm   LV e' lateral:   5.34 cm/s LVOT diam:     2.00 cm   LV E/e' lateral: 11.8 LV SV:         58 LV SV Index:   29 LVOT Area:     3.14 cm   RIGHT VENTRICLE RV S prime:     12.60 cm/s TAPSE (M-mode): 2.1 cm  LEFT ATRIUM             Index        RIGHT ATRIUM           Index LA diam:        3.40 cm 1.69 cm/m   RA Area:     14.00 cm LA Vol (A2C):   53.7 ml 26.74 ml/m  RA  Volume:   34.40 ml  17.13 ml/m LA Vol (A4C):   35.4 ml 17.63 ml/m LA Biplane Vol: 43.7 ml 21.76 ml/m AORTIC VALVE LVOT Vmax:   79.70 cm/s LVOT Vmean:  52.900 cm/s LVOT VTI:    0.186 m  AORTA Ao Root diam: 2.30 cm Ao Asc diam:  3.30 cm  MITRAL VALVE                TRICUSPID VALVE MV Area (PHT): 3.27 cm     TR Peak grad:   22.8 mmHg  MV Decel Time: 232 msec     TR Vmax:        239.00 cm/s MV E velocity: 62.90 cm/s MV A velocity: 100.00 cm/s  SHUNTS MV E/A ratio:  0.63         Systemic VTI:  0.19 m Systemic Diam: 2.00 cm  Armanda Magic MD Electronically signed by Armanda Magic MD Signature Date/Time: 02/28/2021/1:22:37 PM    Final          ______________________________________________________________________________________________      Physical Exam:    VS:  BP 126/68 (BP Location: Left Arm)   Pulse 75   Ht 5\' 3"  (1.6 m)   Wt 87.1 kg   SpO2 97%   BMI 34.01 kg/m    Wt Readings from Last 3 Encounters:  06/25/23 87.1 kg  05/31/23 87.8 kg  06/24/22 90.5 kg    Gen: no distress   Neck: No JVD Cardiac: No Rubs or Gallops, systolic murmur, RRR +2 radial pulses Respiratory: Clear to auscultation bilaterally, normal effort, normal  respiratory rate GI: Soft, nontender, non-distended  MS: No  edema;  moves all extremities Integument: Skin feels warm Neuro:  At time of evaluation, alert and oriented to person/place/time/situation  Psych: Normal affect, patient feels ok   ASSESSMENT AND PLAN: .    Atrial Fibrillation Paroxysmal atrial fibrillation with episodes of rapid heart rate and palpitations. Managed with Eliquis for anticoagulation and diltiazem for rate control. Recent EKG showed normal rhythm. No current symptoms, but potential for future episodes remains. Discussed lifestyle modifications such as exercise, blood pressure control, and managing sleep apnea to reduce episode frequency. Explained that breakthrough episodes can occur due to factors like illness  or thyroid issues. - Continue Eliquis 5 mg PO BID for anticoagulation; has mild stable anemia through OSH records review - Continue diltiazem for rate control. - Report any episodes of palpitations or rapid heart rate.  Obstructive Sleep Apnea Obstructive sleep apnea. Emphasized the role of treating sleep apnea in managing atrial fibrillation (in rhythm despite not doing CPAP)  Hyperlipidemia Hyperlipidemia managed with rosuvastatin, with excellent cholesterol levels in recent labs.  Hypertension Hypertension well-controlled with current management.  Diabetes Mellitus Diabetes mellitus managed with Jardiance as per primary  Follow-up - she has a heart murmur- will get echocardiogram - Schedule follow-up appointment in two year; she would like to see me PRN and we can change to this after the echo if she prefers  Riley Lam, MD FASE [facility] Cardiologist [facility]  [address], [address] [place], [place] [address] [phone]  9:26 AM

## 2023-06-25 NOTE — Patient Instructions (Signed)
 Medication Instructions:  Your physician recommends that you continue on your current medications as directed. Please refer to the Current Medication list given to you today.  *If you need a refill on your cardiac medications before your next appointment, please call your pharmacy*  Lab Work: NONE  If you have labs (blood work) drawn today and your tests are completely normal, you will receive your results only by: MyChart Message (if you have MyChart) OR A paper copy in the mail If you have any lab test that is abnormal or we need to change your treatment, we will call you to review the results.  Testing/Procedures: Your physician has requested that you have an echocardiogram. Echocardiography is a painless test that uses sound waves to create images of your heart. It provides your doctor with information about the size and shape of your heart and how well your heart's chambers and valves are working. This procedure takes approximately one hour. There are no restrictions for this procedure. Please do NOT wear cologne, perfume, aftershave, or lotions (deodorant is allowed). Please arrive 15 minutes prior to your appointment time.  Please note: We ask at that you not bring children with you during ultrasound (echo/ vascular) testing. Due to room size and safety concerns, children are not allowed in the ultrasound rooms during exams. Our front office staff cannot provide observation of children in our lobby area while testing is being conducted. An adult accompanying a patient to their appointment will only be allowed in the ultrasound room at the discretion of the ultrasound technician under special circumstances. We apologize for any inconvenience.   Follow-Up: At [facility], you and your health needs are our priority.  As part of our continuing mission to provide you with exceptional heart care, our providers are all part of one team.  This team includes your primary Cardiologist  (physician) and Advanced Practice Providers or APPs (Physician Assistants and Nurse Practitioners) who all work together to provide you with the care you need, when you need it.  Your next appointment:   2 year(s)  Provider:   Christell Constant, MD     We recommend signing up for the patient portal called "MyChart".  Sign up information is provided on this After Visit Summary.  MyChart is used to connect with patients for Virtual Visits (Telemedicine).  Patients are able to view lab/test results, encounter notes, upcoming appointments, etc.  Non-urgent messages can be sent to your provider as well.   To learn more about what you can do with MyChart, go to [email or web address].   Other Instructions       1st Floor: - Lobby - Registration  - Pharmacy  - Lab - Cafe  2nd Floor: - PV Lab - Diagnostic Testing (echo, CT, nuclear med)  3rd Floor: - Vacant  4th Floor: - TCTS (cardiothoracic surgery) - AFib Clinic - Structural Heart Clinic - Vascular Surgery  - Vascular Ultrasound  5th Floor: - HeartCare Cardiology (general and EP) - Clinical Pharmacy for coumadin, hypertension, lipid, weight-loss medications, and med management appointments    Valet parking services will be available as well.

## 2023-07-06 ENCOUNTER — Encounter: Payer: Self-pay | Admitting: Podiatry

## 2023-07-06 ENCOUNTER — Other Ambulatory Visit: Payer: Self-pay | Admitting: Adult Health

## 2023-07-06 ENCOUNTER — Ambulatory Visit (INDEPENDENT_AMBULATORY_CARE_PROVIDER_SITE_OTHER): Admitting: Podiatry

## 2023-07-06 DIAGNOSIS — N644 Mastodynia: Secondary | ICD-10-CM

## 2023-07-06 DIAGNOSIS — E119 Type 2 diabetes mellitus without complications: Secondary | ICD-10-CM | POA: Diagnosis not present

## 2023-07-06 DIAGNOSIS — M21611 Bunion of right foot: Secondary | ICD-10-CM | POA: Diagnosis not present

## 2023-07-06 DIAGNOSIS — E1142 Type 2 diabetes mellitus with diabetic polyneuropathy: Secondary | ICD-10-CM

## 2023-07-06 DIAGNOSIS — B351 Tinea unguium: Secondary | ICD-10-CM

## 2023-07-06 DIAGNOSIS — M5090 Cervical disc disorder, unspecified, unspecified cervical region: Secondary | ICD-10-CM | POA: Insufficient documentation

## 2023-07-06 DIAGNOSIS — M79674 Pain in right toe(s): Secondary | ICD-10-CM | POA: Diagnosis not present

## 2023-07-06 DIAGNOSIS — N6452 Nipple discharge: Secondary | ICD-10-CM

## 2023-07-06 DIAGNOSIS — Z7901 Long term (current) use of anticoagulants: Secondary | ICD-10-CM | POA: Insufficient documentation

## 2023-07-06 NOTE — Progress Notes (Signed)
 ANNUAL DIABETIC FOOT EXAM  Subjective: Jaime Boone presents today for annual diabetic foot exam.  Chief Complaint  Patient presents with   Diabetes    Patient states her A1c is 6.5, patient statesher PCP is Avva Ravisankar and she lasted seen him March 31   Patient confirms h/o diabetes.  Patient denies any h/o foot wounds.  Patient has been diagnosed with neuropathy.  Jaime Greathouse, MD is patient's PCP. LOV 06/28/2023.  Past Medical History:  Diagnosis Date   Acid reflux    Acute bilateral knee pain    Arthritis    Asthma    Asthma    Atrial fibrillation (HCC)    Chest pain    Cirrhosis (HCC)    Connective tissue disease (HCC)    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   Degenerative joint disease    Diabetes mellitus without complication (HCC)    DJD (degenerative joint disease)    DM (diabetes mellitus) (HCC)    Fatigue    Fatty liver    GERD (gastroesophageal reflux disease)    Hepatitis C    History of hepatitis C    Hypertension    Jaw pain    OSA (obstructive sleep apnea)    Sleep apnea    Tiredness    Patient Active Problem List   Diagnosis Date Noted   Anticoagulant long-term use 07/06/2023   Cervical disc disease 07/06/2023   Chronic kidney disease, stage 3a (HCC) 06/08/2022   Paroxysmal atrial fibrillation (HCC) 01/20/2021   Hypercoagulable state due to paroxysmal atrial fibrillation (HCC) 01/20/2021   Arthritis 12/10/2020   Hepatic fibrosis 12/10/2020   Irritable bowel syndrome 12/10/2020   Steatosis of liver 12/10/2020   Pain due to onychomycosis of toenail of right foot 07/14/2019   Palpitations 07/14/2019   Localized edema 04/08/2018   Headache 10/22/2016   Other specified disorders of kidney and ureter 10/22/2016   Acute hepatitis 10/12/2016   Hypertension 10/12/2016   Sleep apnea 10/12/2016   Type 2 diabetes mellitus (HCC) 10/12/2016   Menopause 03/16/2016   Hyperlipidemia 06/06/2015   Encounter for general adult medical  examination without abnormal findings 05/30/2015   Keratosis 04/17/2015   Vaginal lesion 03/14/2015   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   Morbid (severe) obesity due to excess calories (HCC) 08/23/2014   Diabetic retinopathy associated with type 2 diabetes mellitus (HCC) 05/10/2014   Cirrhosis of liver (HCC) 04/19/2013   Abdominal pain 04/19/2013   Hypothyroidism 01/24/2013   Anemia 05/31/2012   Sebaceous cyst of labia 04/06/2012   History of hepatitis C 04/06/2012   Constipation 04/06/2012   Postmenopausal 03/01/2012   Vaginal atrophy 03/01/2012   Chorioretinitis 01/21/2010   Gastro-esophageal reflux disease without esophagitis 12/20/2008   Seasonal allergic rhinitis 12/20/2008   Unspecified viral hepatitis C without hepatic coma 12/20/2008   Past Surgical History:  Procedure Laterality Date   ABDOMINAL HYSTERECTOMY     TAH   APPENDECTOMY     DILATION AND CURETTAGE OF UTERUS     EYE SURGERY Left    CATARACT   FOOT SURGERY Bilateral    BUNIONECTOMY   GALLBLADDER SURGERY     keiloid removal      ear   NOSE SURGERY     oral stone extraction     Current Outpatient Medications on File Prior to Visit  Medication Sig Dispense Refill   apixaban (ELIQUIS) 5 MG TABS tablet Take 1 tablet (5 mg total) by mouth 2 (two) times daily. 60 tablet 0   Ascorbic  Acid (VITAMIN C PO) Take 1 tablet by mouth every morning.     B-D ULTRAFINE III SHORT PEN 31G X 8 MM MISC SMARTSIG:2 Needle SUB-Q Daily     Calcium Carbonate-Vitamin D 600-400 MG-UNIT tablet Take 1 tablet by mouth daily.     cetirizine (ZYRTEC) 10 MG tablet Take 10 mg by mouth daily after breakfast.     Cholecalciferol 50 MCG (2000 UT) CAPS 1 capsule Orally Once a day     Continuous Blood Gluc Receiver (DEXCOM G7 RECEIVER) DEVI Use to test blood sugars continuously     Continuous Blood Gluc Sensor (DEXCOM G7 SENSOR) MISC Use to test blood sugars continuously. Change sensor every 10 days     cyclobenzaprine (FLEXERIL) 5 MG tablet  Take 1 tablet (5 mg total) by mouth 3 (three) times daily as needed for muscle spasms. 15 tablet 0   diclofenac Sodium (VOLTAREN) 1 % GEL Apply topically as needed.     diltiazem (CARDIZEM CD) 180 MG 24 hr capsule Take 1 capsule (180 mg total) by mouth daily. 90 capsule 2   escitalopram (LEXAPRO) 5 MG tablet Take 5 mg by mouth at bedtime.     fluticasone furoate-vilanterol (BREO ELLIPTA) 100-25 MCG/INH AEPB 1 puff by mouth daily     Homeopathic Products (THERAWORX RELIEF) FOAM Apply topically as needed (for pain).     JARDIANCE 25 MG TABS tablet Take 25 mg by mouth daily.     Lancets (ONETOUCH DELICA PLUS LANCET33G) MISC U TO TEST BS BID     LANTUS SOLOSTAR 100 UNIT/ML Solostar Pen 20 units in the am and 10 units at night     levothyroxine (SYNTHROID) 75 MCG tablet Take 75 mcg by mouth every morning.     lidocaine (LIDODERM) 5 % Place 1 patch onto the skin daily. Remove & Discard patch within 12 hours or as directed by MD 30 patch 0   montelukast (SINGULAIR) 10 MG tablet Take 10 mg by mouth daily after breakfast.      Multiple Vitamin (MULTIVITAMIN) capsule Taking 2 gummies by mouth daily     ONE TOUCH ULTRA TEST test strip      pantoprazole (PROTONIX) 40 MG tablet Take 40 mg by mouth 2 (two) times daily.      PROAIR HFA 108 (90 BASE) MCG/ACT inhaler Inhale 2 puffs into the lungs every 6 (six) hours as needed for wheezing or shortness of breath.      psyllium (METAMUCIL) 58.6 % powder Taking 1 tsp by mouth twice daily     rosuvastatin (CRESTOR) 20 MG tablet TAKE 1 TABLET(20 MG) BY MOUTH DAILY 90 tablet 3   triamcinolone cream (KENALOG) 0.1 % Apply 1 application topically daily as needed (skin irritation).      No current facility-administered medications on file prior to visit.    Allergies  Allergen Reactions   Doxycycline Shortness Of Breath   Aspirin Other (See Comments)    Stomach pain   Ciprofloxacin Nausea And Vomiting   Codeine Nausea And Vomiting   Duloxetine Hcl Nausea And  Vomiting   Nitrofuran Derivatives    Penicillins Swelling    THROAT Has patient had a PCN reaction causing immediate rash, facial/tongue/throat swelling, SOB or lightheadedness with hypotension: yes Has patient had a PCN reaction causing severe rash involving mucus membranes or skin necrosis: no Has patient had a PCN reaction that required hospitalization: no Has patient had a PCN reaction occurring within the last 10 years: no If all of the above answers  are "NO", then may proceed with Cephalosporin use.    Social History   Occupational History   Occupation: Retired    Associate Professor: RETIRED  Tobacco Use   Smoking status: Former    Current packs/day: 0.00    Types: Cigarettes    Quit date: 03/01/1974    Years since quitting: 49.3   Smokeless tobacco: Never   Tobacco comments:    Former smoker 03/04/2021  Vaping Use   Vaping status: Never Used  Substance and Sexual Activity   Alcohol use: Yes    Alcohol/week: 2.0 standard drinks of alcohol    Types: 2 Standard drinks or equivalent per week    Comment: rare   Drug use: No   Sexual activity: Never   Family History  Problem Relation Age of Onset   Hypertension Mother    Diabetes Mother    Hypertension Father    Diabetes Father    Heart disease Father    Prostate cancer Father    Diabetes Sister    Heart disease Brother    Prostate cancer Brother    Lung cancer Cousin    Colon cancer Neg Hx    Stomach cancer Neg Hx    Esophageal cancer Neg Hx    Pancreatic cancer Neg Hx    Liver disease Neg Hx    Immunization History  Administered Date(s) Administered   Influenza Split 02/13/2009, 01/21/2010, 01/08/2011, 01/22/2012, 01/24/2013, 02/20/2014   Influenza, High Dose Seasonal PF 02/24/2017, 01/18/2018   Influenza, Quadrivalent, Recombinant, Inj, Pf 11/29/2019, 12/07/2020   Influenza,inj,Quad PF,6+ Mos 12/31/2014   Influenza-Unspecified 02/25/2016   Moderna Sars-Covid-2 Vaccination 05/25/2019, 06/22/2019, 02/06/2020    Pneumococcal Conjugate-13 08/15/2013, 05/10/2014   Pneumococcal Polysaccharide-23 09/22/2011, 01/24/2013, 06/06/2015   Td,absorbed, Preservative Free, Adult Use, Lf Unspecified 09/22/2011, 01/24/2013   Tdap 02/14/2013, 03/02/2013   Zoster, Live 01/24/2013, 08/15/2013, 04/02/2014     Review of Systems: Negative except as noted in the HPI.   Objective: There were no vitals filed for this visit.  KINZEE HAPPEL is a pleasant 83 y.o. female in NAD. AAO X 3.  Diabetic foot exam was performed with the following findings:   Vascular Examination: Capillary refill time immediate b/l. Vascular status intact b/l with palpable pedal pulses. Pedal hair absent b/l. No pain with calf compression b/l. Skin temperature gradient WNL b/l. No cyanosis or clubbing b/l. No ischemia or gangrene noted b/l.   Neurological Examination: Sensation grossly intact b/l with 10 gram monofilament. Vibratory sensation intact b/l. Pt has subjective symptoms of neuropathy right foot.  Dermatological Examination: Well healed surgical scar(s) noted bilateral 1st MPJs. Pedal skin with normal turgor, texture and tone b/l.  No open wounds. No interdigital macerations.   Toenails 1-5 b/l thick, discolored, elongated with subungual debris and pain on dorsal palpation.   No hyperkeratotic nor porokeratotic lesions present on today's visit.  Musculoskeletal Examination: Muscle strength 5/5 to all lower extremity muscle groups bilaterally. Dorsal bunion RLE. Patient ambulates independent of any assistive aids.  Radiographs: None     ADA Risk Categorization: Low Risk :  Patient has all of the following: Intact protective sensation No prior foot ulcer  No severe deformity Pedal pulses present  Assessment: 1. Pain due to onychomycosis of toenail of right foot   2. Bunion, right foot   3. Diabetic peripheral neuropathy associated with type 2 diabetes mellitus (HCC)   4. Encounter for diabetic foot exam (HCC)      Plan: Diabetic foot examination performed today. All patient's and/or  POA's questions/concerns addressed on today's visit. Toenails 1-5 debrided in length and girth without incident. Continue foot and shoe inspections daily. Monitor blood glucose per PCP/Endocrinologist's recommendations. Continue soft, supportive shoe gear daily. Report any pedal injuries to medical professional. Call office if there are any questions/concerns. -Patient/POA to call should there be question/concern in the interim. Return in about 3 months (around 10/05/2023).  Freddie Breech, DPM      [place] LOCATION: [address] [place] [address]                   Office [phone]   [facility] LOCATION: [address] [place], [place] [address] Office [phone]

## 2023-07-20 ENCOUNTER — Ambulatory Visit
Admission: RE | Admit: 2023-07-20 | Discharge: 2023-07-20 | Disposition: A | Discharge status: Home / Self Care | Source: Ambulatory Visit | Attending: Adult Health | Admitting: Adult Health

## 2023-07-20 ENCOUNTER — Other Ambulatory Visit: Payer: Self-pay | Admitting: Adult Health

## 2023-07-20 ENCOUNTER — Ambulatory Visit
Admission: RE | Admit: 2023-07-20 | Discharge: 2023-07-20 | Disposition: A | Discharge status: Home / Self Care | Source: Ambulatory Visit | Attending: Adult Health

## 2023-07-20 DIAGNOSIS — N6452 Nipple discharge: Secondary | ICD-10-CM

## 2023-07-20 DIAGNOSIS — N644 Mastodynia: Secondary | ICD-10-CM

## 2023-07-20 DIAGNOSIS — N6310 Unspecified lump in the right breast, unspecified quadrant: Secondary | ICD-10-CM

## 2023-07-24 ENCOUNTER — Other Ambulatory Visit: Payer: Self-pay | Admitting: Interventional Cardiology

## 2023-07-27 ENCOUNTER — Ambulatory Visit
Admission: RE | Admit: 2023-07-27 | Discharge: 2023-07-27 | Disposition: A | Discharge status: Home / Self Care | Source: Ambulatory Visit | Attending: Adult Health | Admitting: Adult Health

## 2023-07-27 DIAGNOSIS — N6310 Unspecified lump in the right breast, unspecified quadrant: Secondary | ICD-10-CM

## 2023-07-27 HISTORY — PX: BREAST BIOPSY: SHX20

## 2023-07-28 ENCOUNTER — Other Ambulatory Visit (HOSPITAL_COMMUNITY): Payer: Self-pay

## 2023-07-28 LAB — SURGICAL PATHOLOGY

## 2023-07-28 MED ORDER — DILTIAZEM HCL ER COATED BEADS 180 MG PO CP24: 180.0000 mg | ORAL_CAPSULE | Freq: Every day | ORAL | 2 refills | Status: DC

## 2023-08-06 ENCOUNTER — Ambulatory Visit: Payer: Self-pay | Admitting: Surgery

## 2023-08-06 ENCOUNTER — Telehealth: Payer: Self-pay | Admitting: *Deleted

## 2023-08-06 DIAGNOSIS — D241 Benign neoplasm of right breast: Secondary | ICD-10-CM

## 2023-08-06 DIAGNOSIS — D242 Benign neoplasm of left breast: Secondary | ICD-10-CM

## 2023-08-06 NOTE — Telephone Encounter (Signed)
   Pre-operative Risk Assessment    Patient Name: Jaime Boone  DOB: March 21, 1941 MRN: [id number]   Date of last office visit: 06/25/23 DR. CHANDRASEKHAR Date of next office visit: NONE   Request for Surgical Clearance    Procedure:  LEFT BREAST SEED LUMPECTOMY  Date of Surgery:  Clearance TBD                                Surgeon:  DR. Sim Dryer Surgeon's Group or Practice Name:  [facility] Phone number:  [phone] Fax number:  [phone] MICHELLE BROOKS, CMA   Type of Clearance Requested:   - Medical  - Pharmacy:  Hold Apixaban  (Eliquis )     Type of Anesthesia:  General    Additional requests/questions:    [name]   08/06/2023, 12:20 PM

## 2023-08-09 NOTE — Telephone Encounter (Signed)
 Please advise holding Eliquis  prior to left breast seed lumpectomy.   Thank you!  DW

## 2023-08-09 NOTE — Telephone Encounter (Signed)
 Patient with diagnosis of afib on Eliquis  for anticoagulation.    Procedure: LEFT BREAST SEED LUMPECTOMY  Date of procedure: TBD   CHA2DS2-VASc Score = 5   This indicates a 7.2% annual risk of stroke. The patient's score is based upon: CHF History: 0 HTN History: 1 Diabetes History: 1 Stroke History: 0 Vascular Disease History: 0 Age Score: 2 Gender Score: 1  CrCl 44 mL/min Platelet count 234 K  Patient has not  had an Afib/aflutter ablation within the last 3 months or DCCV within the last 30 days   Per office protocol, patient can hold Eliquis  for 2 days prior to procedure.     **This guidance is not considered finalized until pre-operative APP has relayed final recommendations.**

## 2023-08-11 ENCOUNTER — Ambulatory Visit (HOSPITAL_COMMUNITY): Attending: Cardiology

## 2023-08-11 DIAGNOSIS — D6869 Other thrombophilia: Secondary | ICD-10-CM | POA: Insufficient documentation

## 2023-08-11 DIAGNOSIS — I10 Essential (primary) hypertension: Secondary | ICD-10-CM | POA: Insufficient documentation

## 2023-08-11 DIAGNOSIS — R011 Cardiac murmur, unspecified: Secondary | ICD-10-CM | POA: Insufficient documentation

## 2023-08-11 DIAGNOSIS — I480 Paroxysmal atrial fibrillation: Secondary | ICD-10-CM | POA: Diagnosis not present

## 2023-08-11 LAB — ECHOCARDIOGRAM COMPLETE
Area-P 1/2: 2.97 cm2
S' Lateral: 2.42 cm

## 2023-08-12 ENCOUNTER — Other Ambulatory Visit: Payer: Self-pay | Admitting: Surgery

## 2023-08-12 ENCOUNTER — Ambulatory Visit: Payer: Self-pay | Admitting: Internal Medicine

## 2023-08-12 DIAGNOSIS — G4730 Sleep apnea, unspecified: Secondary | ICD-10-CM

## 2023-08-12 DIAGNOSIS — E119 Type 2 diabetes mellitus without complications: Secondary | ICD-10-CM

## 2023-08-12 DIAGNOSIS — I480 Paroxysmal atrial fibrillation: Secondary | ICD-10-CM

## 2023-08-12 DIAGNOSIS — D242 Benign neoplasm of left breast: Secondary | ICD-10-CM

## 2023-08-18 NOTE — Telephone Encounter (Signed)
   Patient Name: Jaime Boone  DOB: 1941-01-12 MRN: [id number]  Primary Cardiologist: Jann Melody, MD  Chart reviewed as part of pre-operative protocol coverage. Given past medical history and time since last visit, based on ACC/AHA guidelines, IRAIS MOTTRAM is at acceptable risk for the planned procedure without further cardiovascular testing. She is being seen by cardiology PRN at her request.  Echo showed moderate to severe tricuspid regurgitation. She should follow up with sleep medicine.    Patient with diagnosis of afib on Eliquis  for anticoagulation.     Procedure: LEFT BREAST SEED LUMPECTOMY  Date of procedure: TBD     CHA2DS2-VASc Score = 5   This indicates a 7.2% annual risk of stroke. The patient's score is based upon: CHF History: 0 HTN History: 1 Diabetes History: 1 Stroke History: 0 Vascular Disease History: 0 Age Score: 2 Gender Score: 1   CrCl 44 mL/min Platelet count 234 K   Patient has not  had an Afib/aflutter ablation within the last 3 months or DCCV within the last 30 days     Per office protocol, patient can hold Eliquis  for 2 days prior to procedure.      The patient was advised that if she develops new symptoms prior to surgery to contact our office to arrange for a follow-up visit, and she verbalized understanding.  I will route this recommendation to the requesting party via Epic fax function and remove from pre-op pool.  Please call with questions.  Friddie Jetty, NP 08/18/2023, 10:40 AM

## 2023-09-20 NOTE — Telephone Encounter (Signed)
**Note De-Identified Sherian Valenza Obfuscation** Randy Hamp SAILOR, RN to [facility]    08/12/23  3:05 PM Result Note The patient has been notified of the result and verbalized understanding.  All questions (if any) were answered. Hamp SAILOR Randy, RN 08/12/2023 2:54 PM  Pt reports has not heard from outside office regarding OSA in over a year.  Pt is agreeable to complete itamar with our office.  Completed stopbang=4.  Advised pt one of our sleep coordinators will call with next steps. ECHOCARDIOGRAM COMPLETE  Hibbs, Damien BROCKS, RN to Me (Selected Message)     09/03/23 11:03 AM Please pre-cert Itamar. Pt has not picked it up yet. Please send a message to [facility] after it has been authorized and ask her to call the patient to set up a time for pickup. Thanks!

## 2023-09-20 NOTE — Telephone Encounter (Signed)
**Note De-Identified Aleigha Gilani Obfuscation** Ordering provider: Dr Santo Associated diagnoses: [phone]  WatchPAT PA obtained on 09/20/2023 by Natali Lavallee, Avelina HERO, LPN. Authorization: Per the [facility] provider portal: Procedure code 04199 (Itamar-HST) Description Sleep study, unattended, simultaneous recording; heart rate, oxygen saturation, respiratory analysis (eg, by airflow or peripheral arterial tone), and sleep time Inquiry summary Notification/Prior Authorization not required for this service.  Patient NOT notified of PIN (1234) on 09/20/2023 as he does not have a WatchPAT One device. Will provide Pin # when he comes to the office to pick up the device and to get instructions on how to use.  Phone note routed to covering staff for follow-up.

## 2023-10-07 NOTE — Progress Notes (Signed)
 Reviewed pt hx and cardiac notes with Dr. Mallory at [facility]. Patient will need to be moved to main due to echo results from 07/2023. (Results:  Moderate to severe tricuspid regurgitation (worst in short axis)- this likely explains her murmur  IVC thin and collapsible- suggestive of patient being euvolemic)  LVM with Tonya at [facility]

## 2023-10-12 ENCOUNTER — Encounter

## 2023-10-14 ENCOUNTER — Encounter

## 2023-10-15 NOTE — Telephone Encounter (Signed)
 Rockie will call the patient and issue his device.

## 2023-10-19 ENCOUNTER — Encounter: Payer: Self-pay | Admitting: Podiatry

## 2023-10-19 ENCOUNTER — Ambulatory Visit (INDEPENDENT_AMBULATORY_CARE_PROVIDER_SITE_OTHER): Admitting: Podiatry

## 2023-10-19 DIAGNOSIS — E1142 Type 2 diabetes mellitus with diabetic polyneuropathy: Secondary | ICD-10-CM

## 2023-10-19 DIAGNOSIS — M79674 Pain in right toe(s): Secondary | ICD-10-CM | POA: Diagnosis not present

## 2023-10-19 DIAGNOSIS — B351 Tinea unguium: Secondary | ICD-10-CM

## 2023-10-20 ENCOUNTER — Telehealth: Payer: Self-pay | Admitting: Radiology

## 2023-10-20 ENCOUNTER — Ambulatory Visit: Attending: Cardiovascular Disease

## 2023-10-20 ENCOUNTER — Encounter (INDEPENDENT_AMBULATORY_CARE_PROVIDER_SITE_OTHER): Payer: Self-pay | Admitting: Cardiology

## 2023-10-20 DIAGNOSIS — G4733 Obstructive sleep apnea (adult) (pediatric): Secondary | ICD-10-CM | POA: Diagnosis not present

## 2023-10-20 NOTE — Telephone Encounter (Signed)
 Patient agreement reviewed and signed on 10/20/2023.  WatchPAT issued to patient on 10/20/2023 by Woodie LOISE Prudent. Patient was given PIN number Patient profile initialized in CloudPAT on 10/20/2023 by Rockie RAMAN. Device serial number: [id number]  Please list Reason for Call as Advice Only and type WatchPAT issued to patient in the comment box.

## 2023-10-21 NOTE — Progress Notes (Addendum)
 Surgical Instructions   Your procedure is scheduled on Wednesday July 30. Report to [facility] Main Entrance A at 5:30 A.M., then check in with the Admitting office. Any questions or running late day of surgery: call [phone]  Questions prior to your surgery date: call [phone], Monday-Friday, 8am-4pm. If you experience any cold or flu symptoms such as cough, fever, chills, shortness of breath, etc. between now and your scheduled surgery, please notify us  at the above number.     Remember:  Do not eat after midnight the night before your surgery  You may drink clear liquids until 4:30am the morning of your surgery.   Clear liquids allowed are: Water, Non-Citrus Juices (without pulp), Carbonated Beverages, Clear Tea (no milk, honey, etc.), Black Coffee Only (NO MILK, CREAM OR POWDERED CREAMER of any kind), and Gatorade.    Take these medicines the morning of surgery with A SIP OF WATER  cetirizine (ZYRTEC)  diltiazem  (CARDIZEM  CD)  fluticasone furoate-vilanterol (BREO ELLIPTA)  levothyroxine (SYNTHROID)  montelukast (SINGULAIR)  rosuvastatin  (CRESTOR )   May take these medicines IF NEEDED: PROAIR HFA 108 (90 BASE) MCG/ACT inhaler -please bring inhaler to the hospital   One week prior to surgery, STOP taking any Aspirin (unless otherwise instructed by your surgeon) Aleve, Naproxen, Ibuprofen, Motrin, Advil, Goody's, BC's, all herbal medications, fish oil, and non-prescription vitamins.  Per your cardiologist's instructions, hold Eliquis  2 days prior to surgery. Take your last dose Sunday 7/27.            WHAT DO I DO ABOUT MY DIABETES MEDICATION?  HOLD JARDIANCE 72 HOURS PRIOR TO SURGERY. TAKE YOUR LAST DOSE SATURDAY 7/26.  THE NIGHT BEFORE SURGERY, take 5 units of LANTUS  insulin .This is 1/2 your usual dose of 10 units.        THE MORNING OF SURGERY, take 10 units of LANTUS  insulin .This is 1/2 your usual dose of 20 units.    HOW TO MANAGE YOUR DIABETES BEFORE AND  AFTER SURGERY  Why is it important to control my blood sugar before and after surgery? Improving blood sugar levels before and after surgery helps healing and can limit problems. A way of improving blood sugar control is eating a healthy diet by:  Eating less sugar and carbohydrates  Increasing activity/exercise  Talking with your doctor about reaching your blood sugar goals High blood sugars (greater than 180 mg/dL) can raise your risk of infections and slow your recovery, so you will need to focus on controlling your diabetes during the weeks before surgery. Make sure that the doctor who takes care of your diabetes knows about your planned surgery including the date and location.  How do I manage my blood sugar before surgery? Check your blood sugar at least 4 times a day, starting 2 days before surgery, to make sure that the level is not too high or low.  Check your blood sugar the morning of your surgery when you wake up and every 2 hours until you get to the Short Stay unit.  If your blood sugar is less than 70 mg/dL, you will need to treat for low blood sugar: Do not take insulin . Treat a low blood sugar (less than 70 mg/dL) with  cup of clear juice (cranberry or apple), 4 glucose tablets, OR glucose gel. Recheck blood sugar in 15 minutes after treatment (to make sure it is greater than 70 mg/dL). If your blood sugar is not greater than 70 mg/dL on recheck, call [phone] for further instructions. Report your blood  sugar to the short stay nurse when you get to Short Stay.  If you are admitted to the hospital after surgery: Your blood sugar will be checked by the staff and you will probably be given insulin  after surgery (instead of oral diabetes medicines) to make sure you have good blood sugar levels. The goal for blood sugar control after surgery is 80-180 mg/dL.           Do NOT Smoke (Tobacco/Vaping) for 24 hours prior to your procedure.  If you use a CPAP at night, you may  bring your mask/headgear for your overnight stay.   You will be asked to remove any contacts, glasses, piercing's, hearing aid's, dentures/partials prior to surgery. Please bring cases for these items if needed.    Patients discharged the day of surgery will not be allowed to drive home, and someone needs to stay with them for 24 hours.  SURGICAL WAITING ROOM VISITATION Patients may have no more than 2 support people in the waiting area - these visitors may rotate.   Pre-op nurse will coordinate an appropriate time for 1 ADULT support person, who may not rotate, to accompany patient in pre-op.  Children under the age of 66 must have an adult with them who is not the patient and must remain in the main waiting area with an adult.  If the patient needs to stay at the hospital during part of their recovery, the visitor guidelines for inpatient rooms apply.  Please refer to the [facility] website for the visitor guidelines for any additional information.   If you received a COVID test during your pre-op visit  it is requested that you wear a mask when out in public, stay away from anyone that may not be feeling well and notify your surgeon if you develop symptoms. If you have been in contact with anyone that has tested positive in the last 10 days please notify you surgeon.      Pre-operative CHG Bathing Instructions   You can play a key role in reducing the risk of infection after surgery. Your skin needs to be as free of germs as possible. You can reduce the number of germs on your skin by washing with CHG (chlorhexidine gluconate) soap before surgery. CHG is an antiseptic soap that kills germs and continues to kill germs even after washing.   DO NOT use if you have an allergy to chlorhexidine/CHG or antibacterial soaps. If your skin becomes reddened or irritated, stop using the CHG and notify one of our RNs at [phone].              TAKE A SHOWER THE NIGHT BEFORE SURGERY AND THE DAY OF  SURGERY    Please keep in mind the following:  DO NOT shave, including legs and underarms, 48 hours prior to surgery.   You may shave your face before/day of surgery.  Place clean sheets on your bed the night before surgery Use a clean washcloth (not used since being washed) for each shower. DO NOT sleep with pet's night before surgery.  CHG Shower Instructions:  Wash your face and private area with normal soap. If you choose to wash your hair, wash first with your normal shampoo.  After you use shampoo/soap, rinse your hair and body thoroughly to remove shampoo/soap residue.  Turn the water OFF and apply half the bottle of CHG soap to a CLEAN washcloth.  Apply CHG soap ONLY FROM YOUR NECK DOWN TO YOUR TOES (washing for  3-5 minutes)  DO NOT use CHG soap on face, private areas, open wounds, or sores.  Pay special attention to the area where your surgery is being performed.  If you are having back surgery, having someone wash your back for you may be helpful. Wait 2 minutes after CHG soap is applied, then you may rinse off the CHG soap.  Pat dry with a clean towel  Put on clean pajamas    Additional instructions for the day of surgery: DO NOT APPLY any lotions, deodorants, cologne, or perfumes.   Do not wear jewelry or makeup Do not wear nail polish, gel polish, artificial nails, or any other type of covering on natural nails (fingers and toes) Do not bring valuables to the hospital. [facility] is not responsible for valuables/personal belongings. Put on clean/comfortable clothes.  Please brush your teeth.  Ask your nurse before applying any prescription medications to the skin.

## 2023-10-22 ENCOUNTER — Encounter (HOSPITAL_COMMUNITY)
Admission: RE | Admit: 2023-10-22 | Discharge: 2023-10-22 | Disposition: A | Discharge status: Home / Self Care | Source: Ambulatory Visit | Attending: Surgery | Admitting: Surgery

## 2023-10-22 ENCOUNTER — Ambulatory Visit: Attending: Internal Medicine

## 2023-10-22 ENCOUNTER — Encounter (HOSPITAL_COMMUNITY): Payer: Self-pay

## 2023-10-22 ENCOUNTER — Other Ambulatory Visit: Payer: Self-pay

## 2023-10-22 VITALS — BP 139/81 | HR 67 | Temp 98.6°F | Resp 17 | Ht 64.0 in | Wt 189.6 lb

## 2023-10-22 DIAGNOSIS — I10 Essential (primary) hypertension: Secondary | ICD-10-CM | POA: Diagnosis not present

## 2023-10-22 DIAGNOSIS — I4891 Unspecified atrial fibrillation: Secondary | ICD-10-CM | POA: Insufficient documentation

## 2023-10-22 DIAGNOSIS — Z794 Long term (current) use of insulin: Secondary | ICD-10-CM | POA: Diagnosis not present

## 2023-10-22 DIAGNOSIS — K219 Gastro-esophageal reflux disease without esophagitis: Secondary | ICD-10-CM | POA: Insufficient documentation

## 2023-10-22 DIAGNOSIS — Z01812 Encounter for preprocedural laboratory examination: Secondary | ICD-10-CM | POA: Diagnosis not present

## 2023-10-22 DIAGNOSIS — G4733 Obstructive sleep apnea (adult) (pediatric): Secondary | ICD-10-CM | POA: Diagnosis not present

## 2023-10-22 DIAGNOSIS — Z7984 Long term (current) use of oral hypoglycemic drugs: Secondary | ICD-10-CM | POA: Insufficient documentation

## 2023-10-22 DIAGNOSIS — E119 Type 2 diabetes mellitus without complications: Secondary | ICD-10-CM | POA: Insufficient documentation

## 2023-10-22 DIAGNOSIS — K760 Fatty (change of) liver, not elsewhere classified: Secondary | ICD-10-CM | POA: Insufficient documentation

## 2023-10-22 DIAGNOSIS — J45909 Unspecified asthma, uncomplicated: Secondary | ICD-10-CM | POA: Insufficient documentation

## 2023-10-22 DIAGNOSIS — D241 Benign neoplasm of right breast: Secondary | ICD-10-CM | POA: Diagnosis not present

## 2023-10-22 DIAGNOSIS — I361 Nonrheumatic tricuspid (valve) insufficiency: Secondary | ICD-10-CM | POA: Diagnosis not present

## 2023-10-22 DIAGNOSIS — G4730 Sleep apnea, unspecified: Secondary | ICD-10-CM

## 2023-10-22 DIAGNOSIS — Z01818 Encounter for other preprocedural examination: Secondary | ICD-10-CM | POA: Diagnosis present

## 2023-10-22 DIAGNOSIS — I480 Paroxysmal atrial fibrillation: Secondary | ICD-10-CM

## 2023-10-22 HISTORY — DX: Other complications of anesthesia, initial encounter: T88.59XA

## 2023-10-22 HISTORY — DX: Depression, unspecified: F32.A

## 2023-10-22 LAB — COMPREHENSIVE METABOLIC PANEL WITH GFR
ALT: 23 U/L (ref 0–44)
AST: 29 U/L (ref 15–41)
Albumin: 3.7 g/dL (ref 3.5–5.0)
Alkaline Phosphatase: 70 U/L (ref 38–126)
Anion gap: 8 (ref 5–15)
BUN: 8 mg/dL (ref 8–23)
CO2: 24 mmol/L (ref 22–32)
Calcium: 9.2 mg/dL (ref 8.9–10.3)
Chloride: 105 mmol/L (ref 98–111)
Creatinine, Ser: 1.04 mg/dL — ABNORMAL HIGH (ref 0.44–1.00)
GFR, Estimated: 54 mL/min — ABNORMAL LOW
Glucose, Bld: 116 mg/dL — ABNORMAL HIGH (ref 70–99)
Potassium: 4 mmol/L (ref 3.5–5.1)
Sodium: 137 mmol/L (ref 135–145)
Total Bilirubin: 1 mg/dL (ref 0.0–1.2)
Total Protein: 7.3 g/dL (ref 6.5–8.1)

## 2023-10-22 LAB — HEMOGLOBIN A1C
Hgb A1c MFr Bld: 7.8 % — ABNORMAL HIGH (ref 4.8–5.6)
Mean Plasma Glucose: 177.16 mg/dL

## 2023-10-22 LAB — CBC
HCT: 40 % (ref 36.0–46.0)
Hemoglobin: 12.1 g/dL (ref 12.0–15.0)
MCH: 20.5 pg — ABNORMAL LOW (ref 26.0–34.0)
MCHC: 30.3 g/dL (ref 30.0–36.0)
MCV: 67.9 fL — ABNORMAL LOW (ref 80.0–100.0)
Platelets: 293 K/uL (ref 150–400)
RBC: 5.89 MIL/uL — ABNORMAL HIGH (ref 3.87–5.11)
RDW: 18.3 % — ABNORMAL HIGH (ref 11.5–15.5)
WBC: 4.9 K/uL (ref 4.0–10.5)
nRBC: 0 % (ref 0.0–0.2)

## 2023-10-22 LAB — GLUCOSE, CAPILLARY: Glucose-Capillary: 140 mg/dL — ABNORMAL HIGH (ref 70–99)

## 2023-10-22 NOTE — Procedures (Signed)
     SLEEP STUDY REPORT Patient Information Study Date: 10/20/2023 Patient Name: Jaime Boone Patient ID: [id number] Birth Date: 1940-07-14 Age: 83 Gender: Female BMI: 34.0 (W=192 lb, H=5' 3'') Stopbang: 4 Referring Physician: Santo Kelly, MD  TEST DESCRIPTION: Home sleep apnea testing was completed using the WatchPat, a Type 1 device, utilizing  peripheral arterial tonometry (PAT), chest movement, actigraphy, pulse oximetry, pulse rate, body position and snore.  AHI was calculated with apnea and hypopnea using valid sleep time as the denominator. RDI includes apneas,  hypopneas, and RERAs. The data acquired and the scoring of sleep and all associated events were performed in  accordance with the recommended standards and specifications as outlined in the AASM Manual for the Scoring of  Sleep and Associated Events 2.2.0 (2015).  FINDINGS:  1. Severe Obstructive Sleep Apnea with AHI 32.1/hr.   2. No Central Sleep Apnea with pAHIc 4.3/hr.  3. Oxygen desaturations as low as 79%.  4. Moderate to severe snoring was present. O2 sats were < 88% for 80.6 min.  5. Total sleep time was 7 hrs and 20 min.  6. 14.6% of total sleep time was spent in REM sleep.   7. sleep onset latency at 19 min.   8. REM sleep onset latency at 112 min.   9. Total awakenings were 13.  10. Arrhythmia detection: None  DIAGNOSIS:  Severe Obstructive Sleep Apnea (G47.33) Nocturnal Hypoxemia  RECOMMENDATIONS: 1. Clinical correlation of these findings is necessary. The decision to treat obstructive sleep apnea (OSA) is usually  based on the presence of apnea symptoms or the presence of associated medical conditions such as Hypertension,  Congestive Heart Failure, Atrial Fibrillation or Obesity. The most common symptoms of OSA are snoring, gasping for  breath while sleeping, daytime sleepiness and fatigue.  2. Initiating apnea therapy is recommended given the presence of symptoms and/or associated  conditions.  Recommend proceeding with one of the following:  a. Auto-CPAP therapy with a pressure range of 5-20cm H2O.  b. An oral appliance (OA) that can be obtained from certain dentists with expertise in sleep medicine. These are  primarily of use in non-obese patients with mild and moderate disease.  c. An ENT consultation which may be useful to look for specific causes of obstruction and possible treatment  options.  d. If patient is intolerant to PAP therapy, consider referral to ENT for evaluation for hypoglossal nerve stimulator.  3. Close follow-up is necessary to ensure success with CPAP or oral appliance therapy for maximum benefit . 4. A follow-up oximetry study on CPAP is recommended to assess the adequacy of therapy and determine the need  for supplemental oxygen or the potential need for Bi-level therapy. An arterial blood gas to determine the adequacy of  baseline ventilation and oxygenation should also be considered. 5. Healthy sleep recommendations include: adequate nightly sleep (normal 7-9 hrs/night), avoidance of caffeine after  noon and alcohol near bedtime, and maintaining a sleep environment that is cool, dark and quiet. 6. Weight loss for overweight patients is recommended. Even modest amounts of weight loss can significantly  improve the severity of sleep apnea. 7. Snoring recommendations include: weight loss where appropriate, side sleeping, and avoidance of alcohol before  bed. 8. Operation of motor vehicle should be avoided when sleepy.  Signature: Wilbert Bihari, MD; [facility]; Diplomat, American Board of Sleep  Medicine Electronically Signed: 10/22/2023 4:19:02 PM

## 2023-10-22 NOTE — Progress Notes (Signed)
 PCP - Dr. Searcy Overcast Cardiologist - Dr. Stanly Leavens  PPM/ICD - denies Device Orders - na Rep Notified - na  Chest x-ray - na EKG - 05/31/2023 Stress Test -  ECHO - 08/11/2023 Cardiac Cath -   Sleep Study - OSA, just diagnosed CPAP - not yest  Type II diabetic. Blood sugar 140 at PAT appointment.  A1C drawn during PAT appointment Fasting Blood Sugar : doesn't check Checks Blood Sugar: doesn't check  Last dose of GLP1 agonist-  denies GLP1 instructions: na  Blood Thinner Instructions: Eliquis , hold 2 days Aspirin Instructions:denies  ERAS Protcol -Clears until 0430  Anesthesia review: Yes. HTN, DM, OSA, A-fib, on Eliquis   Patient denies shortness of breath, fever, cough and chest pain at PAT appointment   All instructions explained to the patient, with a verbal understanding of the material. Patient agrees to go over the instructions while at home for a better understanding. Patient also instructed to self quarantine after being tested for COVID-19. The opportunity to ask questions was provided.

## 2023-10-24 NOTE — Progress Notes (Signed)
  Subjective:  Patient ID: Jaime Boone, female    DOB: 23-Mar-1941,  MRN: [id number]  Jaime Boone presents to clinic today for at risk foot care with history of diabetic neuropathy and painful thick toenails that are difficult to trim. Pain interferes with ambulation. Aggravating factors include wearing enclosed shoe gear. Pain is relieved with periodic professional debridement.  Chief Complaint  Patient presents with   Diabetes    DFC IDDM A1C 6.5 toenail trim. LOV with PCP 06/28/23.   New problem(s): None.   PCP is Avva, Ravisankar, MD.  Allergies  Allergen Reactions   Doxycycline Shortness Of Breath   Penicillins Anaphylaxis and Swelling    Throat swells    Prednisone Shortness Of Breath   Aspirin Other (See Comments)    Stomach pain   Ciprofloxacin  Nausea And Vomiting   Codeine Nausea And Vomiting   Duloxetine Hcl Nausea And Vomiting   Fentanyl  Other (See Comments)    lightheaded   Nitrofuran Derivatives Nausea And Vomiting   Versed [Midazolam] Other (See Comments)    lightheaded    Review of Systems: Negative except as noted in the HPI.  Objective: No changes noted in today's physical examination. There were no vitals filed for this visit. Jaime Boone is a pleasant 83 y.o. female in NAD. AAO x 3.  Vascular Examination: Capillary refill time immediate b/l. Palpable pedal pulses. Pedal hair absent b/l. Pedal edema absent. No pain with calf compression b/l. Skin temperature gradient WNL b/l. No cyanosis or clubbing b/l. No ischemia or gangrene noted b/l.   Neurological Examination: Sensation grossly intact b/l with 10 gram monofilament. Vibratory sensation intact b/l. Pt has subjective symptoms of neuropathy.  Dermatological Examination: Pedal skin with normal turgor, texture and tone b/l.  No open wounds. No interdigital macerations.   Toenails 1-5 b/l thick, discolored, elongated with subungual debris and pain on dorsal palpation.   No corns, calluses, nor  porokeratotic lesions.  Musculoskeletal Examination: Muscle strength 5/5 to all lower extremity muscle groups bilaterally. Dorsal bunion right foot.  Radiographs: None  Last A1c:      Latest Ref Rng & Units 10/22/2023    9:00 AM  Hemoglobin A1C  Hemoglobin-A1c 4.8 - 5.6 % 7.8    Assessment/Plan: 1. Pain due to onychomycosis of toenail of right foot   2. Diabetic peripheral neuropathy associated with type 2 diabetes mellitus (HCC)     Patient was evaluated and treated. All patient's and/or POA's questions/concerns addressed on today's visit. Mycotic toenails 1-5 debrided in length and girth without incident.  Continue daily foot inspections and monitor blood glucose per PCP/Endocrinologist's recommendations.Continue soft, supportive shoe gear daily. Report any pedal injuries to medical professional. Call office if there are any quesitons/concerns. -Patient/POA to call should there be question/concern in the interim.   Return in about 3 months (around 01/19/2024).  Jaime Boone, DPM      [place] LOCATION: [address] [place] [address]                   Office [phone]   [facility] LOCATION: [address] [place], [place] [address] Office [phone]

## 2023-10-25 NOTE — Anesthesia Preprocedure Evaluation (Signed)
 Anesthesia Evaluation  Patient identified by MRN, date of birth, ID band Patient awake    Reviewed: Allergy & Precautions, NPO status , Patient's Chart, lab work & pertinent test results, reviewed documented beta blocker date and time   History of Anesthesia Complications (+) history of anesthetic complications  Airway Mallampati: II       Dental no notable dental hx.    Pulmonary asthma , sleep apnea , former smoker Recently diagnosed OSA, no CPAP yet AHI 32, desaturation to 79%   breath sounds clear to auscultation + decreased breath sounds      Cardiovascular hypertension, Pt. on medications Normal cardiovascular exam+ dysrhythmias Atrial Fibrillation + Valvular Problems/Murmurs MR  Rhythm:Regular  Echo 08/11/23: IMPRESSIONS   1. Left ventricular ejection fraction, by estimation, is 55 to 60%. Left  ventricular ejection fraction by 3D volume is 59 %. The left ventricle has  normal function. The left ventricle has no regional wall motion  abnormalities. Left ventricular diastolic   parameters are consistent with Grade I diastolic dysfunction (impaired  relaxation). The average left ventricular global longitudinal strain is  -23.3 %. The global longitudinal strain is normal.   2. Right ventricular systolic function is normal. The right ventricular  size is normal. There is normal pulmonary artery systolic pressure.   3. The mitral valve is normal in structure. Mild mitral valve  regurgitation. No evidence of mitral stenosis.   4. Tricuspid valve regurgitation is moderate to severe.   5. The aortic valve is normal in structure. Aortic valve regurgitation is  not visualized. No aortic stenosis is present.   6. The inferior vena cava is normal in size with greater than 50%  respiratory variability, suggesting right atrial pressure of 3 mmHg.      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  Renal/GU Renal InsufficiencyRenal disease  negative genitourinary   Musculoskeletal  (+) Arthritis , Rheumatoid disorders,  ?RA   Abdominal  (+) + obese  Peds  Hematology  (+) Blood dyscrasia, anemia Eliquis  therapy- last dose   Anesthesia Other Findings   Reproductive/Obstetrics                              Anesthesia Physical Anesthesia Plan  ASA: 3  Anesthesia Plan: General   Post-op Pain Management: Minimal or no pain anticipated and Dilaudid  IV   Induction: Intravenous  PONV Risk Score and Plan: 4 or greater and Treatment may vary due to age or medical condition, Ondansetron  and Dexamethasone   Airway Management Planned: LMA  Additional Equipment: None  Intra-op Plan:   Post-operative Plan: Extubation in OR  Informed Consent: I have reviewed the patients History and Physical, chart, labs and discussed the procedure including the risks, benefits and alternatives for the proposed anesthesia with the patient or authorized representative who has indicated his/her understanding and acceptance.     Dental advisory given  Plan Discussed with: Anesthesiologist and CRNA  Anesthesia Plan Comments: (PAT note written 10/25/2023 by Allison Zelenak, PA-C.  )         Anesthesia Quick Evaluation

## 2023-10-25 NOTE — Progress Notes (Signed)
 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  Surgery was initially scheduled for them [facility] but moved to the main OR given moderate to severe TR on recent echo.  History includes former smoker (quit 1975), HTN, DM2, asthma, GERD, hepatitis C (s/p treatment), fatty liver disease, OSA (recent diagnosis, no CPAP yet), atrial fibrillation (diagnosed 12/2020), chest pain (history of), connective tissue disease (RA?).   Preoperative cardiology input outlined on 08/18/23 by Lawrence, Katahryn, NP. Given past medical history and time since last visit, based on ACC/AHA guidelines, Jaime Boone is at acceptable risk for the planned procedure without further cardiovascular testing. She is being seen by cardiology PRN at her request.  Echo showed moderate to severe tricuspid regurgitation. She should follow up with sleep medicine. May 2025 TTE showed LVEF 55-60%, no regional wall motion abnormalities, grade 1 diastolic dysfunction, normal RV systolic function, normal PASP, mild MR, moderate-severe TR.  Dr. Santo did recommend repeat echo in one year.  She has known A-fib diagnosed in October 2022. Per cardiology protocol, she was instructed to hold Eliquis  for 2 days prior to surgery.   RSL scheduled for 729 at 10 AM.  Anesthesia team to evaluate on the day of surgery.   VS: BP 139/81   Pulse 67   Temp 37 C   Resp 17   Ht 5' 4 (1.626 m)   Wt 86 kg   SpO2 100%   BMI 32.54 kg/m    PROVIDERS: Janey Santos, MD  is PCP  Santo Kelly, MD is the allergist.  She was previously seeing Ballard Beverage, MD. She had been followed at [facility] by Hays Morrison, NP. Last visit 12/10/20, Given her advanced age, stable LFTs within normal limits, and that we have treated and cured her hepatitis C I think it is unlikely that she will have significant fibrosis progression during her lifetime. As needed follow-up.   LABS: Preoperative labs noted.  A1c 7.8%. (all labs ordered are listed, but only abnormal results are displayed)  Labs Reviewed  GLUCOSE, CAPILLARY - Abnormal; Notable for the following components:      Result Value   Glucose-Capillary 140 (*)    All other components within normal limits  HEMOGLOBIN A1C - Abnormal; Notable for the following components:   Hgb A1c MFr Bld 7.8 (*)    All other components within normal limits  COMPREHENSIVE METABOLIC PANEL WITH GFR - Abnormal; Notable for the following components:   Glucose, Bld 116 (*)    Creatinine, Ser 1.04 (*)    GFR, Estimated 54 (*)    All other components within normal limits  CBC - Abnormal; Notable for the following components:   RBC 5.89 (*)    MCV 67.9 (*)    MCH 20.5 (*)    RDW 18.3 (*)    All other components within normal limits    Sleep Study 10/20/2023: FINDINGS:  1. Severe Obstructive Sleep Apnea with AHI 32.1/hr.   2. No Central Sleep Apnea with pAHIc 4.3/hr.  3. Oxygen desaturations as low as 79%.  4. Moderate to severe snoring was present. O2 sats were < 88% for 80.6 min.  5. Total sleep time was 7 hrs and 20 min.  6. 14.6% of total sleep time was spent in REM sleep.   7. sleep onset latency at 19 min.   8. REM sleep onset latency at 112 min.   9. Total awakenings were 13.  10. Arrhythmia detection: None    EKG: 05/31/23: Normal sinus rhythm Nonspecific T wave abnormality Abnormal ECG When compared with ECG of 10-Apr-2022 08:36, No significant change since last tracing Confirmed by Waddell Lusher  [phone]) on 05/31/2023 9:36:14 PM  CV: Echo 08/11/23: IMPRESSIONS   1. Left ventricular ejection fraction, by estimation, is 55 to 60%. Left  ventricular ejection fraction by 3D volume is 59 %. The left ventricle has  normal function. The left ventricle has no regional wall motion  abnormalities. Left ventricular diastolic   parameters are consistent with Grade I diastolic dysfunction (impaired  relaxation). The average left ventricular global longitudinal strain is  -23.3 %. The global longitudinal strain is normal.   2. Right ventricular systolic function is normal. The right ventricular  size is normal. There is normal pulmonary artery systolic pressure.   3. The mitral valve is normal in structure. Mild mitral valve  regurgitation. No evidence of mitral stenosis.   4. Tricuspid valve regurgitation is moderate to severe.   5. The aortic valve is normal in structure. Aortic valve regurgitation is  not visualized. No aortic stenosis is present.   6. The inferior vena cava is normal in size with greater than 50%  respiratory variability, suggesting right atrial pressure of 3 mmHg.    Past Medical History:  Diagnosis Date   Acid reflux    Acute bilateral knee pain    Arthritis    Asthma    Asthma    Atrial fibrillation (HCC)    Chest pain    Cirrhosis (HCC)    Complication of anesthesia    gets dizzy   Connective tissue disease (HCC)    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   Degenerative joint disease    Depression    Diabetes mellitus without complication (HCC)    DJD (degenerative joint disease)    DM (diabetes mellitus) (HCC)    Fatigue    Fatty liver    GERD (gastroesophageal reflux disease)    Hepatitis C    History of hepatitis C    Hypertension    Jaw pain    OSA (obstructive sleep apnea)    Sleep apnea    Tiredness     Past Surgical History:  Procedure Laterality Date   ABDOMINAL HYSTERECTOMY     TAH   APPENDECTOMY     BREAST BIOPSY Right 07/27/2023   US  RT  BREAST BX W LOC DEV 1ST LESION IMG BX SPEC US  GUIDE 07/27/2023 [facility] MAMMOGRAPHY   DILATION AND CURETTAGE OF UTERUS     EYE SURGERY Left    CATARACT   FOOT SURGERY Bilateral    BUNIONECTOMY   GALLBLADDER SURGERY     keiloid removal      ear   NOSE SURGERY     oral stone extraction      MEDICATIONS:  apixaban  (ELIQUIS ) 5 MG TABS tablet   ascorbic acid (VITAMIN C) 500 MG tablet   B-D ULTRAFINE III SHORT PEN 31G X 8 MM MISC   cetirizine (ZYRTEC) 10 MG tablet   Cholecalciferol 50 MCG (2000  UT) CAPS   Continuous Blood Gluc Receiver (DEXCOM G7 RECEIVER) DEVI   Continuous Blood Gluc Sensor (DEXCOM G7 SENSOR) MISC   diltiazem  (CARDIZEM  CD) 180 MG 24 hr capsule   escitalopram (LEXAPRO) 5 MG tablet   ferrous sulfate 325 (65 FE) MG EC tablet   fluticasone furoate-vilanterol (BREO ELLIPTA) 100-25 MCG/INH AEPB   JARDIANCE 25 MG TABS tablet   Lancets (ONETOUCH DELICA PLUS LANCET33G) MISC   LANTUS  SOLOSTAR 100 UNIT/ML Solostar Pen   levothyroxine (SYNTHROID) 75 MCG tablet   lidocaine  (LIDODERM ) 5 %   Magnesium  Glycinate 100 MG CAPS   montelukast (SINGULAIR) 10 MG tablet   Multiple Vitamin (MULTIVITAMIN WITH MINERALS) TABS tablet   Multiple Vitamins-Minerals (VITAFUSION MULTI WOMENS PO)   ONE TOUCH ULTRA TEST test strip   pantoprazole  (PROTONIX ) 40 MG tablet   PROAIR HFA 108 (90 BASE) MCG/ACT inhaler   rosuvastatin  (CRESTOR ) 20 MG tablet   triamcinolone cream (KENALOG) 0.1 %   No current facility-administered medications for this encounter.   Advised to hold Jardiance 72 hours prior to surgery.   Isaiah Ruder, PA-C Surgical Short Stay/Anesthesiology [facility] Phone [phone] [facility] Phone [phone] 10/25/2023 4:04 PM

## 2023-10-25 NOTE — H&P (Signed)
 History of Present Illness: Jaime Boone is a 83 y.o. female who is seen today as an office consultation for evaluation of New Consultation (R breast papilloma)  Patient presents evaluation for abnormal mammogram. She had a density right breast localized by core biopsy that showed intraductal papilloma with atypia. Patient relates history of right nipple discharge which is dark in nature since March. No other masses noted. She had a niece with breast cancer. No other complaints today.  Review of Systems: A complete review of systems was obtained from the patient. I have reviewed this information and discussed as appropriate with the patient. See HPI as well for other ROS.    Medical History: Past Medical History:  Diagnosis Date  Anemia  Arthritis  Asthma, unspecified asthma severity, unspecified whether complicated, unspecified whether persistent ([facility]-HCC)  GERD (gastroesophageal reflux disease)  Sleep apnea   There is no problem list on file for this patient.  Past Surgical History:  Procedure Laterality Date  APPENDECTOMY  CATARACT EXTRACTION  CHOLECYSTECTOMY    Allergies  Allergen Reactions  Doxycycline Shortness Of Breath  Aspirin Other (See Comments)  Stomach pain  Ciprofloxacin  Nausea And Vomiting and Other (See Comments)  Codeine Nausea And Vomiting and Other (See Comments)  Duloxetine Hcl Nausea And Vomiting  Nitrofurantoin  Vomiting  Penicillins Other (See Comments) and Swelling  THROAT Has patient had a PCN reaction causing immediate rash, facial/tongue/throat swelling, SOB or lightheadedness with hypotension: yes Has patient had a PCN reaction causing severe rash involving mucus membranes or skin necrosis: no Has patient had a PCN reaction that required hospitalization: no Has patient had a PCN reaction occurring within the last 10 years: no If all of the above answers are NO, then may proceed with Cephalosporin use.   Current Outpatient Medications on File  Prior to Visit  Medication Sig Dispense Refill  apixaban  (ELIQUIS ) 5 mg tablet Take 5 mg by mouth 2 (two) times daily  BREO ELLIPTA 100-25 mcg/dose DsDv inhaler 1 Puff  cetirizine (ZYRTEC) 10 MG tablet Take 10 mg by mouth  cyclobenzaprine  (FLEXERIL ) 5 MG tablet Take 5 mg by mouth 3 (three) times daily as needed  diclofenac  (VOLTAREN ) 1 % topical gel Apply topically as needed  dilTIAZem  (CARDIZEM  CD) 180 MG CD capsule Take 180 mg by mouth once daily  escitalopram oxalate (LEXAPRO) 5 MG tablet Take 5 mg by mouth at bedtime  JARDIANCE 10 mg tablet Take 10 mg by mouth once daily  LANTUS  SOLOSTAR U-100 INSULIN  pen injector (concentration 100 units/mL) INJECT 20 UNITS in AM, 10 U in PM Subcutaneous for 187 days  levothyroxine (SYNTHROID) 75 MCG tablet Take 75 mcg by mouth every morning  montelukast (SINGULAIR) 10 mg tablet Take 10 mg by mouth  pantoprazole  (PROTONIX ) 40 MG DR tablet Take 40 mg by mouth 2 (two) times daily  psyllium, with dextrose , (METAMUCIL) powder Taking 1 tsp by mouth twice daily  rosuvastatin  (CRESTOR ) 20 MG tablet Take 20 mg by mouth once daily  triamcinolone 0.1 % cream APPLY TOPICALLY TO THE AFFECTED AREA TWICE DAILY FOR 15 DAYS AS NEEDED FOR ITCHING for 15   No current facility-administered medications on file prior to visit.   Family History  Problem Relation Age of Onset  Diabetes Mother  Diabetes Father  High blood pressure (Hypertension) Sister  Diabetes Sister    Social History   Tobacco Use  Smoking Status Former  Types: Cigarettes  Smokeless Tobacco Never    Social History   Socioeconomic History  Marital status:  Divorced  Tobacco Use  Smoking status: Former  Types: Cigarettes  Smokeless tobacco: Never  Vaping Use  Vaping status: Never Used  Substance and Sexual Activity  Alcohol use: Yes  Drug use: Defer   Objective:   Vitals:  08/06/23 0926 08/06/23 0933  BP: 112/74  Pulse: 71  Temp: 36.4 C (97.6 F)  SpO2: 99%  Weight: 86.9 kg  (191 lb 9.6 oz)  Height: 160 cm (5' 3)  PainSc: 0-No pain   Body mass index is 33.94 kg/m.  Physical Exam Exam conducted with a chaperone present.  Cardiovascular:  Rate and Rhythm: Normal rate.  Pulmonary:  Effort: Pulmonary effort is normal.  Chest:  Breasts: Right: Normal.  Left: Normal.  Lymphadenopathy:  Upper Body:  Right upper body: No supraclavicular or axillary adenopathy.  Left upper body: No supraclavicular or axillary adenopathy.  Neurological:  General: No focal deficit present.  Mental Status: She is alert.  Psychiatric:  Mood and Affect: Mood normal.     Labs, Imaging and Diagnostic Testing:  FINAL DIAGNOSIS   1. Breast, right, needle core biopsy, 8:00 (heart clip) :  - INTRADUCTAL PAPILLOMA WITH FOCAL ATYPIA  - NO MALIGNANCY IDENTIFIED   Diagnosis Note : - Dr. Rebbecca agrees.  CLINICAL DATA: Spontaneous RIGHT nipple discharge. Longstanding  deep RIGHT breast pain.   EXAM:  DIGITAL DIAGNOSTIC BILATERAL MAMMOGRAM WITH TOMOSYNTHESIS AND CAD;  ULTRASOUND RIGHT BREAST LIMITED   TECHNIQUE:  Bilateral digital diagnostic mammography and breast tomosynthesis  was performed. The images were evaluated with computer-aided  detection. ; Targeted ultrasound examination of the right breast was  performed Best images possible per technologist communication. Best  images possible per technologist communication.   COMPARISON: Previous exam(s).   ACR Breast Density Category b: There are scattered areas of  fibroglandular density.   FINDINGS:  Spot compression tomosynthesis views were obtained of the RIGHT  retroareolar breast. There is global increase in a tubular asymmetry  in the RIGHT outer breast most consistent with duct ectasia. No  additional suspicious findings are noted. No suspicious mass,  distortion, or microcalcifications are identified to suggest  presence of malignancy in the LEFT breast.   On physical exam, no suspicious mass is  appreciated.   Targeted ultrasound was performed of the RIGHT retroareolar breast.  There is duct ectasia noted throughout the 3 o'clock axis. There is  a intraductal mass noted at 8 o'clock in the retroareolar breast  approximately 3 cm from the nipple. It measures approximately 3 x 3  x 5 mm.   Targeted ultrasound was performed the RIGHT axilla. No suspicious  axillary lymph nodes are visualized.   IMPRESSION:  1. There is a 5 mm intraductal mass in the RIGHT breast. This may  reflect the etiology for new spontaneous nipple discharge. Recommend  ultrasound-guided biopsy for definitive characterization.  2. No mammographic evidence of malignancy in the LEFT breast.  3. No suspicious RIGHT axillary adenopathy.  4. Breast pain is a common condition, which will often resolve on  its own without intervention. It can be affected by hormonal  changes, medication side effect, weight changes and fit of the bra.  Pain may also be referred from other adjacent areas of the body.  Breast pain may be improved by wearing adequate well-fitting  support, over-the-counter topical and oral NSAID medication, low-fat  diet, and ice/heat as needed. Studies have shown an improvement in  cyclic pain with use of evening primrose oil, vitamin D and vitamin  E. Clinical follow-up  recommended to discuss any further work-up  recommendations and appropriate treatment.   RECOMMENDATION:  RIGHT breast ultrasound-guided biopsy x1   I have discussed the findings and recommendations with the patient.  The biopsy procedure was discussed with the patient and questions  were answered. Patient expressed their understanding of the biopsy  recommendation. Patient will be scheduled for biopsy at her earliest  convenience by the schedulers. Ordering provider will be notified.  If applicable, a reminder letter will be sent to the patient  regarding the next appointment.   BI-RADS CATEGORY 4: Suspicious.   Assessment  and Plan:   Diagnoses and all orders for this visit:  Papilloma of right breast   Recommend right breast lumpectomy seed localized for atypical papilloma. Potential upgrade risk of 25%.The procedure has been discussed with the patient. Alternatives to surgery have been discussed with the patient. Risks of surgery include bleeding, Infection, Seroma formation, death, and the need for further surgery. The patient understands and wishes to proceed.    DEBBY CURTISTINE SHIPPER, MD

## 2023-10-26 ENCOUNTER — Ambulatory Visit
Admission: RE | Admit: 2023-10-26 | Discharge: 2023-10-26 | Disposition: A | Discharge status: Home / Self Care | Source: Ambulatory Visit | Attending: Surgery | Admitting: Surgery

## 2023-10-26 DIAGNOSIS — D242 Benign neoplasm of left breast: Secondary | ICD-10-CM

## 2023-10-26 HISTORY — PX: BREAST BIOPSY: SHX20

## 2023-10-27 ENCOUNTER — Ambulatory Visit (HOSPITAL_COMMUNITY)
Admission: RE | Admit: 2023-10-27 | Discharge: 2023-10-27 | Disposition: A | Discharge status: Home / Self Care | Attending: Surgery | Admitting: Surgery

## 2023-10-27 ENCOUNTER — Ambulatory Visit
Admission: RE | Admit: 2023-10-27 | Discharge: 2023-10-27 | Disposition: A | Discharge status: Home / Self Care | Source: Ambulatory Visit | Attending: Surgery | Admitting: Surgery

## 2023-10-27 ENCOUNTER — Other Ambulatory Visit: Payer: Self-pay

## 2023-10-27 ENCOUNTER — Ambulatory Visit (HOSPITAL_BASED_OUTPATIENT_CLINIC_OR_DEPARTMENT_OTHER): Admitting: Anesthesiology

## 2023-10-27 ENCOUNTER — Encounter (HOSPITAL_COMMUNITY): Payer: Self-pay | Admitting: Surgery

## 2023-10-27 ENCOUNTER — Ambulatory Visit (HOSPITAL_COMMUNITY): Admitting: Vascular Surgery

## 2023-10-27 ENCOUNTER — Encounter (HOSPITAL_COMMUNITY)
Admission: RE | Disposition: A | Discharge status: Home / Self Care | Payer: Self-pay | Source: Home / Self Care | Attending: Surgery

## 2023-10-27 DIAGNOSIS — Z8619 Personal history of other infectious and parasitic diseases: Secondary | ICD-10-CM | POA: Insufficient documentation

## 2023-10-27 DIAGNOSIS — I071 Rheumatic tricuspid insufficiency: Secondary | ICD-10-CM | POA: Diagnosis not present

## 2023-10-27 DIAGNOSIS — D241 Benign neoplasm of right breast: Secondary | ICD-10-CM | POA: Insufficient documentation

## 2023-10-27 DIAGNOSIS — J45909 Unspecified asthma, uncomplicated: Secondary | ICD-10-CM | POA: Diagnosis not present

## 2023-10-27 DIAGNOSIS — K219 Gastro-esophageal reflux disease without esophagitis: Secondary | ICD-10-CM | POA: Insufficient documentation

## 2023-10-27 DIAGNOSIS — Z833 Family history of diabetes mellitus: Secondary | ICD-10-CM | POA: Diagnosis not present

## 2023-10-27 DIAGNOSIS — I10 Essential (primary) hypertension: Secondary | ICD-10-CM | POA: Diagnosis not present

## 2023-10-27 DIAGNOSIS — I4891 Unspecified atrial fibrillation: Secondary | ICD-10-CM

## 2023-10-27 DIAGNOSIS — G4730 Sleep apnea, unspecified: Secondary | ICD-10-CM | POA: Diagnosis not present

## 2023-10-27 DIAGNOSIS — Z794 Long term (current) use of insulin: Secondary | ICD-10-CM | POA: Insufficient documentation

## 2023-10-27 DIAGNOSIS — N6011 Diffuse cystic mastopathy of right breast: Secondary | ICD-10-CM | POA: Diagnosis not present

## 2023-10-27 DIAGNOSIS — I129 Hypertensive chronic kidney disease with stage 1 through stage 4 chronic kidney disease, or unspecified chronic kidney disease: Secondary | ICD-10-CM

## 2023-10-27 DIAGNOSIS — N6489 Other specified disorders of breast: Secondary | ICD-10-CM | POA: Diagnosis not present

## 2023-10-27 DIAGNOSIS — Z87891 Personal history of nicotine dependence: Secondary | ICD-10-CM | POA: Insufficient documentation

## 2023-10-27 DIAGNOSIS — E039 Hypothyroidism, unspecified: Secondary | ICD-10-CM | POA: Diagnosis not present

## 2023-10-27 DIAGNOSIS — F32A Depression, unspecified: Secondary | ICD-10-CM | POA: Diagnosis not present

## 2023-10-27 DIAGNOSIS — D242 Benign neoplasm of left breast: Secondary | ICD-10-CM

## 2023-10-27 DIAGNOSIS — Z7901 Long term (current) use of anticoagulants: Secondary | ICD-10-CM | POA: Insufficient documentation

## 2023-10-27 DIAGNOSIS — Z7984 Long term (current) use of oral hypoglycemic drugs: Secondary | ICD-10-CM | POA: Diagnosis not present

## 2023-10-27 DIAGNOSIS — E119 Type 2 diabetes mellitus without complications: Secondary | ICD-10-CM

## 2023-10-27 DIAGNOSIS — Z79899 Other long term (current) drug therapy: Secondary | ICD-10-CM | POA: Insufficient documentation

## 2023-10-27 DIAGNOSIS — Z8249 Family history of ischemic heart disease and other diseases of the circulatory system: Secondary | ICD-10-CM | POA: Diagnosis not present

## 2023-10-27 DIAGNOSIS — K7460 Unspecified cirrhosis of liver: Secondary | ICD-10-CM | POA: Diagnosis not present

## 2023-10-27 DIAGNOSIS — N1831 Chronic kidney disease, stage 3a: Secondary | ICD-10-CM

## 2023-10-27 DIAGNOSIS — M1990 Unspecified osteoarthritis, unspecified site: Secondary | ICD-10-CM | POA: Insufficient documentation

## 2023-10-27 DIAGNOSIS — E1165 Type 2 diabetes mellitus with hyperglycemia: Secondary | ICD-10-CM | POA: Insufficient documentation

## 2023-10-27 DIAGNOSIS — N289 Disorder of kidney and ureter, unspecified: Secondary | ICD-10-CM | POA: Diagnosis not present

## 2023-10-27 HISTORY — PX: BREAST LUMPECTOMY WITH RADIOACTIVE SEED LOCALIZATION: SHX6424

## 2023-10-27 LAB — GLUCOSE, CAPILLARY
Glucose-Capillary: 206 mg/dL — ABNORMAL HIGH (ref 70–99)
Glucose-Capillary: 158 mg/dL — ABNORMAL HIGH (ref 70–99)

## 2023-10-27 SURGERY — BREAST LUMPECTOMY WITH RADIOACTIVE SEED LOCALIZATION
Anesthesia: General | Site: Breast | Laterality: Right

## 2023-10-27 MED ORDER — HYDROMORPHONE HCL 1 MG/ML IJ SOLN: 0.2500 mg | INTRAMUSCULAR | Status: DC | PRN

## 2023-10-27 MED ORDER — TRAMADOL HCL 50 MG PO TABS: 50.0000 mg | ORAL_TABLET | Freq: Four times a day (QID) | ORAL | 0 refills | Status: AC | PRN

## 2023-10-27 MED ORDER — PROPOFOL 10 MG/ML IV BOLUS
INTRAVENOUS | Status: DC | PRN
  Administered 2023-10-27: 150 mg via INTRAVENOUS
  Administered 2023-10-27: 50 mg via INTRAVENOUS

## 2023-10-27 MED ORDER — ONDANSETRON HCL 4 MG/2ML IJ SOLN
4.0000 mg | Freq: Once | INTRAMUSCULAR | Status: AC | PRN
  Administered 2023-10-27: 4 mg via INTRAVENOUS

## 2023-10-27 MED ORDER — BUPIVACAINE-EPINEPHRINE 0.25% -1:200000 IJ SOLN
INTRAMUSCULAR | Status: DC | PRN
  Administered 2023-10-27: 20 mL

## 2023-10-27 MED ORDER — ONDANSETRON HCL 4 MG/2ML IJ SOLN: 4.0000 mg | Freq: Once | INTRAMUSCULAR | Status: DC

## 2023-10-27 MED ORDER — INSULIN ASPART 100 UNIT/ML IJ SOLN
0.0000 [IU] | INTRAMUSCULAR | Status: DC | PRN
  Administered 2023-10-27: 2 [IU] via SUBCUTANEOUS
  Filled 2023-10-27: qty 1

## 2023-10-27 MED ORDER — SUCCINYLCHOLINE CHLORIDE 200 MG/10ML IV SOSY
PREFILLED_SYRINGE | INTRAVENOUS | Status: DC | PRN
  Administered 2023-10-27: 40 mg via INTRAVENOUS

## 2023-10-27 MED ORDER — ONDANSETRON HCL 4 MG/2ML IJ SOLN
INTRAMUSCULAR | Status: DC | PRN
  Administered 2023-10-27: 4 mg via INTRAVENOUS

## 2023-10-27 MED ORDER — HYDROMORPHONE HCL 1 MG/ML IJ SOLN
INTRAMUSCULAR | Status: DC | PRN
  Administered 2023-10-27 (×2): .5 mg via INTRAVENOUS

## 2023-10-27 MED ORDER — FENTANYL CITRATE (PF) 250 MCG/5ML IJ SOLN
INTRAMUSCULAR | Status: AC
  Filled 2023-10-27: qty 5

## 2023-10-27 MED ORDER — HYDROMORPHONE HCL 1 MG/ML IJ SOLN
INTRAMUSCULAR | Status: AC
  Filled 2023-10-27: qty 0.5

## 2023-10-27 MED ORDER — SODIUM CHLORIDE 0.9 % IV SOLN: INTRAVENOUS | Status: DC | PRN

## 2023-10-27 MED ORDER — SUGAMMADEX SODIUM 200 MG/2ML IV SOLN
INTRAVENOUS | Status: DC | PRN
  Administered 2023-10-27: 200 mg via INTRAVENOUS

## 2023-10-27 MED ORDER — BUPIVACAINE-EPINEPHRINE (PF) 0.25% -1:200000 IJ SOLN
INTRAMUSCULAR | Status: AC
  Filled 2023-10-27: qty 30

## 2023-10-27 MED ORDER — CHLORHEXIDINE GLUCONATE CLOTH 2 % EX PADS: 6.0000 | MEDICATED_PAD | Freq: Once | CUTANEOUS | Status: DC

## 2023-10-27 MED ORDER — OXYCODONE HCL 5 MG/5ML PO SOLN: 5.0000 mg | Freq: Once | ORAL | Status: DC | PRN

## 2023-10-27 MED ORDER — LIDOCAINE 2% (20 MG/ML) 5 ML SYRINGE
INTRAMUSCULAR | Status: DC | PRN
  Administered 2023-10-27: 80 mg via INTRAVENOUS

## 2023-10-27 MED ORDER — 0.9 % SODIUM CHLORIDE (POUR BTL) OPTIME
TOPICAL | Status: DC | PRN
  Administered 2023-10-27: 1000 mL

## 2023-10-27 MED ORDER — PROPOFOL 10 MG/ML IV BOLUS
INTRAVENOUS | Status: AC
  Filled 2023-10-27: qty 20

## 2023-10-27 MED ORDER — CHLORHEXIDINE GLUCONATE 0.12 % MT SOLN
15.0000 mL | Freq: Once | OROMUCOSAL | Status: AC
  Administered 2023-10-27: 15 mL via OROMUCOSAL
  Filled 2023-10-27: qty 15

## 2023-10-27 MED ORDER — CLINDAMYCIN PHOSPHATE 900 MG/50ML IV SOLN
900.0000 mg | INTRAVENOUS | Status: AC
  Administered 2023-10-27: 900 mg via INTRAVENOUS
  Filled 2023-10-27: qty 50

## 2023-10-27 MED ORDER — ROCURONIUM BROMIDE 10 MG/ML (PF) SYRINGE
PREFILLED_SYRINGE | INTRAVENOUS | Status: DC | PRN
  Administered 2023-10-27: 50 mg via INTRAVENOUS

## 2023-10-27 MED ORDER — OXYCODONE HCL 5 MG PO TABS: 5.0000 mg | ORAL_TABLET | Freq: Once | ORAL | Status: DC | PRN

## 2023-10-27 MED ORDER — ONDANSETRON HCL 4 MG/2ML IJ SOLN
INTRAMUSCULAR | Status: AC
  Filled 2023-10-27: qty 2

## 2023-10-27 MED ORDER — LACTATED RINGERS IV SOLN: INTRAVENOUS | Status: DC

## 2023-10-27 MED ORDER — ORAL CARE MOUTH RINSE: 15.0000 mL | Freq: Once | OROMUCOSAL | Status: AC

## 2023-10-27 SURGICAL SUPPLY — 27 items
BINDER BREAST XLRG (GAUZE/BANDAGES/DRESSINGS) IMPLANT
CANISTER SUCTION 3000ML PPV (SUCTIONS) IMPLANT
CHLORAPREP W/TINT 26 (MISCELLANEOUS) ×1 IMPLANT
CLIP APPLIE 9.375 MED OPEN (MISCELLANEOUS) IMPLANT
COVER PROBE W GEL 5X96 (DRAPES) ×1 IMPLANT
COVER SURGICAL LIGHT HANDLE (MISCELLANEOUS) ×1 IMPLANT
DERMABOND ADVANCED .7 DNX12 (GAUZE/BANDAGES/DRESSINGS) ×1 IMPLANT
DEVICE DUBIN SPECIMEN MAMMOGRA (MISCELLANEOUS) ×1 IMPLANT
DRAPE CHEST BREAST 15X10 FENES (DRAPES) ×1 IMPLANT
ELECT CAUTERY BLADE 6.4 (BLADE) ×1 IMPLANT
ELECTRODE REM PT RTRN 9FT ADLT (ELECTROSURGICAL) ×1 IMPLANT
GLOVE BIO SURGEON STRL SZ8 (GLOVE) ×1 IMPLANT
GLOVE BIOGEL PI IND STRL 8 (GLOVE) ×1 IMPLANT
GOWN STRL REUS W/ TWL LRG LVL3 (GOWN DISPOSABLE) ×1 IMPLANT
GOWN STRL REUS W/ TWL XL LVL3 (GOWN DISPOSABLE) ×1 IMPLANT
KIT BASIN OR (CUSTOM PROCEDURE TRAY) ×1 IMPLANT
KIT MARKER MARGIN INK (KITS) ×1 IMPLANT
NDL HYPO 25GX1X1/2 BEV (NEEDLE) ×1 IMPLANT
NEEDLE HYPO 25GX1X1/2 BEV (NEEDLE) ×1 IMPLANT
NS IRRIG 1000ML POUR BTL (IV SOLUTION) IMPLANT
PACK GENERAL/GYN (CUSTOM PROCEDURE TRAY) ×1 IMPLANT
SUT MNCRL AB 4-0 PS2 18 (SUTURE) ×1 IMPLANT
SUT SILK 2 0 SH (SUTURE) IMPLANT
SUT VIC AB 2-0 SH 27XBRD (SUTURE) IMPLANT
SUT VIC AB 3-0 SH 8-18 (SUTURE) ×1 IMPLANT
SYR CONTROL 10ML LL (SYRINGE) ×1 IMPLANT
TOWEL GREEN STERILE FF (TOWEL DISPOSABLE) IMPLANT

## 2023-10-27 NOTE — Transfer of Care (Signed)
 Immediate Anesthesia Transfer of Care Note  Patient: Jaime Boone  Procedure(s) Performed: BREAST LUMPECTOMY WITH RADIOACTIVE SEED LOCALIZATION (Right: Breast)  Patient Location: PACU  Anesthesia Type:General  Level of Consciousness: awake, alert , and patient cooperative  Airway & Oxygen Therapy: Patient Spontanous Breathing and Patient connected to face mask oxygen  Post-op Assessment: Report given to RN, Post -op Vital signs reviewed and stable, Patient moving all extremities, and Patient moving all extremities X 4  Post vital signs: Reviewed and stable    Last Vitals:  Vitals Value Taken Time  BP 127/76 10/27/23 08:48  Temp 97.4 10/27/23 0:52  Pulse 76 10/27/23 08:52  Resp 16 10/27/23 08:52  SpO2 99 % 10/27/23 08:52  Vitals shown include unfiled device data.  Last Pain:  Vitals:   10/27/23 0621  TempSrc:   PainSc: 0-No pain         Complications: No notable events documented.

## 2023-10-27 NOTE — Op Note (Signed)
 Preoperative diagnosis: Right breast papilloma lower outer quadrant  Postoperative diagnosis: Same  Procedure: Right breast seed localized lumpectomy  Surgeon: Debby Shipper, MD  Anesthesia: LMA with 0.25% Marcaine  with epinephrine   EBL: Minimal  Specimen: Right breast tissue with seed and clip verified by Faxitron  Drains: None  Indications for procedure: The patient is an 83 year old female who was found to have a right breast papilloma.  Due to size criteria and concern for potential atypia, excision was recommended.The procedure has been discussed with the patient. Alternatives to surgery have been discussed with the patient.  Risks of surgery include bleeding,  Infection,  Seroma formation, death,  and the need for further surgery.   The patient understands and wishes to proceed.    Description of procedure: The patient was met in the holding area and all questions were answered.  The right breast was marked as the correct site and the seed was placed as an outpatient.  She was then taken back to the operative room placed supine upon the OR table.  After induction of general anesthesia, right breast was prepped and draped in sterile fashion and timeout performed.  Neoprobe used to identify the seed in the right lower outer quadrant.  A curvilinear incision was made of the signal.  Skin flaps were raised.  All tissue around the seed and clip were excised with a grossly negative margin.  Of note there was a small hematoma.  Imaging revealed seed and clip to be present the specimen and this was oriented with ink and sent to pathology.  The cavity was irrigated and local anesthetic infiltrated.  The deep tissue planes were approximated with 3-0 Vicryl.  4 Monocryl was used to close the skin in a subcuticular fashion.  Dermabond applied.  Breast binder placed.  All counts found to be correct.  The patient was awoke extubated taken to recovery in satisfactory condition.

## 2023-10-27 NOTE — Anesthesia Procedure Notes (Signed)
 Procedure Name: Intubation Date/Time: 10/27/2023 7:45 AM  Performed by: Arvell Edsel HERO, CRNAPre-anesthesia Checklist: Patient identified, Emergency Drugs available, Suction available, Patient being monitored and Timeout performed Patient Re-evaluated:Patient Re-evaluated prior to induction Oxygen Delivery Method: Circle system utilized Preoxygenation: Pre-oxygenation with 100% oxygen Induction Type: IV induction Laryngoscope Size: Mac and 3 Grade View: Grade II Tube size: 7.0 mm Number of attempts: 1 Airway Equipment and Method: Patient positioned with wedge pillow and Stylet Placement Confirmation: ETT inserted through vocal cords under direct vision, positive ETCO2, CO2 detector and breath sounds checked- equal and bilateral Secured at: 21 cm Tube secured with: Tape

## 2023-10-27 NOTE — Interval H&P Note (Signed)
 History and Physical Interval Note:  10/27/2023 7:05 AM  Jaime Boone  has presented today for surgery, with the diagnosis of RIGHT BREAST PAPILLOMA.  The various methods of treatment have been discussed with the patient and family. After consideration of risks, benefits and other options for treatment, the patient has consented to  Procedure(s): BREAST LUMPECTOMY WITH RADIOACTIVE SEED LOCALIZATION (Right) as a surgical intervention.  The patient's history has been reviewed, patient examined, no change in status, stable for surgery.  I have reviewed the patient's chart and labs.  Questions were answered to the patient's satisfaction.     Manali Mcelmurry A Jaslyn Bansal

## 2023-10-27 NOTE — Anesthesia Postprocedure Evaluation (Signed)
 Anesthesia Post Note  Patient: Jaime Boone  Procedure(s) Performed: BREAST LUMPECTOMY WITH RADIOACTIVE SEED LOCALIZATION (Right: Breast)     Patient location during evaluation: PACU Anesthesia Type: General Level of consciousness: awake and alert and oriented Pain management: pain level controlled Vital Signs Assessment: post-procedure vital signs reviewed and stable Respiratory status: spontaneous breathing, nonlabored ventilation and respiratory function stable Cardiovascular status: blood pressure returned to baseline and stable Anesthetic complications: no   No notable events documented.  Last Vitals:  Vitals:   10/27/23 0930 10/27/23 0945  BP: 127/74 127/70  Pulse: 66 63  Resp: 15 15  Temp:  (!) 36.3 C  SpO2: 98% 95%    Last Pain:  Vitals:   10/27/23 0945  TempSrc:   PainSc: 0-No pain                 Akeyla Molden A.

## 2023-10-27 NOTE — Discharge Instructions (Signed)
 [facility] Office Phone Number [phone]  BREAST BIOPSY/ PARTIAL MASTECTOMY: POST OP INSTRUCTIONS  Always review your discharge instruction sheet given to you by the facility where your surgery was performed.  IF YOU HAVE DISABILITY OR FAMILY LEAVE FORMS, YOU MUST BRING THEM TO THE OFFICE FOR PROCESSING.  DO NOT GIVE THEM TO YOUR DOCTOR.  A prescription for pain medication may be given to you upon discharge.  Take your pain medication as prescribed, if needed.  If narcotic pain medicine is not needed, then you may take acetaminophen (Tylenol) or ibuprofen (Advil) as needed. Take your usually prescribed medications unless otherwise directed If you need a refill on your pain medication, please contact your pharmacy.  They will contact our office to request authorization.  Prescriptions will not be filled after 5pm or on week-ends. You should eat very light the first 24 hours after surgery, such as soup, crackers, pudding, etc.  Resume your normal diet the day after surgery. Most patients will experience some swelling and bruising in the breast.  Ice packs and a good support bra will help.  Swelling and bruising can take several days to resolve.  It is common to experience some constipation if taking pain medication after surgery.  Increasing fluid intake and taking a stool softener will usually help or prevent this problem from occurring.  A mild laxative (Milk of Magnesia or Miralax) should be taken according to package directions if there are no bowel movements after 48 hours. Unless discharge instructions indicate otherwise, you may remove your bandages 24-48 hours after surgery, and you may shower at that time.  You may have steri-strips (small skin tapes) in place directly over the incision.  These strips should be left on the skin for 7-10 days.  If your surgeon used skin glue on the incision, you may shower in 24 hours.  The glue will flake off over the next 2-3 weeks.  Any  sutures or staples will be removed at the office during your follow-up visit. ACTIVITIES:  You may resume regular daily activities (gradually increasing) beginning the next day.  Wearing a good support bra or sports bra minimizes pain and swelling.  You may have sexual intercourse when it is comfortable. You may drive when you no longer are taking prescription pain medication, you can comfortably wear a seatbelt, and you can safely maneuver your car and apply brakes. RETURN TO WORK:  ______________________________________________________________________________________ Jaime Boone should see your doctor in the office for a follow-up appointment approximately two weeks after your surgery.  Your doctor's nurse will typically make your follow-up appointment when she calls you with your pathology report.  Expect your pathology report 2-3 business days after your surgery.  You may call to check if you do not hear from us  after three days. OTHER INSTRUCTIONS: _______________________________________________________________________________________________ _____________________________________________________________________________________________________________________________________ _____________________________________________________________________________________________________________________________________ _____________________________________________________________________________________________________________________________________  WHEN TO CALL YOUR DOCTOR: Fever over 101.0 Nausea and/or vomiting. Extreme swelling or bruising. Continued bleeding from incision. Increased pain, redness, or drainage from the incision.  The clinic staff is available to answer your questions during regular business hours.  Please don't hesitate to call and ask to speak to one of the nurses for clinical concerns.  If you have a medical emergency, go to the nearest emergency room or call 911.  A surgeon from [facility] is always on call at the hospital.  For further questions, please visit [email or web address]        RESTART ELIQUIS  IN 48 HOURS

## 2023-10-28 ENCOUNTER — Encounter (HOSPITAL_COMMUNITY): Payer: Self-pay | Admitting: Surgery

## 2023-10-28 ENCOUNTER — Telehealth: Payer: Self-pay

## 2023-10-28 NOTE — Telephone Encounter (Signed)
 Patient is not interested in CPAP Therapy. Patient wants to know what her options are. Message sent to [facility] to get patient scheduled for OV to discuss treatment options.

## 2023-10-28 NOTE — Telephone Encounter (Signed)
-----   Message from Wilbert Bihari sent at 10/22/2023  4:20 PM EDT ----- Please let patient know that they have sleep apnea.  Recommend therapeutic CPAP titration for treatment of patient's sleep disordered breathing.  ASAP

## 2023-10-29 LAB — SURGICAL PATHOLOGY

## 2024-02-02 ENCOUNTER — Ambulatory Visit (INDEPENDENT_AMBULATORY_CARE_PROVIDER_SITE_OTHER): Admitting: Podiatry

## 2024-02-02 ENCOUNTER — Encounter: Payer: Self-pay | Admitting: Podiatry

## 2024-02-02 DIAGNOSIS — B351 Tinea unguium: Secondary | ICD-10-CM

## 2024-02-02 DIAGNOSIS — E1142 Type 2 diabetes mellitus with diabetic polyneuropathy: Secondary | ICD-10-CM

## 2024-02-02 DIAGNOSIS — M79674 Pain in right toe(s): Secondary | ICD-10-CM

## 2024-02-09 NOTE — Progress Notes (Signed)
  Subjective:  Patient ID: Jaime Boone, female    DOB: 10-Mar-1941,  MRN: [id number]  [name] presents to clinic today for at risk foot care with history of diabetic neuropathy and painful mycotic toenails x 10 which interfere with daily activities. Pain is relieved with periodic professional debridement.  Chief Complaint  Patient presents with   Diabetes    DFC IDDM A1C 7.8 Toenail trim LOV with PCP 06/28/23.   New problem(s): None.   PCP is Avva, Ravisankar, MD.  Allergies  Allergen Reactions   Doxycycline Shortness Of Breath   Penicillins Anaphylaxis and Swelling    Throat swells    Prednisone Shortness Of Breath   Aspirin Other (See Comments)    Stomach pain   Ciprofloxacin  Nausea And Vomiting   Codeine Nausea And Vomiting   Duloxetine Hcl Nausea And Vomiting   Fentanyl  Other (See Comments)    lightheaded   Nitrofuran Derivatives Nausea And Vomiting   Versed [Midazolam] Other (See Comments)    lightheaded    Review of Systems: Negative except as noted in the HPI.  Objective: No changes noted in today's physical examination. There were no vitals filed for this visit. Jaime Boone is a pleasant 83 y.o. female in NAD. AAO x 3.  Vascular Examination: Capillary refill time immediate b/l. Palpable pedal pulses. Pedal hair absent b/l. Pedal edema absent. No pain with calf compression b/l. Skin temperature gradient WNL b/l. No cyanosis or clubbing b/l. No ischemia or gangrene noted b/l.   Neurological Examination: Sensation grossly intact b/l with 10 gram monofilament. Vibratory sensation intact b/l. Pt has subjective symptoms of neuropathy.  Dermatological Examination: Pedal skin with normal turgor, texture and tone b/l.  No open wounds. No interdigital macerations.   Toenails 1-5 b/l thick, discolored, elongated with subungual debris and pain on dorsal palpation.   No corns, calluses, nor porokeratotic lesions.  Musculoskeletal Examination: Muscle strength  5/5 to all lower extremity muscle groups bilaterally. Dorsal bunion right foot.  Radiographs: None  Assessment/Plan: 1. Pain due to onychomycosis of toenail of right foot   2. Diabetic peripheral neuropathy associated with type 2 diabetes mellitus [facility])    Consent given for treatment. Patient examined. All patient's and/or POA's questions/concerns addressed on today's visit. Toenails 1-5 b/l debrided in length and girth without incident. Continue foot and shoe inspections daily. Monitor blood glucose per PCP/Endocrinologist's recommendations. Continue soft, supportive shoe gear daily. Report any pedal injuries to medical professional. Call office if there are any questions/concerns. -Patient/POA to call should there be question/concern in the interim.   Return in about 3 months (around 05/04/2024).  [name], DPM      [facility] LOCATION: [address] [place] [address]                   Office [phone]   [facility] LOCATION: [address] [place], [place] [address] Office [phone]

## 2024-04-16 ENCOUNTER — Other Ambulatory Visit (HOSPITAL_COMMUNITY): Payer: Self-pay | Admitting: Physician Assistant
